# Patient Record
Sex: Female | Born: 1950 | Race: Black or African American | Hispanic: No | Smoking: Current every day smoker
Health system: Southern US, Community
[De-identification: ages and names within clinical notes are randomized; demographics above are authoritative.]

---

## 2019-11-18 ENCOUNTER — Other Ambulatory Visit: Payer: Self-pay | Admitting: Family Medicine

## 2019-11-18 DIAGNOSIS — R921 Mammographic calcification found on diagnostic imaging of breast: Secondary | ICD-10-CM

## 2020-01-01 ENCOUNTER — Encounter (HOSPITAL_BASED_OUTPATIENT_CLINIC_OR_DEPARTMENT_OTHER): Payer: Medicare Other | Attending: Internal Medicine | Admitting: Internal Medicine

## 2020-01-01 DIAGNOSIS — E1142 Type 2 diabetes mellitus with diabetic polyneuropathy: Secondary | ICD-10-CM | POA: Diagnosis not present

## 2020-01-01 DIAGNOSIS — L97229 Non-pressure chronic ulcer of left calf with unspecified severity: Secondary | ICD-10-CM | POA: Insufficient documentation

## 2020-01-01 DIAGNOSIS — E1151 Type 2 diabetes mellitus with diabetic peripheral angiopathy without gangrene: Secondary | ICD-10-CM | POA: Insufficient documentation

## 2020-01-01 DIAGNOSIS — I5032 Chronic diastolic (congestive) heart failure: Secondary | ICD-10-CM | POA: Insufficient documentation

## 2020-01-01 DIAGNOSIS — F17200 Nicotine dependence, unspecified, uncomplicated: Secondary | ICD-10-CM | POA: Insufficient documentation

## 2020-01-01 DIAGNOSIS — L97212 Non-pressure chronic ulcer of right calf with fat layer exposed: Secondary | ICD-10-CM | POA: Diagnosis present

## 2020-01-01 DIAGNOSIS — J449 Chronic obstructive pulmonary disease, unspecified: Secondary | ICD-10-CM | POA: Insufficient documentation

## 2020-01-01 DIAGNOSIS — I110 Hypertensive heart disease with heart failure: Secondary | ICD-10-CM | POA: Insufficient documentation

## 2020-01-01 NOTE — Progress Notes (Signed)
XOE, HOE ([id number]) Visit Report for 01/01/2020 Abuse/Suicide Risk Screen Details Patient Name: Date of Service: Patricia [name], Patricia [name] 01/01/2020 2:45 PM Medical Record Number: [id number] Patient Account Number: [id number] Date of Birth/Sex: Treating RN: [birth year] (69 y.o. Harvest Dark Primary Care Jennier Schissler: Other Clinician: Referring Dariela Stoker: Treating Tayler Lassen/Extender: Maryla Morrow in Treatment: 0 Abuse/Suicide Risk Screen Items Answer ABUSE RISK SCREEN: Has anyone close to you tried to hurt or harm you recentlyo No Do you feel uncomfortable with anyone in your familyo No Has anyone forced you do things that you didnt want to doo No Electronic Signature(s) Signed: 01/01/2020 5:05:13 PM By: Cherylin Mylar Entered By: Cherylin Mylar on 01/01/2020 15:25:21 -------------------------------------------------------------------------------- Activities of Daily Living Details Patient Name: Date of Service: Patricia [name], Patricia [name] 01/01/2020 2:45 PM Medical Record Number: [id number] Patient Account Number: [id number] Date of Birth/Sex: Treating RN: 02-04-1951 (69 y.o. Harvest Dark Primary Care Ulice Follett: Other Clinician: Referring Demarko Zeimet: Treating Ahtziri Jeffries/Extender: Maryla Morrow in Treatment: 0 Activities of Daily Living Items Answer Activities of Daily Living (Please select one for each item) Drive Automobile Not Able T Medications ake Completely Able Use T elephone Completely Able Care for Appearance Completely Able Use T oilet Completely Able Bath / Shower Completely Able Dress Self Completely Able Feed Self Completely Able Walk Completely Able Get In / Out Bed Completely Able Housework Completely Able Prepare Meals Completely Able Handle Money Completely Able Shop for Self Completely Able Electronic Signature(s) Signed: 01/01/2020 5:05:13 PM By: Cherylin Mylar Entered By: Cherylin Mylar on 01/01/2020  15:25:49 -------------------------------------------------------------------------------- Education Screening Details Patient Name: Date of Service: Patricia [name], Patricia [name] 01/01/2020 2:45 PM Medical Record Number: [id number] Patient Account Number: [id number] Date of Birth/Sex: Treating RN: 11/03/1950 (69 y.o. F) Cherylin Mylar Primary Care Kayleeann Huxford: Other Clinician: Referring Brittane Grudzinski: Treating Rasmus Preusser/Extender: Maryla Morrow in Treatment: 0 Primary Learner Assessed: Patient Learning Preferences/Education Level/Primary Language Learning Preference: Explanation Preferred Language: English Cognitive Barrier Language Barrier: No Translator Needed: No Memory Deficit: No Emotional Barrier: No Cultural/Religious Beliefs Affecting Medical Care: No Physical Barrier Impaired Vision: No Impaired Hearing: No Decreased Hand dexterity: No Knowledge/Comprehension Knowledge Level: Medium Comprehension Level: Medium Ability to understand written instructions: Medium Ability to understand verbal instructions: Medium Motivation Anxiety Level: Calm Cooperation: Cooperative Education Importance: Acknowledges Need Interest in Health Problems: Asks Questions Perception: Coherent Willingness to Engage in Self-Management High Activities: Readiness to Engage in Self-Management High Activities: Electronic Signature(s) Signed: 01/01/2020 5:05:13 PM By: Cherylin Mylar Entered By: Cherylin Mylar on 01/01/2020 15:26:08 -------------------------------------------------------------------------------- Fall Risk Assessment Details Patient Name: Date of Service: Patricia [name] 01/01/2020 2:45 PM Medical Record Number: [id number] Patient Account Number: [id number] Date of Birth/Sex: Treating RN: November 06, 1950 (69 y.o. F) Dwiggins, Carollee Herter Primary Care Timothee Gali: Other Clinician: Referring Feather Berrie: Treating Melven Stockard/Extender: Maryla Morrow in Treatment: 0 Fall Risk Assessment  Items Have you had 2 or more falls in the last 12 monthso 0 Yes Have you had any fall that resulted in injury in the last 12 monthso 0 Yes FALLS RISK SCREEN History of falling - immediate or within 3 months 25 Yes Secondary diagnosis (Do you have 2 or more medical diagnoseso) 15 Yes Ambulatory aid None/bed rest/wheelchair/nurse 0 No Crutches/cane/walker 15 Yes Furniture 0 No Intravenous therapy Access/Saline/Heparin Lock 0 No Gait/Transferring Normal/ bed rest/ wheelchair 0 No Weak (short steps with or without shuffle, stooped but able to lift head while walking, may seek 10 Yes support from furniture) Impaired (short steps with shuffle, may have difficulty arising from chair, head down, impaired 0 No  balance) Mental Status Oriented to own ability 0 Yes Electronic Signature(s) Signed: 01/01/2020 5:05:13 PM By: Cherylin Mylar Entered By: Cherylin Mylar on 01/01/2020 15:26:48 -------------------------------------------------------------------------------- Foot Assessment Details Patient Name: Date of Service: Patricia [name], Patricia [name] 01/01/2020 2:45 PM Medical Record Number: [id number] Patient Account Number: [id number] Date of Birth/Sex: Treating RN: 07-27-1950 (69 y.o. Harvest Dark Primary Care Markiah Janeway: Other Clinician: Referring Revanth Neidig: Treating Clarissa Laird/Extender: Maryla Morrow in Treatment: 0 Foot Assessment Items Site Locations + = Sensation present, - = Sensation absent, C = Callus, U = Ulcer R = Redness, W = Warmth, M = Maceration, PU = Pre-ulcerative lesion F = Fissure, S = Swelling, D = Dryness Assessment Right: Left: Other Deformity: No No Prior Foot Ulcer: No No Prior Amputation: No No Charcot Joint: No No Ambulatory Status: Ambulatory With Help Assistance Device: Walker Gait: Surveyor, mining) Signed: 01/01/2020 5:05:13 PM By: Cherylin Mylar Entered By: Cherylin Mylar on 01/01/2020  15:27:28 -------------------------------------------------------------------------------- Nutrition Risk Screening Details Patient Name: Date of Service: Patricia [name], Patricia [name] 01/01/2020 2:45 PM Medical Record Number: [id number] Patient Account Number: [id number] Date of Birth/Sex: Treating RN: June 14, 1950 (69 y.o. F) Dwiggins, Carollee Herter Primary Care Navid Lenzen: Other Clinician: Referring Talonda Artist: Treating Dontrez Pettis/Extender: Maryla Morrow in Treatment: 0 Height (in): 64 Weight (lbs): 172 Body Mass Index (BMI): 29.5 Nutrition Risk Screening Items Score Screening NUTRITION RISK SCREEN: I have an illness or condition that made me change the kind and/or amount of food I eat 0 No I eat fewer than two meals per day 0 No I eat few fruits and vegetables, or milk products 0 No I have three or more drinks of beer, liquor or wine almost every day 0 No I have tooth or mouth problems that make it hard for me to eat 0 No I don't always have enough money to buy the food I need 0 No I eat alone most of the time 0 No I take three or more different prescribed or over-the-counter drugs a day 0 No Without wanting to, I have lost or gained 10 pounds in the last six months 0 No I am not always physically able to shop, cook and/or feed myself 0 No Nutrition Protocols Good Risk Protocol Moderate Risk Protocol High Risk Proctocol Risk Level: Good Risk Score: 0 Electronic Signature(s) Signed: 01/01/2020 5:05:13 PM By: Cherylin Mylar Entered By: Cherylin Mylar on 01/01/2020 15:26:55

## 2020-01-02 NOTE — Progress Notes (Signed)
Patricia [name], Patricia [name] ([id number]) Visit Report for 01/01/2020 Chief Complaint Document Details Patient Name: Date of Service: Patricia [name], Patricia [name] 01/01/2020 2:45 PM Medical Record Number: [id number] Patient Account Number: [id number] Date of Birth/Sex: Treating RN: Jul 10, 1950 (69 y.o. Wynelle Link Primary Care Provider: Other Clinician: Referring Provider: Treating Provider/Extender: Maryla Morrow in Treatment: 0 Information Obtained from: Patient Chief Complaint Right LE wound x 1 year, worse 3 x new wounds b/l LE x 3 months Electronic Signature(s) Signed: 01/01/2020 4:38:17 PM By: Cassandria Anger MD, MBA Entered By: Cassandria Anger on 01/01/2020 16:38:16 -------------------------------------------------------------------------------- Debridement Details Patient Name: Date of Service: Patricia [name], Patricia [name] 01/01/2020 2:45 PM Medical Record Number: [id number] Patient Account Number: [id number] Date of Birth/Sex: Treating RN: 17-Oct-1950 (69 y.o. Wynelle Link Primary Care Provider: Other Clinician: Referring Provider: Treating Provider/Extender: Maryla Morrow in Treatment: 0 Debridement Performed for Assessment: Wound #4 Right,Medial Lower Leg Performed By: Physician Cassandria Anger, MD Debridement Type: Debridement Severity of Tissue Pre Debridement: Fat layer exposed Level of Consciousness (Pre-procedure): Awake and Alert Pre-procedure Verification/Time Out Yes - 16:25 Taken: Start Time: 16:25 T Area Debrided (L x W): otal 2.4 (cm) x 1.9 (cm) = 4.56 (cm) Tissue and other material debrided: Non-Viable, Eschar, Slough, Slough Level: Non-Viable Tissue Debridement Description: Selective/Open Wound Instrument: Blade, Forceps Bleeding: None End Time: 16:28 Procedural Pain: 4 Post Procedural Pain: 2 Response to Treatment: Procedure was tolerated well Level of Consciousness (Post- Awake and Alert procedure): Post Debridement Measurements of Total Wound Length:  (cm) 2.4 Width: (cm) 1.9 Depth: (cm) 0.9 Volume: (cm) 3.223 Character of Wound/Ulcer Post Debridement: Requires Further Debridement Severity of Tissue Post Debridement: Fat layer exposed Post Procedure Diagnosis Same as Pre-procedure Electronic Signature(s) Signed: 01/01/2020 5:03:09 PM By: Cassandria Anger MD, MBA Signed: 01/02/2020 5:19:54 PM By: Zandra Abts RN, BSN Entered By: Zandra Abts on 01/01/2020 16:29:05 -------------------------------------------------------------------------------- Debridement Details Patient Name: Date of Service: Patricia [name], Patricia [name] 01/01/2020 2:45 PM Medical Record Number: [id number] Patient Account Number: [id number] Date of Birth/Sex: Treating RN: [birth year] (69 y.o. Wynelle Link Primary Care Provider: Other Clinician: Referring Provider: Treating Provider/Extender: Maryla Morrow in Treatment: 0 Debridement Performed for Assessment: Wound #1 Left,Anterior Lower Leg Performed By: Clinician Zandra Abts, RN Debridement Type: Chemical/Enzymatic/Mechanical Agent Used: Santyl Severity of Tissue Pre Debridement: Fat layer exposed Level of Consciousness (Pre-procedure): Awake and Alert Pre-procedure Verification/Time Out No Taken: Bleeding: None Response to Treatment: Procedure was tolerated well Level of Consciousness (Post- Awake and Alert procedure): Post Debridement Measurements of Total Wound Length: (cm) 0.9 Width: (cm) 0.8 Depth: (cm) 0.2 Volume: (cm) 0.113 Character of Wound/Ulcer Post Debridement: Requires Further Debridement Severity of Tissue Post Debridement: Fat layer exposed Post Procedure Diagnosis Same as Pre-procedure Electronic Signature(s) Signed: 01/02/2020 9:39:52 AM By: Cassandria Anger MD, MBA Signed: 01/02/2020 5:19:54 PM By: Zandra Abts RN, BSN Entered By: Zandra Abts on 01/01/2020 17:06:51 -------------------------------------------------------------------------------- Debridement  Details Patient Name: Date of Service: Patricia [name], Patricia [name] 01/01/2020 2:45 PM Medical Record Number: [id number] Patient Account Number: [id number] Date of Birth/Sex: Treating RN: 20-Aug-1950 (69 y.o. Wynelle Link Primary Care Provider: Other Clinician: Referring Provider: Treating Provider/Extender: Maryla Morrow in Treatment: 0 Debridement Performed for Assessment: Wound #2 Right,Dorsal Foot Performed By: Clinician Zandra Abts, RN Debridement Type: Chemical/Enzymatic/Mechanical Agent Used: Santyl Severity of Tissue Pre Debridement: Fat layer exposed Level of Consciousness (Pre-procedure): Awake and Alert Pre-procedure Verification/Time Out No Taken: Bleeding: None Response to Treatment: Procedure was tolerated well Level of Consciousness (Post- Awake and Alert procedure): Post Debridement Measurements of  Total Wound Length: (cm) 0.9 Width: (cm) 0.9 Depth: (cm) 0.2 Volume: (cm) 0.127 Character of Wound/Ulcer Post Debridement: Requires Further Debridement Severity of Tissue Post Debridement: Fat layer exposed Post Procedure Diagnosis Same as Pre-procedure Electronic Signature(s) Signed: 01/02/2020 9:39:52 AM By: Cassandria Anger MD, MBA Signed: 01/02/2020 5:19:54 PM By: Zandra Abts RN, BSN Entered By: Zandra Abts on 01/01/2020 17:07:11 -------------------------------------------------------------------------------- Debridement Details Patient Name: Date of Service: Patricia [name], Patricia [name] 01/01/2020 2:45 PM Medical Record Number: [id number] Patient Account Number: [id number] Date of Birth/Sex: Treating RN: 1950-09-09 (69 y.o. Wynelle Link Primary Care Provider: Other Clinician: Referring Provider: Treating Provider/Extender: Maryla Morrow in Treatment: 0 Debridement Performed for Assessment: Wound #3 Right,Distal,Anterior Lower Leg Performed By: Clinician Zandra Abts, RN Debridement Type: Chemical/Enzymatic/Mechanical Agent Used:  Santyl Severity of Tissue Pre Debridement: Fat layer exposed Level of Consciousness (Pre-procedure): Awake and Alert Pre-procedure Verification/Time Out No Taken: Bleeding: None Response to Treatment: Procedure was tolerated well Level of Consciousness (Post- Awake and Alert procedure): Post Debridement Measurements of Total Wound Length: (cm) 0.5 Width: (cm) 0.8 Depth: (cm) 0.2 Volume: (cm) 0.063 Character of Wound/Ulcer Post Debridement: Requires Further Debridement Severity of Tissue Post Debridement: Fat layer exposed Post Procedure Diagnosis Same as Pre-procedure Electronic Signature(s) Signed: 01/02/2020 9:39:52 AM By: Cassandria Anger MD, MBA Signed: 01/02/2020 5:19:54 PM By: Zandra Abts RN, BSN Entered By: Zandra Abts on 01/01/2020 17:07:37 -------------------------------------------------------------------------------- HPI Details Patient Name: Date of Service: Patricia [name], Patricia [name] 01/01/2020 2:45 PM Medical Record Number: [id number] Patient Account Number: [id number] Date of Birth/Sex: Treating RN: Mar 13, 1951 (69 y.o. 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 Patient has most symptoms from the right medial calf wound with pain and discomfort. She has been putting Neosporin ointment on this wound and on the other wounds on the right and left legs. ABI in the clinic was unobtainable Patient's history significant for type 2 diabetes poorly controlled with the latest A1c of 11.8, ongoing  tobacco abuse, COPD-non-O2 dependent, CHF/chronic diastolic heart failure, Patient denies any fevers or chills, denies any other symptoms other than discomfort in the legs due to the wounds. Electronic Signature(s) Signed: 01/01/2020 4:40:28 PM By: Cassandria Anger MD, MBA Entered By: Cassandria Anger on 01/01/2020 16:40:28 -------------------------------------------------------------------------------- Physical Exam Details Patient Name: Date of Service: Patricia [name], Patricia [name] 01/01/2020 2:45 PM Medical Record Number: [id number] Patient Account Number: [id number] Date of Birth/Sex: Treating RN: January 11, 1951 (69 y.o. Wynelle Link Primary Care Provider: Other Clinician: Referring Provider: Treating Provider/Extender: Maryla Morrow in Treatment: 0 Constitutional alert and oriented x 3. sitting or standing blood pressure is within target range for patient.. supine blood pressure is within target range for patient.. pulse regular and within target range for patient.Marland Kitchen respirations regular, non-labored and within target range for patient.Marland Kitchen temperature within target range for patient.. . . Well- nourished and well-hydrated in no acute distress. Eyes conjunctiva clear no eyelid edema noted. pupils equal round and reactive to light and accommodation. Ears, Nose, Mouth, and Throat no gross abnormality of ear auricles or external auditory canals. normal hearing noted  during conversation. mucus membranes moist. Neck supple with no LAD noted in anterior or posterior cervical chain. not enlarged. Respiratory normal breathing without difficulty. clear to auscultation bilaterally. Cardiovascular regular rate and rhythm with normal S1, S2. no bruits with no significant JVD. 2+ femoral pulses. Poorly palpable pulses in either leg. no clubbing, cyanosis, significant edema, <3 sec cap refill. Gastrointestinal (GI) soft, non-tender, non-distended, +BS. no hepatosplenomegaly. no ventral hernia  noted. Musculoskeletal normal gait and posture. no significant deformity or arthritic changes, no loss or range of motion, no clubbing. full range of motion without deformity. full range of motion without deformity. full range of motion with greater than 10 degrees of flexion of the ankle. full range of motion with greater than 10 degrees of flexion of the ankle. Integumentary (Hair, Skin) normal hair distribution and pattern. skin pink, warm, dry. Neurological cranial nerves 2-12 intact. Patient has normal sensation in the feet bilaterally to light touch. Psychiatric this patient is able to make decisions and demonstrates good insight into disease process. Alert and Oriented x 3. pleasant and cooperative. Notes Patient has right medial calf wound that has eschar of the top half that I try to remove with a curette and then with scalpel was not tolerant of these procedures due to his significant pain, some of the scar was removed. Dorsal base of toe ulcer on the right great toe, 2 other wounds on the left medial calf. Electronic Signature(s) Signed: 01/01/2020 5:00:35 PM By: Cassandria Anger MD, MBA Entered By: Cassandria Anger on 01/01/2020 17:00:34 -------------------------------------------------------------------------------- Physician Orders Details Patient Name: Date of Service: Patricia [name], Patricia [name] 01/01/2020 2:45 PM Medical Record Number: [id number] Patient Account Number: [id number] Date of Birth/Sex: Treating RN: Nov 13, 1950 (69 y.o. Wynelle Link Primary Care Provider: Other Clinician: Referring Provider: Treating Provider/Extender: Maryla Morrow in Treatment: 0 Verbal / Phone Orders: No Diagnosis Coding Follow-up Appointments Return Appointment in 1 week. Dressing Change Frequency Change dressing every day. - all wounds - home health to change twice a week, pt to change all other days Wound Cleansing May shower and wash wound with soap and water. Primary Wound  Dressing Wound #1 Left,Anterior Lower Leg Santyl Ointment - may use Medihoney if Santyl unavailable Wound #2 Right,Dorsal Foot Santyl Ointment - may use Medihoney if Santyl unavailable Wound #3 Right,Distal,Anterior Lower Leg Santyl Ointment - may use Medihoney if Santyl unavailable Wound #4 Right,Medial Lower Leg Santyl Ointment - may use Medihoney if Santyl unavailable Secondary Dressing Wound #1 Left,Anterior Lower Leg Foam Border - or gauze and tape Wound #2 Right,Dorsal Foot Foam Border - or gauze and tape Wound #3 Right,Distal,Anterior Lower Leg Foam Border - or gauze and tape Wound #4 Right,Medial Lower Leg Foam Border - or gauze and tape Home Health Continue Home Health skilled nursing for wound care. Government social research officer (Fax: [phone]) Phone: [phone] Services and Therapies rterial Studies- Bilateral with ABIs and TBIs - **URGENT** - Necrotic, non healing ulcers on bilateral lower legs/feet, non palpable pulses A Patient Medications llergies: Panlor (hydrocodone-acetamin) A Notifications Medication Indication Start End 01/02/2020 Santyl DOSE topical 250 unit/gram ointment - ointment topical apply daily to wound with dressing change Electronic Signature(s) Signed: 01/02/2020 9:07:17 AM By: Cassandria Anger MD, MBA Previous Signature: 01/01/2020 5:03:09 PM Version By: Cassandria Anger MD, MBA Entered By: Cassandria Anger on 01/02/2020 09:07:17 Prescription 01/01/2020 -------------------------------------------------------------------------------- Neoma Laming MD Patient Name: Provider: [birth year] [id number] Date of Birth: NPI#Arvella Merles Sex: DEA #: [phone] Phone #: License #: Eligha Bridegroom [name] Patient Address: [address] [place], [place] [address] [address] [place], [place] [address] [phone] Allergies Panlor (hydrocodone-acetamin) Provider's Orders rterial Studies-  Bilateral with ABIs and TBIs - **URGENT** - Necrotic, non healing ulcers on bilateral lower legs/feet, non palpable pulses A Hand Signature: Date(s): Electronic Signature(s) Signed: 01/02/2020 9:39:52 AM By: Cassandria Anger MD, MBA Previous Signature: 01/01/2020 5:03:09 PM Version By: Cassandria Anger MD, MBA Entered By: Cassandria Anger on 01/02/2020 09:07:18 -------------------------------------------------------------------------------- Problem List Details Patient Name: Date of Service: Patricia [name], Patricia [name] 01/01/2020 2:45 PM Medical Record Number: [id number] Patient Account Number: [id number] Date of Birth/Sex: Treating RN: [birth year] (69 y.o. 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 Patient has most symptoms from the right medial calf wound with pain and discomfort. She has been putting Neosporin ointment on this wound and on the other wounds on the right and left legs. ABI in the clinic was unobtainable Patient's history significant for type 2 diabetes poorly controlled with the latest A1c of 11.8, ongoing tobacco abuse, COPD-non-O2 dependent, CHF/chronic diastolic heart failure, Patient denies any fevers or chills, denies any other symptoms other than discomfort in the legs due to the wounds. Patient History Allergies Panlor (hydrocodone-acetamin) (Reaction: itching) Family History Cancer - Mother,Siblings,Maternal Grandparents, Diabetes - Mother,Maternal Grandparents, Heart Disease - Father,Paternal Grandparents, Hypertension - Father,Paternal Grandparents, Kidney Disease - Siblings, Lung Disease - Siblings,Father,Paternal Grandparents, Stroke - Father,Mother,Paternal Grandparents,Maternal Grandparents, No family history of Hereditary Spherocytosis, Seizures, Thyroid Problems, Tuberculosis. Social History Current every day smoker, Marital Status - Divorced, Alcohol Use - Never, Drug Use - Prior History, Caffeine Use - Daily. Medical History Eyes Patient has history of Cataracts - cataracts Denies history of Glaucoma, Optic Neuritis Ear/Nose/Mouth/Throat Denies history of Chronic sinus problems/congestion, Middle ear problems Hematologic/Lymphatic Denies history of Anemia, Hemophilia, Human Immunodeficiency Virus, Lymphedema, Sickle Cell Disease Respiratory Patient has history of Chronic Obstructive Pulmonary Disease (COPD) Denies  history of Aspiration, Asthma, Pneumothorax, Sleep Apnea, Tuberculosis Cardiovascular Patient has history of Congestive Heart Failure, Hypertension, Peripheral Venous Disease Endocrine Patient has history of Type II Diabetes Integumentary (Skin) Denies history of History of Burn Neurologic Patient has history of Neuropathy - hands and  feet Oncologic Denies history of Received Chemotherapy, Received Radiation Patient is treated with Oral Agents. Blood sugar is tested. Medical A Surgical History Notes nd Musculoskeletal degenerative disc disease Review of Systems (ROS) Constitutional Symptoms (General Health) Denies complaints or symptoms of Fatigue, Fever, Chills, Marked Weight Change. Ear/Nose/Mouth/Throat Denies complaints or symptoms of Chronic sinus problems or rhinitis. Respiratory Denies complaints or symptoms of Chronic or frequent coughs, Shortness of Breath. Cardiovascular Denies complaints or symptoms of Chest pain. Gastrointestinal Denies complaints or symptoms of Frequent diarrhea, Nausea, Vomiting. Genitourinary Denies complaints or symptoms of Frequent urination. Integumentary (Skin) Complains or has symptoms of Wounds - legs/feet. Psychiatric Denies complaints or symptoms of Claustrophobia, Suicidal. Objective Constitutional alert and oriented x 3. sitting or standing blood pressure is within target range for patient.. supine blood pressure is within target range for patient.. pulse regular and within target range for patient.Marland Kitchen respirations regular, non-labored and within target range for patient.Marland Kitchen temperature within target range for patient.. Well- nourished and well-hydrated in no acute distress. Vitals Time Taken: 3:11 PM, Height: 64 in, Source: Stated, Weight: 172 lbs, Source: Stated, BMI: 29.5, Temperature: 97.7 F, Pulse: 67 bpm, Respiratory Rate: 18 breaths/min, Blood Pressure: 138/65 mmHg. General Notes: patient stated she did not check her CBG this  morning Eyes conjunctiva clear no eyelid edema noted. pupils equal round and reactive to light and accommodation. Ears, Nose, Mouth, and Throat no gross abnormality of ear auricles or external auditory canals. normal hearing noted during conversation. mucus membranes moist. Neck supple with no LAD noted in anterior or posterior cervical chain. not enlarged. Respiratory normal breathing without difficulty. clear to auscultation bilaterally. Cardiovascular regular rate and rhythm with normal S1, S2. no bruits with no significant JVD. 2+ femoral pulses. Poorly palpable pulses in either leg. no clubbing, cyanosis, significant edema, Gastrointestinal (GI) soft, non-tender, non-distended, +BS. no hepatosplenomegaly. no ventral hernia noted. Musculoskeletal normal gait and posture. no significant deformity or arthritic changes, no loss or range of motion, no clubbing. full range of motion without deformity. full range of motion without deformity. full range of motion with greater than 10 degrees of flexion of the ankle. full range of motion with greater than 10 degrees of flexion of the ankle. Neurological cranial nerves 2-12 intact. Patient has normal sensation in the feet bilaterally to light touch. Psychiatric this patient is able to make decisions and demonstrates good insight into disease process. Alert and Oriented x 3. pleasant and cooperative. General Notes: Patient has right medial calf wound that has eschar of the top half that I try to remove with a curette and then with scalpel was not tolerant of these procedures due to his significant pain, some of the scar was removed. Dorsal base of toe ulcer on the right great toe, 2 other wounds on the left medial calf. Integumentary (Hair, Skin) normal hair distribution and pattern. skin pink, warm, dry. Wound #1 status is Open. Original cause of wound was Gradually Appeared. The wound is located on the Left,Anterior Lower Leg. The wound  measures 0.9cm length x 0.8cm width x 0.2cm depth; 0.565cm^2 area and 0.113cm^3 volume. There is Fat Layer (Subcutaneous Tissue) Exposed exposed. There is no tunneling or undermining noted. There is a medium amount of serous drainage noted. The wound margin is well defined and not attached to the wound base. There is medium (34-66%) pink granulation within the wound bed. There is a medium (34-66%) amount of necrotic tissue within the wound bed including Adherent Slough. Wound #2 status is Open. Original  cause of wound was Gradually Appeared. The wound is located on the Right,Dorsal Foot. The wound measures 0.9cm length x 0.9cm width x 0.2cm depth; 0.636cm^2 area and 0.127cm^3 volume. There is Fat Layer (Subcutaneous Tissue) Exposed exposed. There is no tunneling or undermining noted. There is a small amount of serous drainage noted. The wound margin is well defined and not attached to the wound base. There is medium (34-66%) pink granulation within the wound bed. There is a medium (34-66%) amount of necrotic tissue within the wound bed including Adherent Slough. Wound #3 status is Open. Original cause of wound was Gradually Appeared. The wound is located on the Right,Distal,Anterior Lower Leg. The wound measures 0.5cm length x 0.8cm width x 0.2cm depth; 0.314cm^2 area and 0.063cm^3 volume. There is Fat Layer (Subcutaneous Tissue) Exposed exposed. There is no tunneling or undermining noted. There is a small amount of serous drainage noted. The wound margin is well defined and not attached to the wound base. There is small (1-33%) pink granulation within the wound bed. There is a large (67-100%) amount of necrotic tissue within the wound bed including Adherent Slough. Wound #4 status is Open. Original cause of wound was Gradually Appeared. The wound is located on the Right,Medial Lower Leg. The wound measures 2.4cm length x 1.9cm width x 0.9cm depth; 3.581cm^2 area and 3.223cm^3 volume. There is Fat Layer  (Subcutaneous Tissue) Exposed exposed. There is no tunneling or undermining noted. There is a medium amount of serous drainage noted. The wound margin is well defined and not attached to the wound base. There is small (1-33%) pink granulation within the wound bed. There is a large (67-100%) amount of necrotic tissue within the wound bed including Eschar and Adherent Slough. 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 There was a Selective/Open Wound Non-Viable Tissue Debridement with a total area of 4.56 sq cm performed by Cassandria Anger, MD. With the following instrument(s): Blade, and Forceps to remove Non-Viable tissue/material. Material removed includes Eschar and Slough and. No specimens were taken. A time out was conducted at 16:25, prior to the start of the procedure. There was no bleeding. The procedure was tolerated well with a pain level of 4 throughout and a pain level of 2 following the procedure. Post Debridement Measurements: 2.4cm length x 1.9cm width x 0.9cm depth; 3.223cm^3 volume. Character of Wound/Ulcer Post Debridement requires further debridement. Severity of Tissue Post Debridement is: Fat layer exposed. Post procedure Diagnosis Wound #4: Same as Pre-Procedure Plan Follow-up Appointments: Return Appointment in 1 week. Dressing Change Frequency: Change dressing every day. - all wounds - home health to change twice a week, pt to change all other days Wound Cleansing: May shower and wash wound with soap and water. Primary Wound Dressing: Wound #1 Left,Anterior Lower Leg: Santyl Ointment - may use Medihoney if Santyl  unavailable Wound #2 Right,Dorsal Foot: Santyl Ointment - may use Medihoney if Santyl unavailable Wound #3 Right,Distal,Anterior Lower Leg: Santyl Ointment - may use Medihoney if Santyl unavailable Wound #4 Right,Medial Lower Leg: Santyl Ointment - may use Medihoney if Santyl unavailable Secondary Dressing: Wound #1 Left,Anterior Lower Leg: Foam Border - or gauze and tape Wound #2 Right,Dorsal Foot: Foam Border - or gauze and  tape Wound #3 Right,Distal,Anterior Lower Leg: Foam Border - or gauze and tape Wound #4 Right,Medial Lower Leg: Foam Border - or gauze and tape Home Health: Continue Home Health skilled nursing for wound care. - [employer] and Therapies ordered were: Arterial Studies- Bilateral with ABIs and TBIs - **URGENT** - Necrotic, non healing ulcers on bilateral lower legs/feet, non palpable pulses -We will start with Santyl to all the wounds with daily dressing changes, no evidence of cellulitis or need for systemic antibiotics at this point -Patient will be referred for arterial Doppler studies with left ABI of 0.45 and right unobtainable likelihood of peripheral arterial disease is high -Patient has been advised to seek immediate attention with her primary care physician to improve her diabetes control with the last known A1c of 11.8 this leaves much to be desired -Patient has also been advised to stop smoking she smokes a pack a day -Return to clinic next week Electronic Signature(s) Signed: 01/01/2020 5:02:20 PM By: Cassandria Anger MD, MBA Entered By: Cassandria Anger on 01/01/2020 17:02:20 -------------------------------------------------------------------------------- HxROS Details Patient Name: Date of Service: Patricia [name] 01/01/2020 2:45 PM Medical Record Number: [id number] Patient Account Number: [id number] Date of Birth/Sex: Treating RN: 1950-11-13 (69 y.o. Harvest Dark Primary Care Provider: Other Clinician: Referring Provider: Treating  Provider/Extender: Maryla Morrow in Treatment: 0 Constitutional Symptoms (General Health) Complaints and Symptoms: Negative for: Fatigue; Fever; Chills; Marked Weight Change Ear/Nose/Mouth/Throat Complaints and Symptoms: Negative for: Chronic sinus problems or rhinitis Medical History: Negative for: Chronic sinus problems/congestion; Middle ear problems Respiratory Complaints and Symptoms: Negative for: Chronic or frequent coughs; Shortness of Breath Medical History: Positive for: Chronic Obstructive Pulmonary Disease (COPD) Negative for: Aspiration; Asthma; Pneumothorax; Sleep Apnea; Tuberculosis Cardiovascular Complaints and Symptoms: Negative for: Chest pain Medical History: Positive for: Congestive Heart Failure; Hypertension; Peripheral Venous Disease Gastrointestinal Complaints and Symptoms: Negative for: Frequent diarrhea; Nausea; Vomiting Genitourinary Complaints and Symptoms: Negative for: Frequent urination Integumentary (Skin) Complaints and Symptoms: Positive for: Wounds - legs/feet Medical History: Negative for: History of Burn Psychiatric Complaints and Symptoms: Negative for: Claustrophobia; Suicidal Eyes Medical History: Positive for: Cataracts - cataracts Negative for: Glaucoma; Optic Neuritis Hematologic/Lymphatic Medical History: Negative for: Anemia; Hemophilia; Human Immunodeficiency Virus; Lymphedema; Sickle Cell Disease Endocrine Medical History: Positive for: Type II Diabetes Time with diabetes: 2003 Treated with: Oral agents Blood sugar tested every day: Yes Tested : Immunological Musculoskeletal Medical History: Past Medical History Notes: degenerative disc disease Neurologic Medical History: Positive for: Neuropathy - hands and feet Oncologic Medical History: Negative for: Received Chemotherapy; Received Radiation HBO Extended History Items Eyes: Cataracts Immunizations Pneumococcal Vaccine: Received Pneumococcal  Vaccination: Yes Implantable Devices Yes Family and Social History Cancer: Yes - Mother,Siblings,Maternal Grandparents; Diabetes: Yes - Mother,Maternal Grandparents; Heart Disease: Yes - Father,Paternal Grandparents; Hereditary Spherocytosis: No; Hypertension: Yes - Father,Paternal Grandparents; Kidney Disease: Yes - Siblings; Lung Disease: Yes - Siblings,Father,Paternal Grandparents; Seizures: No; Stroke: Yes - Father,Mother,Paternal Grandparents,Maternal Grandparents; Thyroid Problems: No; Tuberculosis: No; Current every day smoker; Marital Status - Divorced; Alcohol Use: Never; Drug Use: Prior History; Caffeine Use: Daily; Financial Concerns: Yes; Food, Clothing or Shelter Needs: No; Support System Lacking: No; Transportation Concerns: No Psychologist, prison and probation services) Signed: 01/01/2020 5:03:09 PM By: Cassandria Anger MD, MBA Signed: 01/01/2020 5:05:13 PM By: Cherylin Mylar Entered By: Cherylin Mylar on 01/01/2020 15:25:14 -------------------------------------------------------------------------------- SuperBill Details Patient Name: Date of Service: Patricia [name], Patricia [name] 01/01/2020 Medical Record Number: [id number] Patient Account Number: [id number] Date of Birth/Sex: Treating RN: 1951-04-02 (69 y.o. 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 Facility Procedures CPT4 Code: 19417408 Description: 99213 - WOUND CARE VISIT-LEV 3 EST PT Modifier: 25 Quantity: 1 CPT4 Code: [id number] Description: 97597 - DEBRIDE WOUND 1ST 20 SQ CM OR < ICD-10 Diagnosis Description L97.212 Non-pressure chronic ulcer of right calf with fat layer  exposed Modifier: Quantity: 1 Physician Procedures : CPT4 Code Description Modifier [id number] - WC PHYS LEVEL 4 - NEW PT 25 ICD-10 Diagnosis Description L97.212 Non-pressure chronic ulcer of right calf with fat layer exposed Quantity: 1 : [id number] 97597 - WC PHYS DEBR WO ANESTH 20 SQ CM ICD-10 Diagnosis Description L97.212 Non-pressure chronic ulcer of right calf with fat layer exposed Quantity: 1 Electronic Signature(s) Signed: 01/02/2020 9:39:52 AM By: Cassandria Anger MD, MBA Signed: 01/02/2020 5:19:54 PM By: Zandra Abts RN, BSN Previous Signature: 01/01/2020 5:02:36 PM Version By: Cassandria Anger MD, MBA Entered By: Zandra Abts on 01/01/2020 17:08:27

## 2020-01-02 NOTE — Progress Notes (Signed)
NAKASHA, MESAROS ([id number]) Visit Report for 01/01/2020 Allergy List Details Patient Name: Date of Service: Patricia [name], Patricia [name] 01/01/2020 2:45 PM Medical Record Number: [id number] Patient Account Number: [id number] Date of Birth/Sex: Treating RN: [birth year] (69 y.o. Harvest Dark Primary Care Carla Rashad: Other Clinician: Referring Theresa Wedel: Treating Reznor Ferrando/Extender: Maryla Morrow in Treatment: 0 Allergies Active Allergies Panlor (hydrocodone-acetamin) Reaction: itching Allergy Notes Electronic Signature(s) Signed: 01/01/2020 5:05:13 PM By: Cherylin Mylar Entered By: Cherylin Mylar on 01/01/2020 15:14:18 -------------------------------------------------------------------------------- Arrival Information Details Patient Name: Date of Service: Patricia [name], Patricia [name] 01/01/2020 2:45 PM Medical Record Number: [id number] Patient Account Number: [id number] Date of Birth/Sex: Treating RN: Nov 26, 1950 (69 y.o. Harvest Dark Primary Care Mitesh Rosendahl: Other Clinician: Referring Christen Bedoya: Treating Trust Leh/Extender: Maryla Morrow in Treatment: 0 Visit Information Patient Arrived: Walker Arrival Time: 15:07 Accompanied By: self Transfer Assistance: None Patient Identification Verified: Yes Secondary Verification Process Completed: Yes Patient Has Alerts: Yes Patient Alerts: Patient on Blood Thinner Electronic Signature(s) Signed: 01/01/2020 5:05:13 PM By: Cherylin Mylar Entered By: Cherylin Mylar on 01/01/2020 15:11:46 -------------------------------------------------------------------------------- Clinic Level of Care Assessment Details Patient Name: Date of Service: Patricia [name], Patricia [name] 01/01/2020 2:45 PM Medical Record Number: [id number] Patient Account Number: [id number] Date of Birth/Sex: Treating RN: Sep 04, 1950 (69 y.o. Wynelle Link Primary Care Aldan Camey: Other Clinician: Referring Zarya Lasseigne: Treating Yuval Rubens/Extender: Maryla Morrow in  Treatment: 0 Clinic Level of Care Assessment Items TOOL 1 Quantity Score X- 1 0 Use when EandM and Procedure is performed on INITIAL visit ASSESSMENTS - Nursing Assessment / Reassessment X- 1 20 General Physical Exam (combine w/ comprehensive assessment (listed just below) when performed on new pt. evals) X- 1 25 Comprehensive Assessment (HX, ROS, Risk Assessments, Wounds Hx, etc.) ASSESSMENTS - Wound and Skin Assessment / Reassessment []  - 0 Dermatologic / Skin Assessment (not related to wound area) ASSESSMENTS - Ostomy and/or Continence Assessment and Care []  - 0 Incontinence Assessment and Management []  - 0 Ostomy Care Assessment and Management (repouching, etc.) PROCESS - Coordination of Care X - Simple Patient / Family Education for ongoing care 1 15 []  - 0 Complex (extensive) Patient / Family Education for ongoing care X- 1 10 Staff obtains Chiropractor, Records, T Results / Process Orders est []  - 0 Staff telephones HHA, Nursing Homes / Clarify orders / etc []  - 0 Routine Transfer to another Facility (non-emergent condition) []  - 0 Routine Hospital Admission (non-emergent condition) X- 1 15 New Admissions / Manufacturing engineer / Ordering NPWT Apligraf, etc. , []  - 0 Emergency Hospital Admission (emergent condition) PROCESS - Special Needs []  - 0 Pediatric / Minor Patient Management []  - 0 Isolation Patient Management []  - 0 Hearing / Language / Visual special needs []  - 0 Assessment of Community assistance (transportation, D/C planning, etc.) []  - 0 Additional assistance / Altered mentation []  - 0 Support Surface(s) Assessment (bed, cushion, seat, etc.) INTERVENTIONS - Miscellaneous []  - 0 External ear exam []  - 0 Patient Transfer (multiple staff / Nurse, adult / Similar devices) []  - 0 Simple Staple / Suture removal (25 or less) []  - 0 Complex Staple / Suture removal (26 or more) []  - 0 Hypo/Hyperglycemic Management (do not check if billed  separately) X- 1 15 Ankle / Brachial Index (ABI) - do not check if billed separately Has the patient been seen at the hospital within the last three years: Yes Total Score: 100 Level Of Care: New/Established - Level 3 Electronic Signature(s) Signed: 01/02/2020 5:19:54 PM By: Zandra Abts RN, BSN Entered By: Zandra Abts on  01/01/2020 17:08:07 -------------------------------------------------------------------------------- Encounter Discharge Information Details Patient Name: Date of Service: Patricia [name], Patricia [name] 01/01/2020 2:45 PM Medical Record Number: [id number] Patient Account Number: [id number] Date of Birth/Sex: Treating RN: 11-25-1950 (69 y.o. Freddy Finner Primary Care Lether Tesch: Other Clinician: Referring Royden Bulman: Treating Dulcie Gammon/Extender: Maryla Morrow in Treatment: 0 Encounter Discharge Information Items Post Procedure Vitals Discharge Condition: Stable Temperature (F): 97.7 Ambulatory Status: Walker Pulse (bpm): 67 Discharge Destination: Home Respiratory Rate (breaths/min): 18 Transportation: Private Auto Blood Pressure (mmHg): 138/65 Accompanied By: self Schedule Follow-up Appointment: Yes Clinical Summary of Care: Patient Declined Electronic Signature(s) Signed: 01/01/2020 5:07:35 PM By: Yevonne Pax RN Entered By: Yevonne Pax on 01/01/2020 16:59:48 -------------------------------------------------------------------------------- Lower Extremity Assessment Details Patient Name: Date of Service: Patricia [name], Patricia [name] 01/01/2020 2:45 PM Medical Record Number: [id number] Patient Account Number: [id number] Date of Birth/Sex: Treating RN: May 21, 1951 (69 y.o. Harvest Dark Primary Care Annisa Mazzarella: Other Clinician: Referring Rondall Radigan: Treating Sapphire Tygart/Extender: Maryla Morrow in Treatment: 0 Edema Assessment Assessed: [Left: No] [Right: No] E[Left: dema] [Right: :] Calf Left: Right: Point of Measurement: 38 cm From Medial Instep 38 cm 37  cm Ankle Left: Right: Point of Measurement: 12 cm From Medial Instep 21.5 cm 21.5 cm Vascular Assessment Pulses: Dorsalis Pedis Palpable: [Left:No] [Right:No] Blood Pressure: Brachial: [Left:138] Ankle: [Left:Dorsalis Pedis: 62 0.45] Electronic Signature(s) Signed: 01/01/2020 5:05:13 PM By: Cherylin Mylar Entered By: Cherylin Mylar on 01/01/2020 15:36:10 -------------------------------------------------------------------------------- Multi-Disciplinary Care Plan Details Patient Name: Date of Service: Patricia [name], Patricia [name] 01/01/2020 2:45 PM Medical Record Number: [id number] Patient Account Number: [id number] Date of Birth/Sex: Treating RN: [birth year] (69 y.o. Wynelle Link Primary Care Lavayah Vita: Other Clinician: Referring Jovonda Selner: Treating Marvelous Bouwens/Extender: Maryla Morrow in Treatment: 0 Active Inactive Abuse / Safety / Falls / Self Care Management Nursing Diagnoses: Potential for falls Potential for injury related to falls Goals: Patient will not experience any injury related to falls Date Initiated: 01/01/2020 Target Resolution Date: 01/30/2020 Goal Status: Active Patient/caregiver will verbalize/demonstrate measures taken to prevent injury and/or falls Date Initiated: 01/01/2020 Target Resolution Date: 01/30/2020 Goal Status: Active Interventions: Assess Activities of Daily Living upon admission and as needed Assess fall risk on admission and as needed Assess: immobility, friction, shearing, incontinence upon admission and as needed Assess impairment of mobility on admission and as needed per policy Assess personal safety and home safety (as indicated) on admission and as needed Assess self care needs on admission and as needed Provide education on fall prevention Provide education on personal and home safety Notes: Nutrition Nursing Diagnoses: Impaired glucose control: actual or potential Potential for alteratiion in Nutrition/Potential for  imbalanced nutrition Goals: Patient/caregiver agrees to and verbalizes understanding of need to use nutritional supplements and/or vitamins as prescribed Date Initiated: 01/01/2020 Target Resolution Date: 01/30/2020 Goal Status: Active Patient/caregiver will maintain therapeutic glucose control Date Initiated: 01/01/2020 Target Resolution Date: 01/30/2020 Goal Status: Active Interventions: Assess HgA1c results as ordered upon admission and as needed Assess patient nutrition upon admission and as needed per policy Provide education on elevated blood sugars and impact on wound healing Provide education on nutrition Notes: Venous Leg Ulcer Nursing Diagnoses: Knowledge deficit related to disease process and management Potential for venous Insuffiency (use before diagnosis confirmed) Goals: Patient/caregiver will verbalize understanding of disease process and disease management Date Initiated: 01/01/2020 Target Resolution Date: 01/30/2020 Goal Status: Active Interventions: Assess peripheral edema status every visit. Compression as ordered Provide education on venous insufficiency Notes: Wound/Skin Impairment Nursing Diagnoses: Impaired tissue integrity Knowledge deficit related to smoking impact on wound healing Knowledge  deficit related to ulceration/compromised skin integrity Goals: Patient will demonstrate a reduced rate of smoking or cessation of smoking Date Initiated: 01/01/2020 Target Resolution Date: 01/30/2020 Goal Status: Active Patient/caregiver will verbalize understanding of skin care regimen Date Initiated: 01/01/2020 Target Resolution Date: 01/30/2020 Goal Status: Active Interventions: Assess patient/caregiver ability to obtain necessary supplies Assess patient/caregiver ability to perform ulcer/skin care regimen upon admission and as needed Assess ulceration(s) every visit Provide education on ulcer and skin care Notes: Electronic Signature(s) Signed: 01/02/2020  5:19:54 PM By: Zandra Abts RN, BSN Entered By: Zandra Abts on 01/01/2020 16:09:52 -------------------------------------------------------------------------------- Pain Assessment Details Patient Name: Date of Service: Patricia [name], Patricia [name] 01/01/2020 2:45 PM Medical Record Number: [id number] Patient Account Number: [id number] Date of Birth/Sex: Treating RN: [birth year] (69 y.o. Harvest Dark Primary Care Rosena Bartle: Other Clinician: Referring Taralynn Quiett: Treating Aylan Bayona/Extender: Maryla Morrow in Treatment: 0 Active Problems Location of Pain Severity and Description of Pain Patient Has Paino Yes Site Locations Pain Location: Pain Location: Pain in Ulcers With Dressing Change: Yes Duration of the Pain. Constant / Intermittento Constant Rate the pain. Current Pain Level: 4 Worst Pain Level: 9 Least Pain Level: 4 Tolerable Pain Level: 5 Character of Pain Describe the Pain: Burning, Sharp Pain Management and Medication Current Pain Management: Electronic Signature(s) Signed: 01/01/2020 5:05:13 PM By: Cherylin Mylar Entered By: Cherylin Mylar on 01/01/2020 15:49:54 -------------------------------------------------------------------------------- Patient/Caregiver Education Details Patient Name: Date of Service: Patricia [name] 8/12/2021andnbsp2:45 PM Medical Record Number: [id number] Patient Account Number: [id number] Date of Birth/Gender: Treating RN: 09/27/1950 (69 y.o. Wynelle Link Primary Care Physician: Other Clinician: Referring Physician: Treating Physician/Extender: Maryla Morrow in Treatment: 0 Education Assessment Education Provided To: Patient Education Topics Provided Elevated Blood Sugar/ Impact on Healing: Methods: Explain/Verbal Responses: State content correctly Nutrition: Methods: Explain/Verbal Responses: State content correctly Safety: Methods: Explain/Verbal Responses: State content correctly Venous: Methods:  Explain/Verbal Responses: State content correctly Wound/Skin Impairment: Methods: Explain/Verbal Responses: State content correctly Electronic Signature(s) Signed: 01/02/2020 5:19:54 PM By: Zandra Abts RN, BSN Entered By: Zandra Abts on 01/01/2020 16:10:17 -------------------------------------------------------------------------------- Wound Assessment Details Patient Name: Date of Service: Patricia [name], Patricia [name] 01/01/2020 2:45 PM Medical Record Number: [id number] Patient Account Number: [id number] Date of Birth/Sex: Treating RN: 1950-11-06 (69 y.o. F) Dwiggins, Carollee Herter Primary Care Orion Vandervort: Other Clinician: Referring Kimyetta Flott: Treating Kaysia Willard/Extender: Maryla Morrow in Treatment: 0 Wound Status Wound Number: 1 Primary Venous Leg Ulcer Etiology: Wound Location: Left, Anterior Lower Leg Secondary Diabetic Wound/Ulcer of the Lower Extremity Wounding Event: Gradually Appeared Etiology: Date Acquired: 09/20/2019 Wound Open Weeks Of Treatment: 0 Status: Clustered Wound: No Comorbid Cataracts, Chronic Obstructive Pulmonary Disease (COPD), History: Congestive Heart Failure, Hypertension, Peripheral Venous Disease, Type II Diabetes, Neuropathy Wound Measurements Length: (cm) 0.9 Width: (cm) 0.8 Depth: (cm) 0.2 Area: (cm) 0.565 Volume: (cm) 0.113 % Reduction in Area: 0% % Reduction in Volume: 0% Epithelialization: None Tunneling: No Undermining: No Wound Description Classification: Full Thickness Without Exposed Support Structures Wound Margin: Well defined, not attached Exudate Amount: Medium Exudate Type: Serous Exudate Color: amber Foul Odor After Cleansing: No Slough/Fibrino Yes Wound Bed Granulation Amount: Medium (34-66%) Exposed Structure Granulation Quality: Pink Fascia Exposed: No Necrotic Amount: Medium (34-66%) Fat Layer (Subcutaneous Tissue) Exposed: Yes Necrotic Quality: Adherent Slough Tendon Exposed: No Muscle Exposed: No Joint Exposed:  No Bone Exposed: No Treatment Notes Wound #1 (Left, Anterior Lower Leg) 1. Cleanse With Wound Cleanser 3. Primary Dressing Applied Santyl 4. Secondary Dressing Foam Border Dressing Electronic Signature(s) Signed: 01/01/2020 5:05:13 PM By: Cherylin Mylar Entered By: Christianne Borrow,  Shannon on 01/01/2020 15:50:39 -------------------------------------------------------------------------------- Wound Assessment Details Patient Name: Date of Service: Patricia [name], Patricia [name] 01/01/2020 2:45 PM Medical Record Number: [id number] Patient Account Number: [id number] Date of Birth/Sex: Treating RN: 03/09/1951 (69 y.o. F) Dwiggins, Carollee Herter Primary Care Ommie Degeorge: Other Clinician: Referring Rayan Ines: Treating Tanner Yeley/Extender: Maryla Morrow in Treatment: 0 Wound Status Wound Number: 2 Primary Venous Leg Ulcer Etiology: Wound Location: Right, Dorsal Foot Secondary Diabetic Wound/Ulcer of the Lower Extremity Wounding Event: Gradually Appeared Etiology: Date Acquired: 10/21/2019 Wound Open Weeks Of Treatment: 0 Status: Clustered Wound: No Comorbid Cataracts, Chronic Obstructive Pulmonary Disease (COPD), History: Congestive Heart Failure, Hypertension, Peripheral Venous Disease, Type II Diabetes, Neuropathy Wound Measurements Length: (cm) 0.9 Width: (cm) 0.9 Depth: (cm) 0.2 Area: (cm) 0.636 Volume: (cm) 0.127 % Reduction in Area: 0% % Reduction in Volume: 0% Epithelialization: None Tunneling: No Undermining: No Wound Description Classification: Full Thickness Without Exposed Support Structu Wound Margin: Well defined, not attached Exudate Amount: Small Exudate Type: Serous Exudate Color: amber res Foul Odor After Cleansing: No Slough/Fibrino Yes Wound Bed Granulation Amount: Medium (34-66%) Exposed Structure Granulation Quality: Pink Fascia Exposed: No Necrotic Amount: Medium (34-66%) Fat Layer (Subcutaneous Tissue) Exposed: Yes Necrotic Quality: Adherent Slough Tendon  Exposed: No Muscle Exposed: No Joint Exposed: No Bone Exposed: No Treatment Notes Wound #2 (Right, Dorsal Foot) 1. Cleanse With Wound Cleanser 3. Primary Dressing Applied Santyl 4. Secondary Dressing Foam Border Dressing Electronic Signature(s) Signed: 01/01/2020 5:05:13 PM By: Cherylin Mylar Entered By: Cherylin Mylar on 01/01/2020 15:51:04 -------------------------------------------------------------------------------- Wound Assessment Details Patient Name: Date of Service: Patricia [name], Patricia [name] 01/01/2020 2:45 PM Medical Record Number: [id number] Patient Account Number: [id number] Date of Birth/Sex: Treating RN: November 03, 1950 (69 y.o. F) Dwiggins, Carollee Herter Primary Care Daymion Nazaire: Other Clinician: Referring Meloney Feld: Treating Benetta Maclaren/Extender: Maryla Morrow in Treatment: 0 Wound Status Wound Number: 3 Primary Venous Leg Ulcer Etiology: Wound Location: Right, Distal, Anterior Lower Leg Wound Open Wounding Event: Gradually Appeared Status: Date Acquired: 10/21/2019 Comorbid Cataracts, Chronic Obstructive Pulmonary Disease (COPD), Weeks Of Treatment: 0 History: Congestive Heart Failure, Hypertension, Peripheral Venous Clustered Wound: No Disease, Type II Diabetes, Neuropathy Wound Measurements Length: (cm) 0.5 Width: (cm) 0.8 Depth: (cm) 0.2 Area: (cm) 0.314 Volume: (cm) 0.063 % Reduction in Area: % Reduction in Volume: Epithelialization: None Tunneling: No Undermining: No Wound Description Classification: Full Thickness Without Exposed Support Structures Wound Margin: Well defined, not attached Exudate Amount: Small Exudate Type: Serous Exudate Color: amber Foul Odor After Cleansing: No Slough/Fibrino Yes Wound Bed Granulation Amount: Small (1-33%) Exposed Structure Granulation Quality: Pink Fascia Exposed: No Necrotic Amount: Large (67-100%) Fat Layer (Subcutaneous Tissue) Exposed: Yes Necrotic Quality: Adherent Slough Tendon Exposed: No Muscle  Exposed: No Joint Exposed: No Bone Exposed: No Electronic Signature(s) Signed: 01/01/2020 5:05:13 PM By: Cherylin Mylar Entered By: Cherylin Mylar on 01/01/2020 15:44:09 -------------------------------------------------------------------------------- Wound Assessment Details Patient Name: Date of Service: Patricia [name], Patricia [name] 01/01/2020 2:45 PM Medical Record Number: [id number] Patient Account Number: [id number] Date of Birth/Sex: Treating RN: 1950-08-12 (69 y.o. F) Dwiggins, Carollee Herter Primary Care Axl Rodino: Other Clinician: Referring Zooey Schreurs: Treating Marven Veley/Extender: Maryla Morrow in Treatment: 0 Wound Status Wound Number: 4 Primary Venous Leg Ulcer Etiology: Wound Location: Right, Medial Lower Leg Wound Open Wounding Event: Gradually Appeared Status: Date Acquired: 01/21/2019 Comorbid Cataracts, Chronic Obstructive Pulmonary Disease (COPD), Weeks Of Treatment: 0 History: Congestive Heart Failure, Hypertension, Peripheral Venous Clustered Wound: No Disease, Type II Diabetes, Neuropathy Wound Measurements Length: (cm) 2.4 Width: (cm) 1.9 Depth: (cm) 0.9 Area: (cm) 3.581 Volume: (cm) 3.223 % Reduction in Area: %  Reduction in Volume: Epithelialization: None Tunneling: No Undermining: No Wound Description Classification: Full Thickness Without Exposed Support Structures Wound Margin: Well defined, not attached Exudate Amount: Medium Exudate Type: Serous Exudate Color: amber Foul Odor After Cleansing: No Slough/Fibrino Yes Wound Bed Granulation Amount: Small (1-33%) Exposed Structure Granulation Quality: Pink Fascia Exposed: No Necrotic Amount: Large (67-100%) Fat Layer (Subcutaneous Tissue) Exposed: Yes Necrotic Quality: Eschar, Adherent Slough Tendon Exposed: No Muscle Exposed: No Joint Exposed: No Bone Exposed: No Electronic Signature(s) Signed: 01/01/2020 5:05:13 PM By: Cherylin Mylar Entered By: Cherylin Mylar on 01/01/2020  15:47:52 -------------------------------------------------------------------------------- Vitals Details Patient Name: Date of Service: Patricia [name] 01/01/2020 2:45 PM Medical Record Number: [id number] Patient Account Number: [id number] Date of Birth/Sex: Treating RN: Dec 19, 1950 (69 y.o. F) Dwiggins, Carollee Herter Primary Care Leonard Hendler: Other Clinician: Referring Syan Cullimore: Treating Dessie Tatem/Extender: Maryla Morrow in Treatment: 0 Vital Signs Time Taken: 15:11 Temperature (F): 97.7 Height (in): 64 Pulse (bpm): 67 Source: Stated Respiratory Rate (breaths/min): 18 Weight (lbs): 172 Blood Pressure (mmHg): 138/65 Source: Stated Reference Range: 80 - 120 mg / dl Body Mass Index (BMI): 29.5 Notes patient stated she did not check her CBG this morning Electronic Signature(s) Signed: 01/01/2020 5:05:13 PM By: Cherylin Mylar Entered By: Cherylin Mylar on 01/01/2020 15:13:00

## 2020-01-08 ENCOUNTER — Other Ambulatory Visit: Payer: Self-pay

## 2020-01-08 ENCOUNTER — Ambulatory Visit (HOSPITAL_BASED_OUTPATIENT_CLINIC_OR_DEPARTMENT_OTHER): Payer: Self-pay | Admitting: Internal Medicine

## 2020-01-08 ENCOUNTER — Encounter (HOSPITAL_BASED_OUTPATIENT_CLINIC_OR_DEPARTMENT_OTHER): Payer: Medicare Other | Admitting: Internal Medicine

## 2020-01-08 DIAGNOSIS — E1142 Type 2 diabetes mellitus with diabetic polyneuropathy: Secondary | ICD-10-CM | POA: Diagnosis not present

## 2020-01-08 NOTE — Progress Notes (Signed)
Patricia [name], Patricia [name] ([id number]) Visit Report for 01/08/2020 Debridement Details Patient Name: Date of Service: Patricia [name], Patricia [name] 01/08/2020 3:30 PM Medical Record Number: [id number] Patient Account Number: [id number] Date of Birth/Sex: Treating RN: 01-06-1951 (69 y.o. Wynelle Link Primary Care Provider: Other Clinician: Referring Provider: Treating Provider/Extender: Valentino Saxon in Treatment: 1 Debridement Performed for Assessment: Wound #1 Left,Anterior Lower Leg Performed By: Clinician Zandra Abts, RN Debridement Type: Chemical/Enzymatic/Mechanical Agent Used: Santyl Severity of Tissue Pre Debridement: Fat layer exposed Level of Consciousness (Pre-procedure): Awake and Alert Pre-procedure Verification/Time Out No Taken: Start Time: 16:45 Bleeding: None End Time: 16:45 Procedural Pain: 0 Post Procedural Pain: 0 Response to Treatment: Procedure was tolerated well Level of Consciousness (Post- Awake and Alert procedure): Post Debridement Measurements of Total Wound Length: (cm) 0.7 Width: (cm) 0.5 Depth: (cm) 0.2 Volume: (cm) 0.055 Character of Wound/Ulcer Post Debridement: Requires Further Debridement Severity of Tissue Post Debridement: Fat layer exposed Post Procedure Diagnosis Same as Pre-procedure Electronic Signature(s) Signed: 01/08/2020 5:36:57 PM By: Zandra Abts RN, BSN Signed: 01/08/2020 5:57:44 PM By: Baltazar Najjar MD Entered By: Zandra Abts on 01/08/2020 17:34:01 -------------------------------------------------------------------------------- Debridement Details Patient Name: Date of Service: Patricia [name], Patricia [name] 01/08/2020 3:30 PM Medical Record Number: [id number] Patient Account Number: [id number] Date of Birth/Sex: Treating RN: 1950-06-19 (69 y.o. Wynelle Link Primary Care Provider: Other Clinician: Referring Provider: Treating Provider/Extender: Valentino Saxon in Treatment: 1 Debridement Performed for Assessment: Wound #2  Right,Dorsal Foot Performed By: Clinician Zandra Abts, RN Debridement Type: Chemical/Enzymatic/Mechanical Agent Used: Santyl Severity of Tissue Pre Debridement: Fat layer exposed Level of Consciousness (Pre-procedure): Awake and Alert Pre-procedure Verification/Time Out No No Taken: Start Time: 16:45 Bleeding: None End Time: 16:45 Procedural Pain: 0 Post Procedural Pain: 0 Response to Treatment: Procedure was tolerated well Level of Consciousness (Post- Awake and Alert procedure): Post Debridement Measurements of Total Wound Length: (cm) 0.5 Width: (cm) 0.9 Depth: (cm) 0.2 Volume: (cm) 0.071 Character of Wound/Ulcer Post Debridement: Requires Further Debridement Severity of Tissue Post Debridement: Fat layer exposed Post Procedure Diagnosis Same as Pre-procedure Electronic Signature(s) Signed: 01/08/2020 5:36:57 PM By: Zandra Abts RN, BSN Signed: 01/08/2020 5:57:44 PM By: Baltazar Najjar MD Entered By: Zandra Abts on 01/08/2020 17:34:21 -------------------------------------------------------------------------------- Debridement Details Patient Name: Date of Service: Patricia [name], Patricia [name] 01/08/2020 3:30 PM Medical Record Number: [id number] Patient Account Number: [id number] Date of Birth/Sex: Treating RN: 02-23-1951 (69 y.o. Wynelle Link Primary Care Provider: Other Clinician: Referring Provider: Treating Provider/Extender: Valentino Saxon in Treatment: 1 Debridement Performed for Assessment: Wound #3 Right,Distal,Anterior Lower Leg Performed By: Clinician Zandra Abts, RN Debridement Type: Chemical/Enzymatic/Mechanical Agent Used: Santyl Severity of Tissue Pre Debridement: Fat layer exposed Level of Consciousness (Pre-procedure): Awake and Alert Pre-procedure Verification/Time Out No Taken: Start Time: 16:45 Bleeding: None End Time: 16:45 Procedural Pain: 0 Post Procedural Pain: 0 Response to Treatment: Procedure was tolerated well Level of  Consciousness (Post- Awake and Alert procedure): Post Debridement Measurements of Total Wound Length: (cm) 0.3 Width: (cm) 0.7 Depth: (cm) 0.1 Volume: (cm) 0.016 Character of Wound/Ulcer Post Debridement: Requires Further Debridement Severity of Tissue Post Debridement: Fat layer exposed Post Procedure Diagnosis Same as Pre-procedure Electronic Signature(s) Signed: 01/08/2020 5:36:57 PM By: Zandra Abts RN, BSN Signed: 01/08/2020 5:57:44 PM By: Baltazar Najjar MD Entered By: Zandra Abts on 01/08/2020 17:34:39 -------------------------------------------------------------------------------- Debridement Details Patient Name: Date of Service: Patricia [name], Patricia [name] 01/08/2020 3:30 PM Medical Record Number: [id number] Patient Account Number: [id number] Date of Birth/Sex: Treating RN: [birth year] (69 y.o. Wynelle Link Primary Care Provider:  Other Clinician: Referring Provider: Treating Provider/Extender: Valentino Saxon in Treatment: 1 Debridement Performed for Assessment: Wound #4 Right,Medial Lower Leg Performed By: Clinician Zandra Abts, RN Debridement Type: Chemical/Enzymatic/Mechanical Agent Used: Santyl Severity of Tissue Pre Debridement: Fat layer exposed Level of Consciousness (Pre-procedure): Awake and Alert Pre-procedure Verification/Time Out No Taken: Start Time: 16:45 Bleeding: None End Time: 16:45 Procedural Pain: 0 Post Procedural Pain: 0 Response to Treatment: Procedure was tolerated well Level of Consciousness (Post- Awake and Alert procedure): Post Debridement Measurements of Total Wound Length: (cm) 2.5 Width: (cm) 2.1 Depth: (cm) 0.3 Volume: (cm) 1.237 Character of Wound/Ulcer Post Debridement: Requires Further Debridement Severity of Tissue Post Debridement: Fat layer exposed Post Procedure Diagnosis Same as Pre-procedure Electronic Signature(s) Signed: 01/08/2020 5:36:57 PM By: Zandra Abts RN, BSN Signed: 01/08/2020 5:57:44 PM By:  Baltazar Najjar MD Entered By: Zandra Abts on 01/08/2020 17:35:04 -------------------------------------------------------------------------------- Debridement Details Patient Name: Date of Service: Patricia [name], Patricia [name] 01/08/2020 3:30 PM Medical Record Number: [id number] Patient Account Number: [id number] Date of Birth/Sex: Treating RN: 1951/01/29 (69 y.o. Wynelle Link Primary Care Provider: Other Clinician: Referring Provider: Treating Provider/Extender: Valentino Saxon in Treatment: 1 Debridement Performed for Assessment: Wound #5 Right T Second oe Performed By: Clinician Zandra Abts, RN Debridement Type: Chemical/Enzymatic/Mechanical Agent Used: Santyl Severity of Tissue Pre Debridement: Fat layer exposed Level of Consciousness (Pre-procedure): Awake and Alert Pre-procedure Verification/Time Out No Taken: Start Time: 16:45 Bleeding: None End Time: 16:45 Procedural Pain: 0 Post Procedural Pain: 0 Response to Treatment: Procedure was tolerated well Level of Consciousness (Post- Awake and Alert procedure): Post Debridement Measurements of Total Wound Length: (cm) 0.6 Width: (cm) 0.6 Depth: (cm) 0.1 Volume: (cm) 0.028 Character of Wound/Ulcer Post Debridement: Requires Further Debridement Severity of Tissue Post Debridement: Fat layer exposed Post Procedure Diagnosis Same as Pre-procedure Electronic Signature(s) Signed: 01/08/2020 5:36:57 PM By: Zandra Abts RN, BSN Signed: 01/08/2020 5:57:44 PM By: Baltazar Najjar MD Entered By: Zandra Abts on 01/08/2020 17:35:26 -------------------------------------------------------------------------------- HPI Details Patient Name: Date of Service: Patricia [name], Patricia [name] 01/08/2020 3:30 PM Medical Record Number: [id number] Patient Account Number: [id number] Date of Birth/Sex: Treating RN: 1950-11-21 (69 y.o. 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 Patient has most symptoms from the right medial calf wound with pain and discomfort. She has been putting Neosporin ointment on this wound and on the other wounds on the right and left legs. ABI in the clinic was unobtainable Patient's history significant for type 2 diabetes poorly controlled with the latest A1c of 11.8, ongoing tobacco abuse, COPD-non-O2 dependent, CHF/chronic diastolic heart failure, Patient denies any fevers or chills, denies any other symptoms other than discomfort in the legs due to the wounds. 8/19; this is a patient who is a type II diabetic and a smoker. She was admitted to clinic last week with bilateral small punched-out painful wounds on her bilateral lower extremities and feet. Her ABIs in the clinic were 0.45 on the left and noncompressible on the right. She lives in [place] and requires public transportation to get her  back and forth. We attempted to arrange for arterial studies at the hospital in [place] city but we do not have any information on this. We also ordered Santyl from [employer] she did not get that either. She has been applying a and D ointment to the wounds In speaking to the patient she is a minimal ambulator limited by both pain in her legs which may be claudication although she has apparently lumbar spinal stenosis. She tells me the maximum amount of walking she can do is 4 minutes while walking her dog Electronic Signature(s) Signed: 01/08/2020 5:57:44 PM By: Baltazar Najjar MD Entered By: Baltazar Najjar on  01/08/2020 17:03:38 -------------------------------------------------------------------------------- Physical Exam Details Patient Name: Date of Service: Patricia [name], Patricia [name] 01/08/2020 3:30 PM Medical Record Number: [id number] Patient Account Number: [id number] Date of Birth/Sex: Treating RN: April 25, 1951 (69 y.o. Wynelle Link Primary Care Provider: Other Clinician: Referring Provider: Treating Provider/Extender: Valentino Saxon in Treatment: 1 Cardiovascular I cannot feel her popliteal or femoral pulses. Pedal pulses absent bilaterally.. No edema markedly reduced capillary refill time. Notes Wound exam; the patient has a necrotic wound on the right medial calf which is the worst of her wounds. Right second toe toe and foot., areas on the left lower leg. All of these of nonviable surfaces. No mechanical debridement is indicated at this point until we clarify her arterial status Electronic Signature(s) Signed: 01/08/2020 5:57:44 PM By: Baltazar Najjar MD Entered By: Baltazar Najjar on 01/08/2020 17:06:39 -------------------------------------------------------------------------------- Physician Orders Details Patient Name: Date of Service: Patricia [name], Patricia [name] 01/08/2020 3:30 PM Medical Record Number: [id number] Patient Account Number: [id number] Date of Birth/Sex: Treating RN: 1951-05-04 (69 y.o. 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 Dressing Change Frequency Change dressing every day. - all wounds - home health to change twice a week, pt to  change all other days Wound Cleansing May shower and wash wound with soap and water. Primary Wound Dressing Wound #1 Left,Anterior Lower Leg Santyl Ointment - may use Medihoney if Santyl unavailable Wound #2 Right,Dorsal Foot Santyl Ointment - may use Medihoney if Santyl unavailable Wound #3 Right,Distal,Anterior Lower Leg Santyl Ointment - may use Medihoney if Santyl unavailable Wound #4 Right,Medial Lower Leg Santyl Ointment - may use Medihoney if Santyl unavailable Wound #5 Right T Second oe Santyl Ointment - may use Medihoney if Santyl unavailable Secondary Dressing Wound #1 Left,Anterior Lower Leg Foam Border - or gauze and tape Wound #2 Right,Dorsal Foot Foam Border - or gauze and tape Wound #3 Right,Distal,Anterior Lower Leg Foam Border - or gauze and tape Wound #4 Right,Medial Lower Leg Foam Border - or gauze and tape Wound #5 Right T Second oe Dry Gauze - or bandaid Home Health Continue Home Health skilled nursing for wound care. [facility] (Fax: [phone]) Phone: [phone] Electronic Signature(s) Signed: 01/08/2020 5:36:57 PM By: Zandra Abts RN, BSN Signed: 01/08/2020 5:57:44 PM By: Baltazar Najjar MD Entered By: Zandra Abts on 01/08/2020 16:40:59 -------------------------------------------------------------------------------- Problem List Details Patient Name: Date of Service: Patricia [name], Patricia [name] 01/08/2020 3:30 PM Medical Record Number: [id number] Patient Account Number: [id number] Date of Birth/Sex: Treating RN: August 11, 1950 (69 y.o. 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 Patient has most symptoms from the right medial calf wound with pain and discomfort. She has been putting Neosporin ointment on this wound and on the other wounds on the right and left legs. ABI in the clinic was unobtainable Patient's history significant for type 2 diabetes poorly controlled with the latest A1c of 11.8, ongoing tobacco abuse, COPD-non-O2 dependent, CHF/chronic diastolic heart failure, Patient denies any fevers or chills, denies any other symptoms other than discomfort in the legs due to the wounds. 8/19; this is a patient who is a type II diabetic and a smoker. She was admitted to clinic last week with bilateral small punched-out painful wounds on her bilateral lower extremities and feet. Her ABIs in the clinic were 0.45 on the left and noncompressible on the right. She lives in [place] city and requires public transportation to get her  back and forth. We attempted to arrange for arterial studies at the hospital in [place] but we do not have any information on this. We also ordered Santyl from [employer] she did not get that either. She has been applying a and D ointment to the wounds In speaking to the patient she is a minimal ambulator limited by both pain in her legs which may be claudication although she has apparently lumbar spinal stenosis. She tells me the maximum amount of walking she can do is 4 minutes while walking her dog Objective Constitutional Vitals Time Taken: 3:54 PM, Height: 64 in, Weight: 172 lbs, BMI: 29.5, Temperature: 98.2 F, Pulse: 71 bpm, Respiratory Rate: 18 breaths/min, Blood Pressure: 135/68 mmHg. Cardiovascular I cannot feel her popliteal or femoral pulses. Pedal pulses absent bilaterally.. No edema markedly reduced capillary refill time. General Notes: Wound exam; the patient has a necrotic wound on the right medial calf which is the worst of her wounds. Right second toe toe and foot., areas on the left lower leg. All of these of nonviable surfaces. No mechanical debridement is indicated at this point until we clarify her arterial status Integumentary (Hair, Skin) Wound #1 status is Open. Original cause of wound was Gradually Appeared. The wound is located on the Left,Anterior Lower Leg. The wound measures 0.7cm length x 0.5cm width x 0.2cm depth; 0.275cm^2 area and 0.055cm^3 volume. There is Fat Layer (Subcutaneous Tissue) Exposed exposed. There is no tunneling or undermining noted. There is  a medium amount of serous drainage noted. The wound margin is well defined and not attached to the wound base. There is medium (34-66%) pink granulation within the wound bed. There is a medium (34-66%) amount of necrotic tissue within the wound bed including Adherent Slough. Wound #2 status is Open. Original cause of wound was Gradually Appeared. The wound is located on the Right,Dorsal Foot.  The wound measures 0.5cm length x 0.9cm width x 0.2cm depth; 0.353cm^2 area and 0.071cm^3 volume. There is Fat Layer (Subcutaneous Tissue) Exposed exposed. There is no tunneling or undermining noted. There is a small amount of serous drainage noted. The wound margin is well defined and not attached to the wound base. There is medium (34-66%) pink granulation within the wound bed. There is a medium (34-66%) amount of necrotic tissue within the wound bed including Adherent Slough. Wound #3 status is Open. Original cause of wound was Gradually Appeared. The wound is located on the Right,Distal,Anterior Lower Leg. The wound measures 0.3cm length x 0.7cm width x 0.1cm depth; 0.165cm^2 area and 0.016cm^3 volume. There is Fat Layer (Subcutaneous Tissue) Exposed exposed. There is no tunneling or undermining noted. There is a small amount of serous drainage noted. The wound margin is well defined and not attached to the wound base. There is small (1-33%) pink granulation within the wound bed. There is a large (67-100%) amount of necrotic tissue within the wound bed including Adherent Slough. Wound #4 status is Open. Original cause of wound was Gradually Appeared. The wound is located on the Right,Medial Lower Leg. The wound measures 2.5cm length x 2.1cm width x 0.3cm depth; 4.123cm^2 area and 1.237cm^3 volume. There is Fat Layer (Subcutaneous Tissue) Exposed exposed. There is no tunneling or undermining noted. There is a medium amount of serous drainage noted. The wound margin is well defined and not attached to the wound base. There is small (1-33%) pink granulation within the wound bed. There is a large (67-100%) amount of necrotic tissue within the wound bed including Eschar and Adherent Slough. Wound #5 status is Open. Original cause of wound was Gradually Appeared. The wound is located on the Right T Second. The wound measures 0.6cm length oe x 0.6cm width x 0.1cm depth; 0.283cm^2 area and 0.028cm^3  volume. There is Fat Layer (Subcutaneous Tissue) Exposed exposed. There is no tunneling or undermining noted. There is a medium amount of serosanguineous drainage noted. There is medium (34-66%) pink granulation within the wound bed. There is a medium (34-66%) amount of necrotic tissue within the wound bed including Adherent Slough. 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 There was a Chemical/Enzymatic/Mechanical debridement performed by Zandra Abts, RN.Marland Kitchen Agent used was [employer]. There was no bleeding. The procedure was tolerated well with a pain level of 0 throughout and a pain level of 0 following the procedure. Post Debridement Measurements: 0.7cm length x 0.5cm width x 0.2cm depth; 0.055cm^3 volume. Character of Wound/Ulcer Post Debridement requires further debridement. Severity of Tissue Post Debridement is: Fat layer exposed. Post procedure Diagnosis Wound #1: Same as Pre-Procedure Wound #2 Pre-procedure diagnosis of Wound #2 is a Venous Leg Ulcer located on the Right,Dorsal Foot .Severity of Tissue Pre Debridement is: Fat layer exposed. There was  a Chemical/Enzymatic/Mechanical debridement performed by Zandra Abts, RN.Marland Kitchen Agent used was [employer]. There was no bleeding. The procedure was tolerated well with a pain level of 0 throughout and a pain level of 0 following the procedure. Post Debridement Measurements: 0.5cm length x 0.9cm width x 0.2cm depth; 0.071cm^3 volume. Character of Wound/Ulcer Post Debridement requires further debridement. Severity of Tissue Post Debridement is: Fat layer exposed. Post procedure Diagnosis Wound #2: Same  as Pre-Procedure Wound #3 Pre-procedure diagnosis of Wound #3 is a Venous Leg Ulcer located on the Right,Distal,Anterior Lower Leg .Severity of Tissue Pre Debridement is: Fat layer exposed. There was a Chemical/Enzymatic/Mechanical debridement performed by Zandra Abts, RN.Marland Kitchen Agent used was [employer]. There was no bleeding. The procedure was tolerated well with a pain level of 0 throughout and a pain level of 0 following the procedure. Post Debridement Measurements: 0.3cm length x 0.7cm width x 0.1cm depth; 0.016cm^3 volume. Character of Wound/Ulcer Post Debridement requires further debridement. Severity of Tissue Post Debridement is: Fat layer exposed. Post procedure Diagnosis Wound #3: Same as Pre-Procedure Wound #4 Pre-procedure diagnosis of Wound #4 is a Venous Leg Ulcer located on the Right,Medial Lower Leg .Severity of Tissue Pre Debridement is: Fat layer exposed. There was a Chemical/Enzymatic/Mechanical debridement performed by Zandra Abts, RN.Marland Kitchen Agent used was [employer]. There was no bleeding. The procedure was tolerated well with a pain level of 0 throughout and a pain level of 0 following the procedure. Post Debridement Measurements: 2.5cm length x 2.1cm width x 0.3cm depth; 1.237cm^3 volume. Character of Wound/Ulcer Post Debridement requires further debridement. Severity of Tissue Post Debridement is: Fat layer exposed. Post procedure Diagnosis Wound #4: Same as Pre-Procedure Wound #5 Pre-procedure diagnosis of Wound #5 is a Diabetic Wound/Ulcer of the Lower Extremity located on the Right T Second .Severity of Tissue Pre Debridement oe is: Fat layer exposed. There was a Chemical/Enzymatic/Mechanical debridement performed by Zandra Abts, RN.Marland Kitchen Agent used was [employer]. There was no bleeding. The procedure was tolerated well with a pain level of 0 throughout and a pain level of 0 following the procedure. Post Debridement Measurements: 0.6cm length x 0.6cm width x 0.1cm depth; 0.028cm^3  volume. Character of Wound/Ulcer Post Debridement requires further debridement. Severity of Tissue Post Debridement is: Fat layer exposed. Post procedure Diagnosis Wound #5: Same as Pre-Procedure Plan Follow-up Appointments: Return Appointment in 2 weeks. Dressing Change Frequency: Change dressing every day. - all wounds - home health to change twice a week, pt to change all other days Wound Cleansing: May shower and wash wound with soap and water. Primary Wound Dressing: Wound #1 Left,Anterior Lower Leg: Santyl Ointment - may use Medihoney if Santyl unavailable Wound #2 Right,Dorsal Foot: Santyl Ointment - may use Medihoney if Santyl unavailable Wound #3 Right,Distal,Anterior Lower Leg: Santyl Ointment - may use Medihoney if Santyl unavailable Wound #4 Right,Medial Lower Leg: Santyl Ointment - may use Medihoney if Santyl unavailable Wound #5 Right T Second: oe Santyl Ointment - may use Medihoney if Santyl unavailable Secondary Dressing: Wound #1 Left,Anterior Lower Leg: Foam Border - or gauze and tape Wound #2 Right,Dorsal Foot: Foam Border - or gauze and tape Wound #3 Right,Distal,Anterior Lower Leg: Foam Border - or gauze and tape Wound #4 Right,Medial Lower Leg: Foam Border - or gauze and tape Wound #5 Right T Second: oe Dry Gauze - or bandaid Home Health: Continue Home Health skilled nursing for wound care. [facility] (Fax: [phone]) Phone: [phone] 1. Santyl ointment to all wounds. Hopefully will be able to get this in the  next week 2. The patient needs to move ahead with noninvasive arterial studies. I suspect these will be poor and she will need to see vascular. I cannot feel pulses in either of her lower extremities including the femoral she very well could have proximal disease 3. I have again talked to her about cigarette smoking vis--vis vasospasm, tissue ischemia etc. Electronic Signature(s) Signed: 01/08/2020 5:36:57 PM By: Zandra Abts RN, BSN Signed:  01/08/2020 5:57:44 PM By: Baltazar Najjar MD Entered By: Zandra Abts on 01/08/2020 17:35:39 -------------------------------------------------------------------------------- SuperBill Details Patient Name: Date of Service: Patricia [name], Patricia [name] 01/08/2020 Medical Record Number: [id number] Patient Account Number: [id number] Date of Birth/Sex: Treating RN: 1950/10/29 (69 y.o. 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 E11.51 Type 2 diabetes mellitus with diabetic peripheral angiopathy without gangrene Facility Procedures CPT4 Code: [id number] Description: [phone] - [facility] W/O ANES NON SELECT Modifier: Quantity: 1 Physician Procedures : CPT4 Code Description Modifier [id number] - WC PHYS LEVEL 3 - EST PT ICD-10 Diagnosis Description L97.212 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

## 2020-01-17 NOTE — Progress Notes (Signed)
Patricia [name], Patricia [name] ([id number]) Visit Report for 01/08/2020 Arrival Information Details Patient Name: Date of Service: Patricia [name], Patricia [name] 01/08/2020 3:30 PM Medical Record Number: [id number] Patient Account Number: [id number] Date of Birth/Sex: Treating RN: [birth year] (69 Patricia [name].o. Female) Epps, Carrie Primary Care Jenay Morici: Other Clinician: Referring Delfina Schreurs: Treating Tatayana Beshears/Extender: Valentino Saxon in Treatment: 1 Visit Information History Since Last Visit All ordered tests and consults were completed: No Patient Arrived: Dan Humphreys Added or deleted any medications: No Arrival Time: 15:49 Any new allergies or adverse reactions: No Accompanied By: self Had a fall or experienced change in No Transfer Assistance: None activities of daily living that may affect Patient Identification Verified: Yes risk of falls: Secondary Verification Process Completed: Yes Signs or symptoms of abuse/neglect since last visito No Patient Has Alerts: Yes Hospitalized since last visit: No Patient Alerts: Patient on Blood Thinner Implantable device outside of the clinic excluding No cellular tissue based products placed in the center since last visit: Has Dressing in Place as Prescribed: Yes Pain Present Now: No Electronic Signature(s) Signed: 01/16/2020 5:50:16 PM By: Yevonne Pax RN Entered By: Yevonne Pax on 01/08/2020 15:54:42 -------------------------------------------------------------------------------- Encounter Discharge Information Details Patient Name: Date of Service: Patricia [name], Patricia [name] 01/08/2020 3:30 PM Medical Record Number: [id number] Patient Account Number: [id number] Date of Birth/Sex: Treating RN: Sep 27, 1950 (69 Patricia [name].o. Female) Yevonne Pax Primary Care Amara Manalang: Other Clinician: Referring Kellina Dreese: Treating Aidan Moten/Extender: Valentino Saxon in Treatment: 1 Encounter Discharge Information Items Discharge Condition: Stable Ambulatory Status: Walker Discharge Destination:  Home Transportation: Private Auto Accompanied By: self Schedule Follow-up Appointment: Yes Clinical Summary of Care: Patient Declined Electronic Signature(s) Signed: 01/16/2020 5:50:16 PM By: Yevonne Pax RN Entered By: Yevonne Pax on 01/08/2020 17:02:32 -------------------------------------------------------------------------------- Lower Extremity Assessment Details Patient Name: Date of Service: Patricia [name], Patricia [name] 01/08/2020 3:30 PM Medical Record Number: [id number] Patient Account Number: [id number] Date of Birth/Sex: Treating RN: Aug 09, 1950 (69 Patricia [name].o. Female) Yevonne Pax Primary Care Chidera Dearcos: Other Clinician: Referring Tacy Chavis: Treating Earland Reish/Extender: Valentino Saxon in Treatment: 1 Edema Assessment Assessed: [Left: No] [Right: No] E[Left: dema] [Right: :] Calf Left: Right: Point of Measurement: 38 cm From Medial Instep 38 cm 37 cm Ankle Left: Right: Point of Measurement: 12 cm From Medial Instep 21.5 cm 21.5 cm Electronic Signature(s) Signed: 01/16/2020 5:50:16 PM By: Yevonne Pax RN Entered By: Yevonne Pax on 01/08/2020 15:59:21 -------------------------------------------------------------------------------- Multi Wound Chart Details Patient Name: Date of Service: Patricia [name], Patricia [name] 01/08/2020 3:30 PM Medical Record Number: [id number] Patient Account Number: [id number] Date of Birth/Sex: Treating RN: 09-Feb-1951 (69 Patricia [name].o. Female) Zandra Abts Primary Care Theotis Gerdeman: Other Clinician: Referring Jahmil Macleod: Treating Jackob Crookston/Extender: Valentino Saxon in Treatment: 1 Vital Signs Height(in): 64 Pulse(bpm): 71 Weight(lbs): 172 Blood Pressure(mmHg): 135/68 Body Mass Index(BMI): 30 Temperature(F): 98.2 Respiratory Rate(breaths/min): 18 Photos: [1:No Photos Left, Anterior Lower Leg] [2:No Photos Right, Dorsal Foot] [3:No Photos Right, Distal, Anterior Lower Leg] Wound Location: [1:Gradually Appeared] [2:Gradually Appeared] [3:Gradually  Appeared] Wounding Event: [1:Venous Leg Ulcer] [2:Venous Leg Ulcer] [3:Venous Leg Ulcer] Primary Etiology: [1:Diabetic Wound/Ulcer of the Lower] [2:Diabetic Wound/Ulcer of the Lower] [3:Diabetic Wound/Ulcer of the Lower] Secondary Etiology: [1:Extremity Cataracts, Chronic Obstructive] [2:Extremity Cataracts, Chronic Obstructive] [3:Extremity Cataracts, Chronic Obstructive] Comorbid History: [1:Pulmonary Disease (COPD), Congestive Heart Failure, Hypertension, Peripheral Venous Disease, Type II Diabetes, Neuropathy 09/20/2019] [2:Pulmonary Disease (COPD), Congestive Heart Failure, Hypertension, Peripheral Venous Disease, Type  II Diabetes, Neuropathy 10/21/2019] [3:Pulmonary Disease (COPD), Congestive Heart Failure, Hypertension, Peripheral Venous Disease, Type II Diabetes, Neuropathy 10/21/2019] Date Acquired: [1:1] [2:1] [3:1] Weeks of Treatment: [1:Open] [2:Open] [3:Open] Wound Status: [1:0.7x0.5x0.2] [  2:0.5x0.9x0.2] [3:0.3x0.7x0.1] Measurements L x W x D (cm) [1:0.275] [2:0.353] [3:0.165] A (cm) : rea [1:0.055] [2:0.071] [3:0.016] Volume (cm) : [1:51.30%] [2:44.50%] [3:47.50%] % Reduction in Area: [1:51.30%] [2:44.10%] [3:74.60%] % Reduction in Volume: [1:Full Thickness Without Exposed] [2:Full Thickness Without Exposed] [3:Full Thickness Without Exposed] Classification: [1:Support Structures Medium] [2:Support Structures Small] [3:Support Structures Small] Exudate A mount: [1:Serous] [2:Serous] [3:Serous] Exudate Type: [1:amber] [2:amber] [3:amber] Exudate Color: [1:Well defined, not attached] [2:Well defined, not attached] [3:Well defined, not attached] Wound Margin: [1:Medium (34-66%)] [2:Medium (34-66%)] [3:Small (1-33%)] Granulation Amount: [1:Pink] [2:Pink] [3:Pink] Granulation Quality: [1:Medium (34-66%)] [2:Medium (34-66%)] [3:Large (67-100%)] Necrotic Amount: [1:Adherent Slough] [2:Adherent Slough] [3:Adherent Slough] Necrotic Tissue: [1:Fat Layer (Subcutaneous Tissue): Yes Fat Layer  (Subcutaneous Tissue): Yes Fat Layer (Subcutaneous Tissue): Yes] Exposed Structures: [1:Fascia: No Tendon: No Muscle: No Joint: No Bone: No None] [2:Fascia: No Tendon: No Muscle: No Joint: No Bone: No None] [3:Fascia: No Tendon: No Muscle: No Joint: No Bone: No None] Wound Number: 4 5 N/A Photos: No Photos No Photos N/A Right, Medial Lower Leg Right T Second oe N/A Wound Location: Gradually Appeared Gradually Appeared N/A Wounding Event: Venous Leg Ulcer Diabetic Wound/Ulcer of the Lower N/A Primary Etiology: Extremity Diabetic Wound/Ulcer of the Lower N/A N/A Secondary Etiology: Extremity Cataracts, Chronic Obstructive Cataracts, Chronic Obstructive N/A Comorbid History: Pulmonary Disease (COPD), Pulmonary Disease (COPD), Congestive Heart Failure, Congestive Heart Failure, Hypertension, Peripheral Venous Hypertension, Peripheral Venous Disease, Type II Diabetes, Disease, Type II Diabetes, Neuropathy Neuropathy 01/21/2019 01/05/2020 N/A Date Acquired: 1 0 N/A Weeks of Treatment: Open Open N/A Wound Status: 2.5x2.1x0.3 0.6x0.6x0.1 N/A Measurements L x W x D (cm) 4.123 0.283 N/A A (cm) : rea 1.237 0.028 N/A Volume (cm) : -15.10% N/A N/A % Reduction in Area: 61.60% N/A N/A % Reduction in Volume: Full Thickness Without Exposed Grade 2 N/A Classification: Support Structures Medium Medium N/A Exudate A mount: Serous Serosanguineous N/A Exudate Type: amber red, brown N/A Exudate Color: Well defined, not attached N/A N/A Wound Margin: Small (1-33%) Medium (34-66%) N/A Granulation Amount: Pink Pink N/A Granulation Quality: Large (67-100%) Medium (34-66%) N/A Necrotic Amount: Eschar, Adherent Slough Adherent Slough N/A Necrotic Tissue: Fat Layer (Subcutaneous Tissue): Yes Fat Layer (Subcutaneous Tissue): Yes N/A Exposed Structures: Fascia: No Fascia: No Tendon: No Tendon: No Muscle: No Muscle: No Joint: No Joint: No Bone: No Bone: No None None  N/A Epithelialization: Treatment Notes Electronic Signature(s) Signed: 01/08/2020 5:36:57 PM By: Zandra Abts RN, BSN Signed: 01/08/2020 5:57:44 PM By: Baltazar Najjar MD Entered By: Baltazar Najjar on 01/08/2020 17:00:16 -------------------------------------------------------------------------------- Multi-Disciplinary Care Plan Details Patient Name: Date of Service: YAZLEEMAR, STRASSNER 01/08/2020 3:30 PM Medical Record Number: [id number] Patient Account Number: [id number] Date of Birth/Sex: Treating RN: 06-Jul-1950 (69 Patricia [name].o. Female) Zandra Abts Primary Care Philippe Gang: Other Clinician: Referring Rihanna Marseille: Treating Kailee Essman/Extender: Valentino Saxon in Treatment: 1 Active Inactive Abuse / Safety / Falls / Self Care Management Nursing Diagnoses: Potential for falls Potential for injury related to falls Goals: Patient will not experience any injury related to falls Date Initiated: 01/01/2020 Target Resolution Date: 01/30/2020 Goal Status: Active Patient/caregiver will verbalize/demonstrate measures taken to prevent injury and/or falls Date Initiated: 01/01/2020 Target Resolution Date: 01/30/2020 Goal Status: Active Interventions: Assess Activities of Daily Living upon admission and as needed Assess fall risk on admission and as needed Assess: immobility, friction, shearing, incontinence upon admission and as needed Assess impairment of mobility on admission and as needed per policy Assess personal safety and home safety (as indicated) on admission and as needed Assess  self care needs on admission and as needed Provide education on fall prevention Provide education on personal and home safety Notes: Nutrition Nursing Diagnoses: Impaired glucose control: actual or potential Potential for alteratiion in Nutrition/Potential for imbalanced nutrition Goals: Patient/caregiver agrees to and verbalizes understanding of need to use nutritional supplements and/or vitamins as  prescribed Date Initiated: 01/01/2020 Target Resolution Date: 01/30/2020 Goal Status: Active Patient/caregiver will maintain therapeutic glucose control Date Initiated: 01/01/2020 Target Resolution Date: 01/30/2020 Goal Status: Active Interventions: Assess HgA1c results as ordered upon admission and as needed Assess patient nutrition upon admission and as needed per policy Provide education on elevated blood sugars and impact on wound healing Provide education on nutrition Treatment Activities: Education provided on Nutrition : 01/01/2020 Notes: Venous Leg Ulcer Nursing Diagnoses: Knowledge deficit related to disease process and management Potential for venous Insuffiency (use before diagnosis confirmed) Goals: Patient/caregiver will verbalize understanding of disease process and disease management Date Initiated: 01/01/2020 Target Resolution Date: 01/30/2020 Goal Status: Active Interventions: Assess peripheral edema status every visit. Compression as ordered Provide education on venous insufficiency Notes: Wound/Skin Impairment Nursing Diagnoses: Impaired tissue integrity Knowledge deficit related to smoking impact on wound healing Knowledge deficit related to ulceration/compromised skin integrity Goals: Patient will demonstrate a reduced rate of smoking or cessation of smoking Date Initiated: 01/01/2020 Target Resolution Date: 01/30/2020 Goal Status: Active Patient/caregiver will verbalize understanding of skin care regimen Date Initiated: 01/01/2020 Target Resolution Date: 01/30/2020 Goal Status: Active Interventions: Assess patient/caregiver ability to obtain necessary supplies Assess patient/caregiver ability to perform ulcer/skin care regimen upon admission and as needed Assess ulceration(s) every visit Provide education on ulcer and skin care Notes: Electronic Signature(s) Signed: 01/08/2020 5:36:57 PM By: Zandra Abts RN, BSN Entered By: Zandra Abts on 01/08/2020  16:42:23 -------------------------------------------------------------------------------- Pain Assessment Details Patient Name: Date of Service: Patricia [name], Patricia [name] 01/08/2020 3:30 PM Medical Record Number: [id number] Patient Account Number: [id number] Date of Birth/Sex: Treating RN: 1950/10/06 (69 Patricia [name].o. Female) Yevonne Pax Primary Care Caedmon Louque: Other Clinician: Referring Ambrie Carte: Treating Leliana Kontz/Extender: Valentino Saxon in Treatment: 1 Active Problems Location of Pain Severity and Description of Pain Patient Has Paino No Site Locations Pain Management and Medication Current Pain Management: Electronic Signature(s) Signed: 01/16/2020 5:50:16 PM By: Yevonne Pax RN Entered By: Yevonne Pax on 01/08/2020 15:55:11 -------------------------------------------------------------------------------- Patient/Caregiver Education Details Patient Name: Date of Service: Patricia [name] 8/19/2021andnbsp3:30 PM Medical Record Number: [id number] Patient Account Number: [id number] Date of Birth/Gender: Treating RN: 30-Jun-1950 (69 Patricia [name].o. Female) Zandra Abts Primary Care Physician: Other Clinician: Referring Physician: Treating Physician/Extender: Valentino Saxon in Treatment: 1 Education Assessment Education Provided To: Patient Education Topics Provided Tissue Oxygenation: Methods: Explain/Verbal Responses: State content correctly Wound/Skin Impairment: Methods: Explain/Verbal Responses: State content correctly Electronic Signature(s) Signed: 01/08/2020 5:36:57 PM By: Zandra Abts RN, BSN Entered By: Zandra Abts on 01/08/2020 16:42:46 -------------------------------------------------------------------------------- Wound Assessment Details Patient Name: Date of Service: Patricia [name], Patricia [name] 01/08/2020 3:30 PM Medical Record Number: [id number] Patient Account Number: [id number] Date of Birth/Sex: Treating RN: 07-May-1951 (69 Patricia [name].o. Female) Yevonne Pax Primary Care  Yobani Schertzer: Other Clinician: Referring Tamula Morrical: Treating Vernel Langenderfer/Extender: Valentino Saxon in Treatment: 1 Wound Status Wound Number: 1 Primary Venous Leg Ulcer Etiology: Wound Location: Left, Anterior Lower Leg Secondary Diabetic Wound/Ulcer of the Lower Extremity Wounding Event: Gradually Appeared Etiology: Date Acquired: 09/20/2019 Wound Open Weeks Of Treatment: 1 Status: Clustered Wound: No Comorbid Cataracts, Chronic Obstructive Pulmonary Disease (COPD), History: Congestive Heart Failure, Hypertension, Peripheral Venous Disease, Type II Diabetes, Neuropathy Photos Photo Uploaded By: Benjaman Kindler on 01/12/2020 14:45:22  Wound Measurements Length: (cm) 0.7 Width: (cm) 0.5 Depth: (cm) 0.2 Area: (cm) 0.275 Volume: (cm) 0.055 % Reduction in Area: 51.3% % Reduction in Volume: 51.3% Epithelialization: None Tunneling: No Undermining: No Wound Description Classification: Full Thickness Without Exposed Support Struct Wound Margin: Well defined, not attached Exudate Amount: Medium Exudate Type: Serous Exudate Color: amber ures Foul Odor After Cleansing: No Slough/Fibrino Yes Wound Bed Granulation Amount: Medium (34-66%) Exposed Structure Granulation Quality: Pink Fascia Exposed: No Necrotic Amount: Medium (34-66%) Fat Layer (Subcutaneous Tissue) Exposed: Yes Necrotic Quality: Adherent Slough Tendon Exposed: No Muscle Exposed: No Joint Exposed: No Bone Exposed: No Treatment Notes Wound #1 (Left, Anterior Lower Leg) 1. Cleanse With Wound Cleanser Soap and water 3. Primary Dressing Applied Santyl 4. Secondary Dressing Dry Gauze Roll Gauze 5. Secured With Secretary/administrator) Signed: 01/16/2020 5:50:16 PM By: Yevonne Pax RN Entered By: Yevonne Pax on 01/08/2020 16:02:19 -------------------------------------------------------------------------------- Wound Assessment Details Patient Name: Date of Service: Patricia [name], Patricia [name] 01/08/2020 3:30  PM Medical Record Number: [id number] Patient Account Number: [id number] Date of Birth/Sex: Treating RN: 05/14/1951 (69 Patricia [name].o. Female) Epps, Carrie Primary Care Briasia Flinders: Other Clinician: Referring Jule Schlabach: Treating Tayja Manzer/Extender: Valentino Saxon in Treatment: 1 Wound Status Wound Number: 2 Primary Venous Leg Ulcer Etiology: Wound Location: Right, Dorsal Foot Secondary Diabetic Wound/Ulcer of the Lower Extremity Wounding Event: Gradually Appeared Etiology: Date Acquired: 10/21/2019 Wound Open Weeks Of Treatment: 1 Status: Clustered Wound: No Comorbid Cataracts, Chronic Obstructive Pulmonary Disease (COPD), History: Congestive Heart Failure, Hypertension, Peripheral Venous Disease, Type II Diabetes, Neuropathy Photos Photo Uploaded By: Benjaman Kindler on 01/12/2020 14:50:15 Wound Measurements Length: (cm) 0.5 Width: (cm) 0.9 Depth: (cm) 0.2 Area: (cm) 0.353 Volume: (cm) 0.071 % Reduction in Area: 44.5% % Reduction in Volume: 44.1% Epithelialization: None Tunneling: No Undermining: No Wound Description Classification: Full Thickness Without Exposed Support Structures Wound Margin: Well defined, not attached Exudate Amount: Small Exudate Type: Serous Exudate Color: amber Foul Odor After Cleansing: No Slough/Fibrino Yes Wound Bed Granulation Amount: Medium (34-66%) Exposed Structure Granulation Quality: Pink Fascia Exposed: No Necrotic Amount: Medium (34-66%) Fat Layer (Subcutaneous Tissue) Exposed: Yes Necrotic Quality: Adherent Slough Tendon Exposed: No Muscle Exposed: No Joint Exposed: No Bone Exposed: No Treatment Notes Wound #2 (Right, Dorsal Foot) 1. Cleanse With Wound Cleanser Soap and water 3. Primary Dressing Applied Santyl 4. Secondary Dressing Dry Gauze Roll Gauze 5. Secured With Secretary/administrator) Signed: 01/16/2020 5:50:16 PM By: Yevonne Pax RN Entered By: Yevonne Pax on 01/08/2020  16:02:35 -------------------------------------------------------------------------------- Wound Assessment Details Patient Name: Date of Service: Patricia [name], Patricia [name] 01/08/2020 3:30 PM Medical Record Number: [id number] Patient Account Number: [id number] Date of Birth/Sex: Treating RN: [birth year] (69 Patricia [name].o. Female) Epps, Carrie Primary Care Marycatherine Maniscalco: Other Clinician: Referring Francois Elk: Treating Shanae Luo/Extender: Valentino Saxon in Treatment: 1 Wound Status Wound Number: 3 Primary Venous Leg Ulcer Etiology: Wound Location: Right, Distal, Anterior Lower Leg Secondary Diabetic Wound/Ulcer of the Lower Extremity Wounding Event: Gradually Appeared Etiology: Date Acquired: 10/21/2019 Wound Open Weeks Of Treatment: 1 Status: Clustered Wound: No Comorbid Cataracts, Chronic Obstructive Pulmonary Disease (COPD), History: Congestive Heart Failure, Hypertension, Peripheral Venous Disease, Type II Diabetes, Neuropathy Photos Photo Uploaded By: Benjaman Kindler on 01/12/2020 14:46:03 Wound Measurements Length: (cm) 0.3 Width: (cm) 0.7 Depth: (cm) 0.1 Area: (cm) 0.165 Volume: (cm) 0.016 % Reduction in Area: 47.5% % Reduction in Volume: 74.6% Epithelialization: None Tunneling: No Undermining: No Wound Description Classification: Full Thickness Without Exposed Support Structu Wound Margin: Well defined, not attached Exudate Amount: Small Exudate Type: Serous Exudate Color:  amber res [employer] After Cleansing: No Slough/Fibrino Yes Wound Bed Granulation Amount: Small (1-33%) Exposed Structure Granulation Quality: Pink Fascia Exposed: No Necrotic Amount: Large (67-100%) Fat Layer (Subcutaneous Tissue) Exposed: Yes Necrotic Quality: Adherent Slough Tendon Exposed: No Muscle Exposed: No Joint Exposed: No Bone Exposed: No Treatment Notes Wound #3 (Right, Distal, Anterior Lower Leg) 1. Cleanse With Wound Cleanser Soap and water 3. Primary Dressing Applied Santyl 4.  Secondary Dressing Dry Gauze Roll Gauze 5. Secured With Secretary/administrator) Signed: 01/16/2020 5:50:16 PM By: Yevonne Pax RN Entered By: Yevonne Pax on 01/08/2020 16:02:46 -------------------------------------------------------------------------------- Wound Assessment Details Patient Name: Date of Service: Patricia [name], Patricia [name] 01/08/2020 3:30 PM Medical Record Number: [id number] Patient Account Number: [id number] Date of Birth/Sex: Treating RN: [birth year] (69 Patricia [name].o. Female) Epps, Carrie Primary Care Kyzer Blowe: Other Clinician: Referring Emmersyn Kratzke: Treating Tisheena Maguire/Extender: Valentino Saxon in Treatment: 1 Wound Status Wound Number: 4 Primary Venous Leg Ulcer Etiology: Wound Location: Right, Medial Lower Leg Secondary Diabetic Wound/Ulcer of the Lower Extremity Wounding Event: Gradually Appeared Etiology: Date Acquired: 01/21/2019 Wound Open Weeks Of Treatment: 1 Status: Clustered Wound: No Comorbid Cataracts, Chronic Obstructive Pulmonary Disease (COPD), History: Congestive Heart Failure, Hypertension, Peripheral Venous Disease, Type II Diabetes, Neuropathy Photos Photo Uploaded By: Benjaman Kindler on 01/12/2020 14:46:04 Wound Measurements Length: (cm) 2.5 Width: (cm) 2.1 Depth: (cm) 0.3 Area: (cm) 4.123 Volume: (cm) 1.237 % Reduction in Area: -15.1% % Reduction in Volume: 61.6% Epithelialization: None Tunneling: No Undermining: No Wound Description Classification: Full Thickness Without Exposed Support Structures Wound Margin: Well defined, not attached Exudate Amount: Medium Exudate Type: Serous Exudate Color: amber Foul Odor After Cleansing: No Slough/Fibrino Yes Wound Bed Granulation Amount: Small (1-33%) Exposed Structure Granulation Quality: Pink Fascia Exposed: No Necrotic Amount: Large (67-100%) Fat Layer (Subcutaneous Tissue) Exposed: Yes Necrotic Quality: Eschar, Adherent Slough Tendon Exposed: No Muscle Exposed: No Joint Exposed:  No Bone Exposed: No Treatment Notes Wound #4 (Right, Medial Lower Leg) 1. Cleanse With Wound Cleanser Soap and water 3. Primary Dressing Applied Santyl 4. Secondary Dressing Dry Gauze Roll Gauze 5. Secured With Secretary/administrator) Signed: 01/16/2020 5:50:16 PM By: Yevonne Pax RN Entered By: Yevonne Pax on 01/08/2020 16:03:20 -------------------------------------------------------------------------------- Wound Assessment Details Patient Name: Date of Service: Patricia [name], Patricia [name] 01/08/2020 3:30 PM Medical Record Number: [id number] Patient Account Number: [id number] Date of Birth/Sex: Treating RN: 1950/07/05 (69 Patricia [name].o. Female) Epps, Carrie Primary Care Yuriy Cui: Other Clinician: Referring Rashaun Wichert: Treating Lam Bjorklund/Extender: Valentino Saxon in Treatment: 1 Wound Status Wound Number: 5 Primary Diabetic Wound/Ulcer of the Lower Extremity Etiology: Wound Location: Right T Second oe Wound Open Wounding Event: Gradually Appeared Status: Date Acquired: 01/05/2020 Comorbid Cataracts, Chronic Obstructive Pulmonary Disease (COPD), Weeks Of Treatment: 0 History: Congestive Heart Failure, Hypertension, Peripheral Venous Clustered Wound: No Disease, Type II Diabetes, Neuropathy Photos Photo Uploaded By: Benjaman Kindler on 01/12/2020 14:45:22 Wound Measurements Length: (cm) 0.6 Width: (cm) 0.6 Depth: (cm) 0.1 Area: (cm) 0.283 Volume: (cm) 0.028 % Reduction in Area: % Reduction in Volume: Epithelialization: None Tunneling: No Undermining: No Wound Description Classification: Grade 2 Exudate Amount: Medium Exudate Type: Serosanguineous Exudate Color: red, brown Wound Bed Granulation Amount: Medium (34-66%) Granulation Quality: Pink Necrotic Amount: Medium (34-66%) Necrotic Quality: Adherent Slough Foul Odor After Cleansing: No Slough/Fibrino Yes Exposed Structure Fascia Exposed: No Fat Layer (Subcutaneous Tissue) Exposed: Yes Tendon Exposed:  No Muscle Exposed: No Joint Exposed: No Bone Exposed: No Treatment Notes Wound #5 (Right Toe Second) 1. Cleanse With Wound Cleanser Soap and water 3. Primary Dressing Applied Santyl  4. Secondary Dressing Dry Gauze Roll Gauze 5. Secured With Secretary/administrator) Signed: 01/16/2020 5:50:16 PM By: Yevonne Pax RN Entered By: Yevonne Pax on 01/08/2020 16:05:08 -------------------------------------------------------------------------------- Vitals Details Patient Name: Date of Service: ABRIANNA, BEARMAN 01/08/2020 3:30 PM Medical Record Number: [id number] Patient Account Number: [id number] Date of Birth/Sex: Treating RN: Oct 10, 1950 (69 Patricia [name].o. Female) Epps, Carrie Primary Care Kasy Iannacone: Other Clinician: Referring Ciera Beckum: Treating Mekayla Soman/Extender: Valentino Saxon in Treatment: 1 Vital Signs Time Taken: 15:54 Temperature (F): 98.2 Height (in): 64 Pulse (bpm): 71 Weight (lbs): 172 Respiratory Rate (breaths/min): 18 Body Mass Index (BMI): 29.5 Blood Pressure (mmHg): 135/68 Reference Range: 80 - 120 mg / dl Electronic Signature(s) Signed: 01/16/2020 5:50:16 PM By: Yevonne Pax RN Entered By: Yevonne Pax on 01/08/2020 15:55:04

## 2020-01-22 ENCOUNTER — Encounter (HOSPITAL_BASED_OUTPATIENT_CLINIC_OR_DEPARTMENT_OTHER): Payer: Medicare Other | Attending: Internal Medicine | Admitting: Internal Medicine

## 2020-01-22 DIAGNOSIS — Z885 Allergy status to narcotic agent status: Secondary | ICD-10-CM | POA: Insufficient documentation

## 2020-01-22 DIAGNOSIS — I5032 Chronic diastolic (congestive) heart failure: Secondary | ICD-10-CM | POA: Diagnosis not present

## 2020-01-22 DIAGNOSIS — E1151 Type 2 diabetes mellitus with diabetic peripheral angiopathy without gangrene: Secondary | ICD-10-CM | POA: Diagnosis not present

## 2020-01-22 DIAGNOSIS — E1142 Type 2 diabetes mellitus with diabetic polyneuropathy: Secondary | ICD-10-CM | POA: Diagnosis not present

## 2020-01-22 DIAGNOSIS — J449 Chronic obstructive pulmonary disease, unspecified: Secondary | ICD-10-CM | POA: Diagnosis not present

## 2020-01-22 DIAGNOSIS — L97212 Non-pressure chronic ulcer of right calf with fat layer exposed: Secondary | ICD-10-CM | POA: Diagnosis not present

## 2020-01-22 DIAGNOSIS — I110 Hypertensive heart disease with heart failure: Secondary | ICD-10-CM | POA: Insufficient documentation

## 2020-01-22 DIAGNOSIS — E11622 Type 2 diabetes mellitus with other skin ulcer: Secondary | ICD-10-CM | POA: Diagnosis present

## 2020-01-22 NOTE — Progress Notes (Signed)
MARGAURITE, STULLER ([id number]) Visit Report for 01/22/2020 Debridement Details Patient Name: Date of Service: FARHA, MESSMAN 01/22/2020 11:00 A M Medical Record Number: [id number] Patient Account Number: [id number] Date of Birth/Sex: Treating RN: December 16, 1950 (69 y.o. Wynelle Link Primary Care Provider: Other Clinician: Referring Provider: Treating Provider/Extender: Valentino Saxon in Treatment: 3 Debridement Performed for Assessment: Wound #4 Right,Medial Lower Leg Performed By: Physician Maxwell Caul., MD Debridement Type: Debridement Severity of Tissue Pre Debridement: Fat layer exposed Level of Consciousness (Pre-procedure): Awake and Alert Pre-procedure Verification/Time Out Yes - 11:58 Taken: Start Time: 11:58 T Area Debrided (L x W): otal 2.7 (cm) x 2.2 (cm) = 5.94 (cm) Tissue and other material debrided: Viable, Non-Viable, Slough, Slough Level: Non-Viable Tissue Debridement Description: Selective/Open Wound Instrument: Curette Bleeding: Minimum Hemostasis Achieved: Pressure End Time: 12:00 Procedural Pain: 0 Post Procedural Pain: 0 Response to Treatment: Procedure was tolerated well Level of Consciousness (Post- Awake and Alert procedure): Post Debridement Measurements of Total Wound Length: (cm) 2.7 Width: (cm) 2.2 Depth: (cm) 0.3 Volume: (cm) 1.4 Character of Wound/Ulcer Post Debridement: Requires Further Debridement Severity of Tissue Post Debridement: Fat layer exposed Post Procedure Diagnosis Same as Pre-procedure Electronic Signature(s) Signed: 01/22/2020 5:08:15 PM By: Zandra Abts RN, BSN Signed: 01/22/2020 5:13:24 PM By: Baltazar Najjar MD Entered By: Zandra Abts on 01/22/2020 12:01:48 -------------------------------------------------------------------------------- HPI Details Patient Name: Date of Service: DONSHAE, MONICO 01/22/2020 11:00 A M Medical Record Number: [id number] Patient Account Number: [id number] Date of Birth/Sex:  Treating RN: 09/04/1950 (69 y.o. 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 Patient has most symptoms from the right medial calf wound with pain and discomfort. She has been putting Neosporin ointment on this wound and on the other wounds on the right and left legs. ABI in the clinic was unobtainable Patient's history significant for type 2 diabetes poorly controlled with the latest A1c of 11.8, ongoing tobacco abuse, COPD-non-O2 dependent, CHF/chronic diastolic heart failure, Patient denies any fevers or chills, denies any other symptoms other than discomfort in the legs due to the wounds. 8/19; this is a patient who is a type II diabetic and a smoker. She was admitted to clinic last week with bilateral small punched-out painful wounds on her bilateral lower extremities and feet. Her ABIs in the clinic were 0.45 on the left and noncompressible on the right. She lives in [place] city and requires public transportation to get her back and forth. We attempted to arrange for arterial studies at the hospital in [place] but we do not have any information on this. We also ordered Santyl from [employer] she did not get that either. She has been applying a and D ointment to the wounds In speaking to the patient she is a minimal ambulator limited by both pain in her legs which may be claudication although she has apparently lumbar spinal stenosis. She tells me the maximum amount of walking she can do is 4  minutes while walking her dog 9/2; the patient finally had her arterial studies at  [facility]. They were read by Dr. Rosalee Kaufman Radiology at [facility]. As it turns out she also had prior studies that I did not know about in August 2018. At that point her ABI on the right was 0.7. Noted to have calcific plaque at the common femoral bifurcation with mildly elevated systolic peak pressures focally elevated peak systolic velocities in the distal SFA and monophasic waveforms in the popliteal artery and runoff vessels. On the left her ABI was 0.5 plaque in the common femoral artery segmental occlusion of the mid SFA with tandem areas of stenosis resulting in markedly increased peak flow velocities focally in the mid segment monophasic arterial waveforms in the distal popliteal and runoff vessels. She did not have further evaluation that I am aware of in 2018 In any case her repeat studies. They did not do a right ABI. The evaluation suggested possible distal common femoral artery stenosis and probable diffuse disease throughout the superficial femoral artery popliteal artery and tibial arteries. There were monophasic waveforms in the superficial femoral artery on the right and left. Popliteal arteries showed monophasic waveforms bilaterally. There is no question in my mind she will require some form of angiography if not standard asked angiography then perhaps CT angiography The patient is a minimal ambulator. Her wounds are not making much progress. She could not afford the Santyl and there therefore is using Medihoney. Electronic Signature(s) Signed: 01/22/2020 5:13:24 PM By: Baltazar Najjar MD Entered By: Baltazar Najjar on 01/22/2020 12:22:08 -------------------------------------------------------------------------------- Physical Exam Details Patient Name: Date of Service: CALY, PELLUM 01/22/2020 11:00 A M Medical Record Number: [id number] Patient Account Number: [id number] Date of Birth/Sex:  Treating RN: Mar 31, 1951 (69 y.o. Wynelle Link Primary Care Provider: Other Clinician: Referring Provider: Treating Provider/Extender: Valentino Saxon in Treatment: 3 Constitutional Sitting or standing Blood Pressure is within target range for patient.. Pulse regular and within target range for patient.Marland Kitchen Respirations regular, non-labored and within target range.. Temperature is normal and within the target range for the patient.Marland Kitchen Appears in no distress. Cardiovascular Popliteal pulses are difficult to feel. I could not feel femoral pulses through her clothing. Pedal pulses absent bilaterally.. Notes Wound exam; the patient's necrotic wound on the right medial calf required debridement today lightly with a #5 curette I am simply room moving slough at this point I cannot get down to a healthy surface. Right second toe and foot dorsally do not look much different either. She has an area on the left lower leg again small and punched out. Electronic Signature(s) Signed: 01/22/2020 5:13:24 PM By: Baltazar Najjar MD Entered By: Baltazar Najjar on 01/22/2020 12:23:12 -------------------------------------------------------------------------------- Physician Orders Details Patient Name: Date of Service: SALLEE, HOGREFE 01/22/2020 11:00 A M Medical Record Number: [id number] Patient Account Number: [id number] Date of Birth/Sex: Treating RN: [birth year] (69 y.o. 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 Dressing Change Frequency Change dressing every day.  - all wounds - home health to change twice a week, pt to change all other days Wound Cleansing May shower and wash wound with soap and water. Primary Wound Dressing Wound #1 Left,Anterior Lower Leg Medihoney gel Wound #2 Right,Dorsal Foot Medihoney gel Wound #3 Right,Distal,Anterior Lower  Leg Medihoney gel Wound #4 Right,Medial Lower Leg Medihoney gel Wound #5 Right T Second oe Medihoney gel Secondary Dressing Wound #1 Left,Anterior Lower Leg Foam Border - or gauze and tape Wound #2 Right,Dorsal Foot Foam Border - or gauze and tape Wound #3 Right,Distal,Anterior Lower Leg Foam Border - or gauze and tape Wound #4 Right,Medial Lower Leg Foam Border - or gauze and tape Wound #5 Right T Second oe Dry Gauze - or bandaid Home Health Continue Home Health skilled nursing for wound care. [facility] (Fax: [phone]) Phone: [phone] [employer] [facility] Interventional Radiology [facility]) - Abnormal arterial study, non palpable pulses, necrotic ulcers on bilateral lower extremities - (ICD10 E11.42 - Type 2 diabetes mellitus with diabetic polyneuropathy) Electronic Signature(s) Signed: 01/22/2020 5:08:15 PM By: Zandra Abts RN, BSN Signed: 01/22/2020 5:13:24 PM By: Baltazar Najjar MD Signed: 01/22/2020 5:13:24 PM By: Baltazar Najjar MD Entered By: Zandra Abts on 01/22/2020 12:05:59 Prescription 01/22/2020 -------------------------------------------------------------------------------- Burkman, Jaira H. Baltazar Najjar MD Patient Name: Provider: April 03, 1951 [id number] Date of Birth: NPI#: [id number] Sex: DEA #: [phone] [id number] Phone #: License #: Eligha Bridegroom [facility] Patient Address: [address] [place], [place] [address] [address] [place], [place] [address] [phone] Allergies Panlor (hydrocodone-acetamin) Provider's Orders Interventional Radiology [facility]) - ICD10: E11.42 -  Abnormal arterial study, non palpable pulses, necrotic ulcers on bilateral lower extremities Hand Signature: Date(s): Electronic Signature(s) Signed: 01/22/2020 5:08:15 PM By: Zandra Abts RN, BSN Signed: 01/22/2020 5:13:24 PM By: Baltazar Najjar MD Entered By: Zandra Abts on 01/22/2020 12:06:00 -------------------------------------------------------------------------------- Problem List Details Patient Name: Date of Service: YURIANA, GAAL 01/22/2020 11:00 A M Medical Record Number: [id number] Patient Account Number: [id number] Date of Birth/Sex: Treating RN: 19-May-1951 (69 y.o. 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 01/01/2020 No Yes E11.51 Type 2 diabetes mellitus with diabetic peripheral angiopathy without gangrene 01/22/2020 No Yes E11.42 Type 2 diabetes mellitus with diabetic polyneuropathy 01/01/2020 No Yes Inactive Problems Resolved Problems Electronic Signature(s) Signed: 01/22/2020 5:13:24 PM By: Baltazar Najjar MD Entered By: Baltazar Najjar on 01/22/2020 12:26:29 -------------------------------------------------------------------------------- Progress Note Details Patient Name: Date of Service: [name] 01/22/2020 11:00 A M Medical Record Number: [id number] Patient Account Number: [id number] Date of Birth/Sex: Treating RN: 1950-06-03 (69 y.o. 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 Patient has most symptoms from the right medial calf wound with pain and discomfort. She has been putting Neosporin ointment on this wound and on the other wounds on the right and left legs. ABI in the clinic was unobtainable Patient's history significant for type 2 diabetes poorly controlled with the latest A1c of 11.8, ongoing tobacco abuse, COPD-non-O2 dependent, CHF/chronic diastolic heart failure, Patient denies any fevers or chills, denies  any other symptoms other than discomfort in the legs due to the wounds. 8/19; this is a patient who is a type II diabetic and a smoker. She was admitted to clinic last week with bilateral small punched-out painful wounds on her bilateral lower extremities and feet. Her ABIs in the clinic were 0.45 on the left and noncompressible on the right. She [place] in [place] and requires public transportation to get her back and forth. We attempted to arrange for arterial studies at the hospital in [place] city but we do not have any information on this. We also ordered Santyl from [employer] she did not get that either. She has been applying a and D ointment to the wounds In speaking to the patient she is a minimal ambulator limited by both pain in her legs which may be claudication although she has apparently lumbar spinal stenosis. She tells me the maximum amount of walking she can do is 4 minutes while walking her dog 9/2; the patient finally had her arterial studies at [facility]. They were read by Dr. Rosalee Kaufman Radiology at [facility]. As it turns out she also had prior studies that I did not know about in August 2018. At that point her ABI on the right was 0.7. Noted to have calcific plaque at  the common femoral bifurcation with mildly elevated systolic peak pressures focally elevated peak systolic velocities in the distal SFA and monophasic waveforms in the popliteal artery and runoff vessels. On the left her ABI was 0.5 plaque in the common femoral artery segmental occlusion of the mid SFA with tandem areas of stenosis resulting in markedly increased peak flow velocities focally in the mid segment monophasic arterial waveforms in the distal popliteal and runoff vessels. She did not have further evaluation that I am aware of in 2018 In any case her repeat studies. They did not do a right ABI. The evaluation suggested possible distal common femoral artery stenosis and probable diffuse disease throughout the superficial femoral artery popliteal artery and tibial arteries. There were monophasic waveforms in the superficial femoral artery on the right and left. Popliteal arteries showed monophasic waveforms bilaterally. There is no question in my mind she will require some form of angiography if not standard asked angiography then perhaps CT angiography The patient is a minimal ambulator. Her wounds are not making much progress. She could not afford the Santyl and there therefore is using Medihoney. Objective Constitutional Sitting or standing Blood Pressure is within target range for patient.. Pulse regular and within target range for patient.Marland Kitchen Respirations regular, non-labored and within target range.. Temperature is normal and within the target range for the patient.Marland Kitchen Appears in no distress. Vitals Time Taken: 11:22 AM, Height: 64 in, Weight: 172 lbs, BMI: 29.5, Temperature: 98.1 F, Pulse: 67 bpm, Respiratory Rate: 18 breaths/min, Blood Pressure: 120/53 mmHg. Cardiovascular Popliteal pulses are difficult to feel. I could not feel femoral pulses through her clothing. Pedal pulses absent bilaterally.. General Notes: Wound exam; the patient's necrotic wound on the right medial calf  required debridement today lightly with a #5 curette I am simply room moving slough at this point I cannot get down to a healthy surface. ooRight second toe and foot dorsally do not look much different either. ooShe has an area on the left lower leg again small and punched out. Integumentary (Hair, Skin) Wound #1 status is Open. Original cause of wound was Gradually Appeared. The wound is located on the Left,Anterior Lower Leg. The wound measures 0.7cm  length x 0.5cm width x 0.1cm depth; 0.275cm^2 area and 0.027cm^3 volume. There is Fat Layer (Subcutaneous Tissue) exposed. There is no tunneling or undermining noted. There is a small amount of serous drainage noted. The wound margin is flat and intact. There is large (67-100%) pink granulation within the wound bed. There is no necrotic tissue within the wound bed. Wound #2 status is Open. Original cause of wound was Gradually Appeared. The wound is located on the Right,Dorsal Foot. The wound measures 0.6cm length x 0.7cm width x 0.2cm depth; 0.33cm^2 area and 0.066cm^3 volume. There is Fat Layer (Subcutaneous Tissue) exposed. There is no tunneling or undermining noted. There is a small amount of serous drainage noted. The wound margin is flat and intact. There is medium (34-66%) pink granulation within the wound bed. There is a medium (34-66%) amount of necrotic tissue within the wound bed including Adherent Slough. Wound #3 status is Open. Original cause of wound was Gradually Appeared. The wound is located on the Right,Distal,Anterior Lower Leg. The wound measures 0.3cm length x 0.6cm width x 0.1cm depth; 0.141cm^2 area and 0.014cm^3 volume. There is Fat Layer (Subcutaneous Tissue) exposed. There is no tunneling or undermining noted. There is a small amount of serous drainage noted. The wound margin is well defined and not attached to the wound base. There is no granulation within the wound bed. There is a large (67-100%) amount of necrotic tissue  within the wound bed including Adherent Slough. Wound #4 status is Open. Original cause of wound was Gradually Appeared. The wound is located on the Right,Medial Lower Leg. The wound measures 2.7cm length x 2.2cm width x 0.3cm depth; 4.665cm^2 area and 1.4cm^3 volume. There is Fat Layer (Subcutaneous Tissue) exposed. There is no tunneling or undermining noted. There is a medium amount of serous drainage noted. The wound margin is well defined and not attached to the wound base. There is no granulation within the wound bed. There is a large (67-100%) amount of necrotic tissue within the wound bed including Adherent Slough. Wound #5 status is Open. Original cause of wound was Gradually Appeared. The wound is located on the Right T Second. The wound measures 0.3cm length oe x 0.3cm width x 0.1cm depth; 0.071cm^2 area and 0.007cm^3 volume. There is Fat Layer (Subcutaneous Tissue) exposed. There is no tunneling or undermining noted. There is a small amount of serous drainage noted. The wound margin is flat and intact. There is no granulation within the wound bed. There is a large (67- 100%) amount of necrotic tissue within the wound bed including Adherent Slough. 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 There was a Selective/Open Wound Non-Viable Tissue Debridement with a total area of 5.94 sq cm performed by Maxwell Caul., MD. With the following instrument(s): Curette to remove Viable and Non-Viable tissue/material. Material removed includes Slough. No specimens were taken. A time out was conducted at 11:58, prior to the start of the procedure. A  Minimum amount of bleeding was controlled with Pressure. The procedure was tolerated well with a pain level of 0 throughout and a pain level of 0 following the procedure. Post Debridement Measurements: 2.7cm length x 2.2cm width x 0.3cm depth; 1.4cm^3 volume. Character of  Wound/Ulcer Post Debridement requires further debridement. Severity of Tissue Post Debridement is: Fat layer exposed. Post procedure Diagnosis Wound #4: Same as Pre-Procedure Plan Follow-up Appointments: Return Appointment in 2 weeks. Dressing Change Frequency: Change dressing every day. - all wounds - home health to change twice a week, pt to change all other days Wound Cleansing: May shower and wash wound with soap and water. Primary Wound Dressing: Wound #1 Left,Anterior Lower Leg: Medihoney gel Wound #2 Right,Dorsal Foot: Medihoney gel Wound #3 Right,Distal,Anterior Lower Leg: Medihoney gel Wound #4 Right,Medial Lower Leg: Medihoney gel Wound #5 Right T Second: oe Medihoney gel Secondary Dressing: Wound #1 Left,Anterior Lower Leg: Foam Border - or gauze and tape Wound #2 Right,Dorsal Foot: Foam Border - or gauze and tape Wound #3 Right,Distal,Anterior Lower Leg: Foam Border - or gauze and tape Wound #4 Right,Medial Lower Leg: Foam Border - or gauze and tape Wound #5 Right T Second: oe Dry Gauze - or bandaid Home Health: Continue Home Health skilled nursing for wound care. [facility] Sp[facility] (Fax: [phone]) Phone: [phone] ordered were: Interventional Radiology [facility]) - Abnormal arterial study, non palpable pulses, necrotic ulcers on bilateral lower extremities #1 continue with Medihoney as the primary dressing. She could not afford Santyl 2. After considerable discussion with the patient we have elected to go ahead and refer her to interventional radiology at [facility]. I wonder whether Dr. Fredia Sorrow is still practicing there and perhaps reads studies from peripheral hospital although [facility] reads as [facility] 3. There is no doubt in my mind that she is going to require an angiogram of some form. She has transportation difficulties but I explained to her there is no option but to move forward here. Clearly her flow is worse now than it was in 2018 4. I did talk to her about smoking emphasizing that we are now talking about limb salvage Electronic Signature(s) Signed: 01/22/2020 5:13:24 PM By: Baltazar Najjar MD Entered By: Baltazar Najjar on 01/22/2020 12:25:33 -------------------------------------------------------------------------------- SuperBill Details Patient Name: Date of Service: ISHIKA, [place] 01/22/2020 Medical Record Number: [id number] Patient Account Number: [id number] Date of Birth/Sex: Treating RN: Oct 15, 1950 (69 y.o. 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 E11.42 Type 2 diabetes mellitus with diabetic polyneuropathy Facility Procedures CPT4 Code: [id number] Description: 97597 - DEBRIDE WOUND 1ST 20 SQ CM OR < ICD-10 Diagnosis Description L97.212 Non-pressure chronic ulcer of right calf with fat layer exposed Modifier: Quantity: 1 Physician Procedures : CPT4 Code Description Modifier [id number] - WC PHYS DEBR WO ANESTH 20 SQ CM ICD-10 Diagnosis Description L97.212 Non-pressure chronic ulcer of right calf with fat layer exposed Quantity: 1 Electronic Signature(s) Signed: 01/22/2020 5:13:24 PM By: Baltazar Najjar MD Entered By: Baltazar Najjar on 01/22/2020 12:25:47

## 2020-01-23 NOTE — Progress Notes (Signed)
Patricia [name], Patricia [name] ([id number]) Visit Report for 01/22/2020 Arrival Information Details Patient Name: Date of Service: Patricia [name], Patricia [name] 01/22/2020 11:00 A M Medical Record Number: [id number] Patient Account Number: [id number] Date of Birth/Sex: Treating RN: 12/23/1950 (69 y.o. Wynelle Link Primary Care Carlisa Eble: Other Clinician: Referring Rayhan Groleau: Treating Karrina Lye/Extender: Valentino Saxon in Treatment: 3 Visit Information History Since Last Visit Added or deleted any medications: No Patient Arrived: Walker Any new allergies or adverse reactions: No Arrival Time: 11:22 Had a fall or experienced change in No Accompanied By: self activities of daily living that may affect Transfer Assistance: None risk of falls: Patient Identification Verified: Yes Signs or symptoms of abuse/neglect since last visito No Secondary Verification Process Completed: Yes Hospitalized since last visit: No Patient Has Alerts: Yes Implantable device outside of the clinic excluding No Patient Alerts: Patient on Blood Thinner cellular tissue based products placed in the center since last visit: Has Dressing in Place as Prescribed: Yes Pain Present Now: Yes Electronic Signature(s) Signed: 01/23/2020 9:18:57 AM By: Karl Ito Entered By: Karl Ito on 01/22/2020 11:22:33 -------------------------------------------------------------------------------- Encounter Discharge Information Details Patient Name: Date of Service: Patricia Brooms. 01/22/2020 11:00 A M Medical Record Number: [id number] Patient Account Number: [id number] Date of Birth/Sex: Treating RN: 06/30/1950 (69 y.o. Tommye Standard Primary Care Leshay Desaulniers: Other Clinician: Referring Jennifer Payes: Treating An Schnabel/Extender: Valentino Saxon in Treatment: 3 Encounter Discharge Information Items Post Procedure Vitals Discharge Condition: Stable Temperature (F): 98.1 Ambulatory Status: Walker Pulse (bpm): 67 Discharge  Destination: Home Respiratory Rate (breaths/min): 18 Transportation: Other Blood Pressure (mmHg): 120/53 Accompanied By: self Schedule Follow-up Appointment: Yes Clinical Summary of Care: Patient Declined Electronic Signature(s) Signed: 01/23/2020 2:45:56 PM By: Zenaida Deed RN, BSN Entered By: Zenaida Deed on 01/22/2020 12:32:04 -------------------------------------------------------------------------------- Lower Extremity Assessment Details Patient Name: Date of Service: Patricia [name], Patricia [name] 01/22/2020 11:00 A M Medical Record Number: [id number] Patient Account Number: [id number] Date of Birth/Sex: Treating RN: 1950/10/28 (69 y.o. Tommye Standard Primary Care Lavida Patch: Other Clinician: Referring Ireoluwa Grant: Treating Karthik Whittinghill/Extender: Valentino Saxon in Treatment: 3 Edema Assessment Assessed: Kyra Searles: No] [Right: No] Edema: [Left: Yes] [Right: Yes] Calf Left: Right: Point of Measurement: 38 cm From Medial Instep 36.8 cm 36 cm Ankle Left: Right: Point of Measurement: 12 cm From Medial Instep 21.9 cm 22 cm Vascular Assessment Pulses: Dorsalis Pedis Palpable: [Left:No] [Right:No] Electronic Signature(s) Signed: 01/23/2020 2:45:56 PM By: Zenaida Deed RN, BSN Entered By: Zenaida Deed on 01/22/2020 11:48:22 -------------------------------------------------------------------------------- Multi Wound Chart Details Patient Name: Date of Service: Patricia Brooms 01/22/2020 11:00 A M Medical Record Number: [id number] Patient Account Number: [id number] Date of Birth/Sex: Treating RN: 08/15/1950 (69 y.o. Wynelle Link Primary Care Dorien Mayotte: Other Clinician: Referring Shawnmichael Parenteau: Treating Halbert Jesson/Extender: Valentino Saxon in Treatment: 3 Vital Signs Height(in): 64 Pulse(bpm): 67 Weight(lbs): 172 Blood Pressure(mmHg): 120/53 Body Mass Index(BMI): 30 Temperature(F): 98.1 Respiratory Rate(breaths/min): 18 Photos: [1:No Photos Left, Anterior Lower Leg]  [2:No Photos Right, Dorsal Foot] [3:No Photos Right, Distal, Anterior Lower Leg] Wound Location: [1:Gradually Appeared] [2:Gradually Appeared] [3:Gradually Appeared] Wounding Event: [1:Venous Leg Ulcer] [2:Venous Leg Ulcer] [3:Venous Leg Ulcer] Primary Etiology: [1:Diabetic Wound/Ulcer of the Lower] [2:Diabetic Wound/Ulcer of the Lower] [3:Diabetic Wound/Ulcer of the Lower] Secondary Etiology: [1:Extremity Cataracts, Chronic Obstructive] [2:Extremity Cataracts, Chronic Obstructive] [3:Extremity Cataracts, Chronic Obstructive] Comorbid History: [1:Pulmonary Disease (COPD), Congestive Heart Failure, Hypertension, Peripheral Venous Disease, Type II Diabetes, Neuropathy 09/20/2019] [2:Pulmonary Disease (COPD), Congestive Heart Failure, Hypertension, Peripheral Venous Disease, Type  II Diabetes, Neuropathy 10/21/2019] [3:Pulmonary Disease (COPD), Congestive Heart Failure, Hypertension,  Peripheral Venous Disease, Type II Diabetes, Neuropathy 10/21/2019] Date A cquired: [1:3] [2:3] [3:3] Weeks of Treatment: [1:Open] [2:Open] [3:Open] Wound Status: [1:0.7x0.5x0.1] [2:0.6x0.7x0.2] [3:0.3x0.6x0.1] Measurements L x W x D (cm) [1:0.275] [2:0.33] [3:0.141] A (cm) : rea [1:0.027] [2:0.066] [3:0.014] Volume (cm) : [1:51.30%] [2:48.10%] [3:55.10%] % Reduction in A [1:rea: 76.10%] [2:48.00%] [3:77.80%] % Reduction in Volume: [1:Full Thickness Without Exposed] [2:Full Thickness Without Exposed] [3:Full Thickness Without Exposed] Classification: [1:Support Structures Small] [2:Support Structures Small] [3:Support Structures Small] Exudate A mount: [1:Serous] [2:Serous] [3:Serous] Exudate Type: [1:amber] [2:amber] [3:amber] Exudate Color: [1:Flat and Intact] [2:Flat and Intact] [3:Well defined, not attached] Wound Margin: [1:Large (67-100%)] [2:Medium (34-66%)] [3:None Present (0%)] Granulation A mount: [1:Pink] [2:Pink] [3:N/A] Granulation Quality: [1:None Present (0%)] [2:Medium (34-66%)] [3:Large  (67-100%)] Necrotic A mount: [1:Fat Layer (Subcutaneous Tissue): Yes Fat Layer (Subcutaneous Tissue): Yes Fat Layer (Subcutaneous Tissue): Yes] Exposed Structures: [1:Fascia: No Tendon: No Muscle: No Joint: No Bone: No Small (1-33%)] [2:Fascia: No Tendon: No Muscle: No Joint: No Bone: No Small (1-33%)] [3:Fascia: No Tendon: No Muscle: No Joint: No Bone: No None] Epithelialization: [1:N/A] [2:N/A] [3:N/A] Debridement: [1:N/A] [2:N/A] [3:N/A] Tissue Debrided: [1:N/A] [2:N/A] [3:N/A] Level: [1:N/A] [2:N/A] [3:N/A] Debridement A (sq cm): [1:rea N/A] [2:N/A] [3:N/A] Instrument: [1:N/A] [2:N/A] [3:N/A] Bleeding: [1:N/A] [2:N/A] [3:N/A] Hemostasis A chieved: [1:N/A] [2:N/A] [3:N/A] Procedural Pain: [1:N/A] [2:N/A] [3:N/A] Post Procedural Pain: Debridement Treatment Response: N/A [2:N/A] [3:N/A] Post Debridement Measurements L x N/A [2:N/A] [3:N/A] W x D (cm) [1:N/A] [2:N/A] [3:N/A] Post Debridement Volume: (cm) [1:N/A] [2:N/A] [3:N/A] Wound Number: 4 5 N/A Photos: No Photos No Photos N/A Right, Medial Lower Leg Right T Second oe N/A Wound Location: Gradually Appeared Gradually Appeared N/A Wounding Event: Venous Leg Ulcer Diabetic Wound/Ulcer of the Lower N/A Primary Etiology: Extremity Diabetic Wound/Ulcer of the Lower N/A N/A Secondary Etiology: Extremity Cataracts, Chronic Obstructive Cataracts, Chronic Obstructive N/A Comorbid History: Pulmonary Disease (COPD), Pulmonary Disease (COPD), Congestive Heart Failure, Congestive Heart Failure, Hypertension, Peripheral Venous Hypertension, Peripheral Venous Disease, Type II Diabetes, Disease, Type II Diabetes, Neuropathy Neuropathy 01/21/2019 01/05/2020 N/A Date Acquired: 3 2 N/A Weeks of Treatment: Open Open N/A Wound Status: 2.7x2.2x0.3 0.3x0.3x0.1 N/A Measurements L x W x D (cm) 4.665 0.071 N/A A (cm) : rea 1.4 0.007 N/A Volume (cm) : -30.30% 74.90% N/A % Reduction in A rea: 56.60% 75.00% N/A % Reduction in  Volume: Full Thickness Without Exposed Grade 2 N/A Classification: Support Structures Medium Small N/A Exudate A mount: Serous Serous N/A Exudate Type: amber amber N/A Exudate Color: Well defined, not attached Flat and Intact N/A Wound Margin: None Present (0%) None Present (0%) N/A Granulation A mount: N/A N/A N/A Granulation Quality: Large (67-100%) Large (67-100%) N/A Necrotic A mount: Fat Layer (Subcutaneous Tissue): Yes Fat Layer (Subcutaneous Tissue): Yes N/A Exposed Structures: Fascia: No Fascia: No Tendon: No Tendon: No Muscle: No Muscle: No Joint: No Joint: No Bone: No Bone: No None None N/A Epithelialization: Debridement - Selective/Open Wound N/A N/A Debridement: Pre-procedure Verification/Time Out 11:58 N/A N/A Taken: Slough N/A N/A Tissue Debrided: Non-Viable Tissue N/A N/A Level: 5.94 N/A N/A Debridement A (sq cm): rea Curette N/A N/A Instrument: Minimum N/A N/A Bleeding: Pressure N/A N/A Hemostasis Achieved: 0 N/A N/A Procedural Pain: 0 N/A N/A Post Procedural Pain: Procedure was tolerated well N/A N/A Debridement Treatment Response: 2.7x2.2x0.3 N/A N/A Post Debridement Measurements L x W x D (cm) 1.4 N/A N/A Post Debridement Volume: (cm) Debridement N/A N/A Procedures Performed: Treatment Notes Electronic Signature(s) Signed: 01/22/2020 5:08:15 PM By: Zandra Abts RN,  BSN Signed: 01/22/2020 5:13:24 PM By: Baltazar Najjar MD Entered By: Baltazar Najjar on 01/22/2020 12:18:07 -------------------------------------------------------------------------------- Multi-Disciplinary Care Plan Details Patient Name: Date of Service: Patricia [name], Patricia [name] 01/22/2020 11:00 A M Medical Record Number: [id number] Patient Account Number: [id number] Date of Birth/Sex: Treating RN: 09-11-1950 (69 y.o. Wynelle Link Primary Care Willma Obando: Other Clinician: Referring Timothee Gali: Treating Hiedi Touchton/Extender: Valentino Saxon in Treatment: 3 Active  Inactive Abuse / Safety / Falls / Self Care Management Nursing Diagnoses: Potential for falls Potential for injury related to falls Goals: Patient will not experience any injury related to falls Date Initiated: 01/01/2020 Target Resolution Date: 01/30/2020 Goal Status: Active Patient/caregiver will verbalize/demonstrate measures taken to prevent injury and/or falls Date Initiated: 01/01/2020 Target Resolution Date: 01/30/2020 Goal Status: Active Interventions: Assess Activities of Daily Living upon admission and as needed Assess fall risk on admission and as needed Assess: immobility, friction, shearing, incontinence upon admission and as needed Assess impairment of mobility on admission and as needed per policy Assess personal safety and home safety (as indicated) on admission and as needed Assess self care needs on admission and as needed Provide education on fall prevention Provide education on personal and home safety Notes: Nutrition Nursing Diagnoses: Impaired glucose control: actual or potential Potential for alteratiion in Nutrition/Potential for imbalanced nutrition Goals: Patient/caregiver agrees to and verbalizes understanding of need to use nutritional supplements and/or vitamins as prescribed Date Initiated: 01/01/2020 Target Resolution Date: 01/30/2020 Goal Status: Active Patient/caregiver will maintain therapeutic glucose control Date Initiated: 01/01/2020 Target Resolution Date: 01/30/2020 Goal Status: Active Interventions: Assess HgA1c results as ordered upon admission and as needed Assess patient nutrition upon admission and as needed per policy Provide education on elevated blood sugars and impact on wound healing Provide education on nutrition Treatment Activities: Education provided on Nutrition : 01/01/2020 Notes: Venous Leg Ulcer Nursing Diagnoses: Knowledge deficit related to disease process and management Potential for venous Insuffiency (use before  diagnosis confirmed) Goals: Patient/caregiver will verbalize understanding of disease process and disease management Date Initiated: 01/01/2020 Target Resolution Date: 01/30/2020 Goal Status: Active Interventions: Assess peripheral edema status every visit. Compression as ordered Provide education on venous insufficiency Notes: Wound/Skin Impairment Nursing Diagnoses: Impaired tissue integrity Knowledge deficit related to smoking impact on wound healing Knowledge deficit related to ulceration/compromised skin integrity Goals: Patient will demonstrate a reduced rate of smoking or cessation of smoking Date Initiated: 01/01/2020 Target Resolution Date: 01/30/2020 Goal Status: Active Patient/caregiver will verbalize understanding of skin care regimen Date Initiated: 01/01/2020 Target Resolution Date: 01/30/2020 Goal Status: Active Interventions: Assess patient/caregiver ability to obtain necessary supplies Assess patient/caregiver ability to perform ulcer/skin care regimen upon admission and as needed Assess ulceration(s) every visit Provide education on ulcer and skin care Notes: Electronic Signature(s) Signed: 01/22/2020 5:08:15 PM By: Zandra Abts RN, BSN Entered By: Zandra Abts on 01/22/2020 13:16:25 -------------------------------------------------------------------------------- Pain Assessment Details Patient Name: Date of Service: Patricia [name], Patricia [name] 01/22/2020 11:00 A M Medical Record Number: [id number] Patient Account Number: [id number] Date of Birth/Sex: Treating RN: 19-May-1951 (69 y.o. Wynelle Link Primary Care Chauncy Mangiaracina: Other Clinician: Referring Marypat Kimmet: Treating Tahira Olivarez/Extender: Valentino Saxon in Treatment: 3 Active Problems Location of Pain Severity and Description of Pain Patient Has Paino Yes Site Locations Pain Location: Pain in Ulcers With Dressing Change: Yes Duration of the Pain. Constant / Intermittento Constant Rate the pain. Current  Pain Level: 7 Worst Pain Level: 10 Least Pain Level: 3 Character of Pain Describe the Pain: Sharp, Shooting, Throbbing Pain Management and Medication Current Pain Management: Medication:  Yes Is the Current Pain Management Adequate: Adequate Rest: Yes How does your wound impact your activities of daily livingo Sleep: Yes Bathing: No Appetite: No Relationship With Others: No Bladder Continence: No Emotions: Yes Bowel Continence: No Hobbies: No Toileting: No Dressing: No Electronic Signature(s) Signed: 01/22/2020 5:08:15 PM By: Zandra Abts RN, BSN Signed: 01/23/2020 2:45:56 PM By: Zenaida Deed RN, BSN Entered By: Zenaida Deed on 01/22/2020 11:45:00 -------------------------------------------------------------------------------- Patient/Caregiver Education Details Patient Name: Date of Service: Patricia Brooms 9/2/2021andnbsp11:00 A M Medical Record Number: [id number] Patient Account Number: [id number] Date of Birth/Gender: Treating RN: [birth year] (69 y.o. Wynelle Link Primary Care Physician: Other Clinician: Referring Physician: Treating Physician/Extender: Valentino Saxon in Treatment: 3 Education Assessment Education Provided To: Patient Education Topics Provided Smoking and Wound Healing: Methods: Explain/Verbal Responses: State content correctly Wound/Skin Impairment: Methods: Explain/Verbal Responses: State content correctly Electronic Signature(s) Signed: 01/22/2020 5:08:15 PM By: Zandra Abts RN, BSN Entered By: Zandra Abts on 01/22/2020 13:16:44 -------------------------------------------------------------------------------- Wound Assessment Details Patient Name: Date of Service: Patricia [name], Patricia [name] 01/22/2020 11:00 A M Medical Record Number: [id number] Patient Account Number: [id number] Date of Birth/Sex: Treating RN: 1951/02/05 (69 y.o. Wynelle Link Primary Care Keyonna Comunale: Other Clinician: Referring Amyrah Pinkhasov: Treating  Adan Beal/Extender: Valentino Saxon in Treatment: 3 Wound Status Wound Number: 1 Primary Venous Leg Ulcer Etiology: Wound Location: Left, Anterior Lower Leg Secondary Diabetic Wound/Ulcer of the Lower Extremity Wounding Event: Gradually Appeared Etiology: Date Acquired: 09/20/2019 Wound Open Weeks Of Treatment: 3 Status: Clustered Wound: No Comorbid Cataracts, Chronic Obstructive Pulmonary Disease (COPD), History: Congestive Heart Failure, Hypertension, Peripheral Venous Disease, Type II Diabetes, Neuropathy Wound Measurements Length: (cm) 0.7 Width: (cm) 0.5 Depth: (cm) 0.1 Area: (cm) 0.275 Volume: (cm) 0.027 % Reduction in Area: 51.3% % Reduction in Volume: 76.1% Epithelialization: Small (1-33%) Tunneling: No Undermining: No Wound Description Classification: Full Thickness Without Exposed Support Structures Wound Margin: Flat and Intact Exudate Amount: Small Exudate Type: Serous Exudate Color: amber Foul Odor After Cleansing: No Slough/Fibrino Yes Wound Bed Granulation Amount: Large (67-100%) Exposed Structure Granulation Quality: Pink Fascia Exposed: No Necrotic Amount: None Present (0%) Fat Layer (Subcutaneous Tissue) Exposed: Yes Tendon Exposed: No Muscle Exposed: No Joint Exposed: No Bone Exposed: No Treatment Notes Wound #1 (Left, Anterior Lower Leg) 3. Primary Dressing Applied Other primary dressing (specifiy in notes) 4. Secondary Dressing Foam Border Dressing Notes medihoney Electronic Signature(s) Signed: 01/22/2020 5:08:15 PM By: Zandra Abts RN, BSN Signed: 01/23/2020 2:45:56 PM By: Zenaida Deed RN, BSN Signed: 01/23/2020 2:45:56 PM By: Zenaida Deed RN, BSN Entered By: Zenaida Deed on 01/22/2020 11:49:24 -------------------------------------------------------------------------------- Wound Assessment Details Patient Name: Date of Service: Patricia [name], Patricia [name] 01/22/2020 11:00 A M Medical Record Number: [id number] Patient Account  Number: [id number] Date of Birth/Sex: Treating RN: July 06, 1950 (69 y.o. Wynelle Link Primary Care Danylah Holden: Other Clinician: Referring Mckay Brandt: Treating Edmond Ginsberg/Extender: Valentino Saxon in Treatment: 3 Wound Status Wound Number: 2 Primary Venous Leg Ulcer Etiology: Wound Location: Right, Dorsal Foot Secondary Diabetic Wound/Ulcer of the Lower Extremity Wounding Event: Gradually Appeared Etiology: Date Acquired: 10/21/2019 Wound Open Weeks Of Treatment: 3 Status: Clustered Wound: No Comorbid Cataracts, Chronic Obstructive Pulmonary Disease (COPD), History: Congestive Heart Failure, Hypertension, Peripheral Venous Disease, Type II Diabetes, Neuropathy Wound Measurements Length: (cm) 0.6 Width: (cm) 0.7 Depth: (cm) 0.2 Area: (cm) 0.33 Volume: (cm) 0.066 % Reduction in Area: 48.1% % Reduction in Volume: 48% Epithelialization: Small (1-33%) Tunneling: No Undermining: No Wound Description Classification: Full Thickness Without Exposed Support Structures Wound Margin: Flat and Intact Exudate Amount: Small  Exudate Type: Serous Exudate Color: amber Foul Odor After Cleansing: No Slough/Fibrino Yes Wound Bed Granulation Amount: Medium (34-66%) Exposed Structure Granulation Quality: Pink Fascia Exposed: No Necrotic Amount: Medium (34-66%) Fat Layer (Subcutaneous Tissue) Exposed: Yes Necrotic Quality: Adherent Slough Tendon Exposed: No Muscle Exposed: No Joint Exposed: No Bone Exposed: No Treatment Notes Wound #2 (Right, Dorsal Foot) 3. Primary Dressing Applied Other primary dressing (specifiy in notes) 4. Secondary Dressing Dry Gauze Roll Gauze Notes medihoney Electronic Signature(s) Signed: 01/22/2020 5:08:15 PM By: Zandra Abts RN, BSN Signed: 01/23/2020 2:45:56 PM By: Zenaida Deed RN, BSN Entered By: Zenaida Deed on 01/22/2020 11:49:51 -------------------------------------------------------------------------------- Wound Assessment  Details Patient Name: Date of Service: Patricia [name], Patricia [name] 01/22/2020 11:00 A M Medical Record Number: [id number] Patient Account Number: [id number] Date of Birth/Sex: Treating RN: 21-May-1951 (69 y.o. Wynelle Link Primary Care Gregary Blackard: Other Clinician: Referring Andoni Busch: Treating Keshawn Fiorito/Extender: Valentino Saxon in Treatment: 3 Wound Status Wound Number: 3 Primary Venous Leg Ulcer Etiology: Wound Location: Right, Distal, Anterior Lower Leg Secondary Diabetic Wound/Ulcer of the Lower Extremity Wounding Event: Gradually Appeared Etiology: Date Acquired: 10/21/2019 Wound Open Weeks Of Treatment: 3 Status: Clustered Wound: No Comorbid Cataracts, Chronic Obstructive Pulmonary Disease (COPD), History: Congestive Heart Failure, Hypertension, Peripheral Venous Disease, Type II Diabetes, Neuropathy Wound Measurements Length: (cm) 0.3 Width: (cm) 0.6 Depth: (cm) 0.1 Area: (cm) 0.141 Volume: (cm) 0.014 % Reduction in Area: 55.1% % Reduction in Volume: 77.8% Epithelialization: None Tunneling: No Undermining: No Wound Description Classification: Full Thickness Without Exposed Support Structures Wound Margin: Well defined, not attached Exudate Amount: Small Exudate Type: Serous Exudate Color: amber Foul Odor After Cleansing: No Slough/Fibrino Yes Wound Bed Granulation Amount: None Present (0%) Exposed Structure Necrotic Amount: Large (67-100%) Fascia Exposed: No Necrotic Quality: Adherent Slough Fat Layer (Subcutaneous Tissue) Exposed: Yes Tendon Exposed: No Muscle Exposed: No Joint Exposed: No Bone Exposed: No Treatment Notes Wound #3 (Right, Distal, Anterior Lower Leg) 3. Primary Dressing Applied Other primary dressing (specifiy in notes) 4. Secondary Dressing Foam Border Dressing Notes medihoney Electronic Signature(s) Signed: 01/22/2020 5:08:15 PM By: Zandra Abts RN, BSN Signed: 01/23/2020 2:45:56 PM By: Zenaida Deed RN, BSN Entered By: Zenaida Deed on 01/22/2020 11:50:19 -------------------------------------------------------------------------------- Wound Assessment Details Patient Name: Date of Service: Patricia [name], Patricia [name] 01/22/2020 11:00 A M Medical Record Number: [id number] Patient Account Number: [id number] Date of Birth/Sex: Treating RN: 1951/01/11 (69 y.o. Wynelle Link Primary Care Jaysha Lasure: Other Clinician: Referring Jazmen Lindenbaum: Treating Riely Baskett/Extender: Valentino Saxon in Treatment: 3 Wound Status Wound Number: 4 Primary Venous Leg Ulcer Etiology: Wound Location: Right, Medial Lower Leg Secondary Diabetic Wound/Ulcer of the Lower Extremity Wounding Event: Gradually Appeared Etiology: Date Acquired: 01/21/2019 Wound Open Weeks Of Treatment: 3 Status: Clustered Wound: No Comorbid Cataracts, Chronic Obstructive Pulmonary Disease (COPD), History: Congestive Heart Failure, Hypertension, Peripheral Venous Disease, Type II Diabetes, Neuropathy Wound Measurements Length: (cm) 2.7 Width: (cm) 2.2 Depth: (cm) 0.3 Area: (cm) 4.665 Volume: (cm) 1.4 % Reduction in Area: -30.3% % Reduction in Volume: 56.6% Epithelialization: None Tunneling: No Undermining: No Wound Description Classification: Full Thickness Without Exposed Support Structures Wound Margin: Well defined, not attached Exudate Amount: Medium Exudate Type: Serous Exudate Color: amber Foul Odor After Cleansing: No Slough/Fibrino Yes Wound Bed Granulation Amount: None Present (0%) Exposed Structure Necrotic Amount: Large (67-100%) Fascia Exposed: No Necrotic Quality: Adherent Slough Fat Layer (Subcutaneous Tissue) Exposed: Yes Tendon Exposed: No Muscle Exposed: No Joint Exposed: No Bone Exposed: No Treatment Notes Wound #4 (Right, Medial Lower Leg) 3. Primary Dressing Applied Other  primary dressing (specifiy in notes) 4. Secondary Dressing Foam Border Dressing Notes medihoney Electronic Signature(s) Signed: 01/22/2020 5:08:15 PM  By: Zandra Abts RN, BSN Signed: 01/23/2020 2:45:56 PM By: Zenaida Deed RN, BSN Entered By: Zenaida Deed on 01/22/2020 11:50:39 -------------------------------------------------------------------------------- Wound Assessment Details Patient Name: Date of Service: Patricia [name], Patricia [name] 01/22/2020 11:00 A M Medical Record Number: [id number] Patient Account Number: [id number] Date of Birth/Sex: Treating RN: 1951/04/30 (69 y.o. Wynelle Link Primary Care Mitzi Lilja: Other Clinician: Referring Noemi Bellissimo: Treating Velia Pamer/Extender: Valentino Saxon in Treatment: 3 Wound Status Wound Number: 5 Primary Diabetic Wound/Ulcer of the Lower Extremity Etiology: Wound Location: Right T Second oe Wound Open Wounding Event: Gradually Appeared Status: Date Acquired: 01/05/2020 Comorbid Cataracts, Chronic Obstructive Pulmonary Disease (COPD), Weeks Of Treatment: 2 History: Congestive Heart Failure, Hypertension, Peripheral Venous Clustered Wound: No Disease, Type II Diabetes, Neuropathy Wound Measurements Length: (cm) 0.3 Width: (cm) 0.3 Depth: (cm) 0.1 Area: (cm) 0.071 Volume: (cm) 0.007 % Reduction in Area: 74.9% % Reduction in Volume: 75% Epithelialization: None Tunneling: No Undermining: No Wound Description Classification: Grade 2 Wound Margin: Flat and Intact Exudate Amount: Small Exudate Type: Serous Exudate Color: amber Foul Odor After Cleansing: No Slough/Fibrino Yes Wound Bed Granulation Amount: None Present (0%) Exposed Structure Necrotic Amount: Large (67-100%) Fascia Exposed: No Necrotic Quality: Adherent Slough Fat Layer (Subcutaneous Tissue) Exposed: Yes Tendon Exposed: No Muscle Exposed: No Joint Exposed: No Bone Exposed: No Treatment Notes Wound #5 (Right Toe Second) 3. Primary Dressing Applied Other primary dressing (specifiy in notes) 4. Secondary Dressing Dry Gauze Roll Gauze Notes medihoney Electronic Signature(s) Signed: 01/22/2020 5:08:15  PM By: Zandra Abts RN, BSN Signed: 01/23/2020 2:45:56 PM By: Zenaida Deed RN, BSN Entered By: Zenaida Deed on 01/22/2020 11:51:06 -------------------------------------------------------------------------------- Vitals Details Patient Name: Date of Service: Patricia [name], Patricia [name] 01/22/2020 11:00 A M Medical Record Number: [id number] Patient Account Number: [id number] Date of Birth/Sex: Treating RN: 18-Aug-1950 (69 y.o. Wynelle Link Primary Care Sigmund Morera: Other Clinician: Referring Brandace Cargle: Treating Melodi Happel/Extender: Valentino Saxon in Treatment: 3 Vital Signs Time Taken: 11:22 Temperature (F): 98.1 Height (in): 64 Pulse (bpm): 67 Weight (lbs): 172 Respiratory Rate (breaths/min): 18 Body Mass Index (BMI): 29.5 Blood Pressure (mmHg): 120/53 Reference Range: 80 - 120 mg / dl Electronic Signature(s) Signed: 01/23/2020 9:18:57 AM By: Karl Ito Entered By: Karl Ito on 01/22/2020 11:25:02

## 2020-02-05 ENCOUNTER — Encounter (HOSPITAL_BASED_OUTPATIENT_CLINIC_OR_DEPARTMENT_OTHER): Payer: Medicare Other | Admitting: Internal Medicine

## 2020-02-06 ENCOUNTER — Inpatient Hospital Stay (HOSPITAL_COMMUNITY): Admission: RE | Admit: 2020-02-06 | Payer: Medicare Other | Source: Ambulatory Visit

## 2020-02-10 ENCOUNTER — Other Ambulatory Visit: Payer: Self-pay | Admitting: Internal Medicine

## 2020-02-10 DIAGNOSIS — L97919 Non-pressure chronic ulcer of unspecified part of right lower leg with unspecified severity: Secondary | ICD-10-CM

## 2020-02-12 ENCOUNTER — Encounter (HOSPITAL_BASED_OUTPATIENT_CLINIC_OR_DEPARTMENT_OTHER): Payer: Medicare Other | Admitting: Internal Medicine

## 2020-02-16 ENCOUNTER — Encounter (HOSPITAL_BASED_OUTPATIENT_CLINIC_OR_DEPARTMENT_OTHER): Payer: Medicare Other | Admitting: Internal Medicine

## 2020-02-24 ENCOUNTER — Encounter: Payer: Self-pay | Admitting: *Deleted

## 2020-02-24 ENCOUNTER — Ambulatory Visit
Admission: RE | Admit: 2020-02-24 | Discharge: 2020-02-24 | Disposition: A | Discharge status: Home / Self Care | Payer: Medicare Other | Source: Ambulatory Visit | Attending: Internal Medicine | Admitting: Internal Medicine

## 2020-02-24 DIAGNOSIS — L97919 Non-pressure chronic ulcer of unspecified part of right lower leg with unspecified severity: Secondary | ICD-10-CM

## 2020-02-24 HISTORY — PX: IR RADIOLOGIST EVAL & MGMT: IMG5224

## 2020-02-24 NOTE — Consult Note (Addendum)
Chief Complaint: Right leg wound  Referring Physician(s): Robson,Michael G  History of Present Illness: Patricia [name] is a very energetic 69 y.o. female presenting as a scheduled consultation to [facility] clinic, kindly referred by Dr Leanord Hawking [facility], for evaluation of arterial insufficiency, right leg wound and candidacy for revascularization.    Patricia [name] is here today by herself for her appointment.    Patricia [name] reports that last August she had a "red ant" bite her right posterior medial calf, and that she got a wound on the skin.  She tells me her PCP told her that she might not heal very well given her history of diabetes, however, the wound has progressed significantly and she is very concerned.    Recently she was referred [facility] the [facility] for advanced care -- just 2-3 weeks ago she tells me.    This is the first wound that has not healed of either leg.  She is not ambulating much at home, she attributes this to severe back pain.  I cannot elicit a recent or remote history of claudication.    She reports local pain at the wound site.  She says she has pain in the calf and foot when the leg is both dependent and non-dependent.    She denies any history of MI or stroke. She denies any resting chest pain or SOB.   She has smoked since college, at least 1ppd, and continues to smoke, reported to be 1/2pack daily.  She has tried to quit.    Cardiovascular risk factors include: Smoking, diabetes, HTN, HLD.  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  She currently lives alone. She has 1 son in [place], and 1 daughter living locally.  She continues to smoke.     Noninvasive exam 01/19/20, ABI and duplex: Right:  ABI was non compressible Duplex shows SFA occlusion  Left: ABI is 0.6 Duplex shows SFA occlusion  Previous noninvasive exam was 01/11/17, with ABI and  duplex at [facility]. Right: ABI 0.7 Duplex shows distal SFA stenosis  Left: ABI 0.5 Duplex shows mid SFA stenosis  Cardiologist: None.  She reports no heart history PCP: Dr. Luz Brazen, Physician in [place], [place].  Podiatrist: None   No past medical history on file.    Allergies: She denies any medical allergies.   Medications: Prior to Admission medications   Not on File     No family history on file.  Social History   Socioeconomic History  . Marital status: Divorced    Spouse name: Not on file  . Number of children: Not on file  . Years of education: Not on file  . Highest education level: Not on file  Occupational History  . Not on file  Tobacco Use  . Smoking status: Not on file  Substance and Sexual Activity  . Alcohol use: Not on file  . Drug use: Not on file  . Sexual activity: Not on file  Other Topics Concern  . Not on file  Social History Narrative  . Not on file   Social Determinants of Health   Financial Resource Strain:   . Difficulty of Paying Living Expenses: Not on file  Food Insecurity:   . Worried About Programme researcher, broadcasting/film/video in the Last Year: Not on file  . Ran Out of Food in the Last Year: Not on file  Transportation Needs:   . Freight forwarder (Medical): Not on file  . Lack of Transportation (Non-Medical): Not on file  Physical Activity:   . Days of Exercise per Week: Not on file  . Minutes of Exercise per Session: Not on file  Stress:   . Feeling of Stress : Not on file  Social Connections:   . Frequency of Communication with Friends and Family: Not on file  . Frequency of Social Gatherings with Friends and Family: Not on file  . Attends Religious Services: Not on file  . Active Member of Clubs or Organizations: Not on file  . Attends Banker Meetings: Not on file  . Marital Status: Not on file       Review of Systems: A 12 point ROS discussed and pertinent positives are indicated in the HPI above.  All  other systems are negative.  Review of Systems  Vital Signs: There were no vitals taken for this visit.  Physical Exam General: 69 yo female appearing stated age.  Well-developed, well-nourished.  No distress. In a wheelchair HEENT: Atraumatic, normocephalic.  Glasses. Conjugate gaze, extra-ocular motor intact. No scleral icterus or scleral injection. No lesions on external ears, nose, lips, or gums.  Oral mucosa moist, pink.  Neck: Symmetric with no goiter enlargement.  Chest/Lungs:  Symmetric chest with inspiration/expiration.  No labored breathing.  Clear to auscultation with no wheezes, rhonchi, or rales.  Heart:  RRR, with no third heart sounds appreciated. No JVD appreciated.  Abdomen:  Soft, NT/ND, with + bowel sounds.   Genito-urinary: Deferred Neurologic: Alert & Oriented to person, place, and time.   Normal affect and insight.  Appropriate questions.  Moving all 4 extremities Pulse Exam:  No bruit appreciated.  Doppler positive right AT & PT.  Doppler positive left AT & PT.  Extremities:   Mid right calf, posterior medial, is ~4cm x 3cm wound, full thickness, with the proximal aspect with eschar.  There is drainage from the lower portion, with margin of erythema.     Additionally, small eschar on the dorsal second right toe, and at the dorsal foot at the 1st/2nd interspace.  No wounds on the left.  Hairless lower extremity.       Mallampati Score:  Imaging:  Non-invasive  Labs:  CBC: No results for input(s): WBC, HGB, HCT, PLT in the last 8760 hours.  COAGS: No results for input(s): INR, APTT in the last 8760 hours.  BMP: No results for input(s): NA, K, CL, CO2, GLUCOSE, BUN, CALCIUM, CREATININE, GFRNONAA, GFRAA in the last 8760 hours.  Invalid input(s): CMP  LIVER FUNCTION TESTS: No results for input(s): BILITOT, AST, ALT, ALKPHOS, PROT, ALBUMIN in the last 8760 hours.  TUMOR MARKERS: No results for input(s): AFPTM, CEA, CA199, CHROMGRNA in the last 8760  hours.  Assessment and Plan:  Assessment:  Patricia [name] is a 69yo female presenting with Rutherford 5 symptoms of CLTI/CLI of the right lower extremity.      Non-invasive lower extremity exam and imaging work-up shows evidence of occlusive fem-pop arterial disease, bilaterally.    I had a discussion with Patricia Beem regarding anatomy, pathology/pathophysiology, natural history, and prognosis of PAD/CLI.  Informed consent regarding treatment strategies was performed which would possibly include medical management,  surgical strategy, and/or endovascular options, with risk/benefit discussion.  The indications for treatment supported by updated guidelines1, 2 were discussed.  Patient has elected to proceed with endovascular options.   Regarding endovascular options, specific risks discussed include: bleeding,  infection, contrast reaction, renal injury/nephropathy, arterial injury/dissection, need for additional procedure/surgery, worsening symptoms/tissue including limb loss, cardiopulmonary collapse, death.    Regarding medical management, maximal medical therapy for reduction of risk factors is indicated as recommended by updated AHA guidelines1.  This includes anti-platelet medication, tight blood glucose control to a HbA1c < 7, tight blood pressure control, maximum-dose HMG-CoA reductase inhibitor, and smoking cessation.  Smoking cessation was addressed, with strategies including counseling and nicotine replacement.  She does not seem very interested in committing to a program to quit at this time.   Annual flu vaccination is also recommended, with Class 1 recommendation1.   Plan: -Plan is to proceed with aorto-peripheral angiogram and possible intervention. - Continue maximal medical therapy for cardiovascular risk reduction, including anti-platelet therapy. -Recommend initiating smoking cessation measures, with options discussed.     ___________________________________________________________________   1Monte Fantasia MD, et al. 2016 AHA/ACC Guideline on the Management of Patients With Lower Extremity Peripheral Artery Disease: Executive Summary: A Report of the American College of Cardiology/American Heart Association Task Force on Clinical Practice Guidelines. J Am Coll Cardiol. 2017 Mar 21;69(11):1465-1508. doi: 10.1016/j.jacc.2016.11.008.   2 - Norgren L, et al. TASC II Working Group. Inter-society consensus for the management of peripheral arterial disease. Int Nunzio Cobbs. 2007 Jun;26(2):81-157. Review. PubMed PMID: 65681275  3 - Hingorani A, et al. The management of diabetic foot: A clinical practice guideline by the Society for Vascular Surgery in collaboration with the American Podiatric Medical Association and the Society  for Vascular Medicine. J Vasc Surg. 2016 Feb;63(2 Suppl):3S-21S. doi: 10.1016/j.jvs.2015.10.003. PubMed PMID: 17001749.  4 - Luther Hearing, Saab FA, Elyse Jarvis, Danae Orleans, Deeann Cree, Driver VR, Floral, Lookstein R, van den Tilman Neat, Jaff MR, Reinaldo Raddle, Henao S, AlMahameed A, Katzen B. Digital Subtraction Angiography Prior to an Amputation for Critical Limb Ischemia (CLI): An Expert Recommendation Statement From the CLI Global Society to Optimize Limb Salvage. J Endovasc Ther. 2020 Aug;27(4):540-546. doi: 10.1177/1526602820928590. Epub 2020 May 29. PMID: 44967591.    Thank you for this interesting consult.  I greatly enjoyed meeting SPENCER SCHIFANO and look forward to participating in their care.  A copy of this report was sent to the requesting provider on this date.  Electronically Signed: Gilmer Mor 02/24/2020, 1:57 PM   I spent a total of  60 Minutes   in face to face in clinical consultation, greater than 50% of which was counseling/coordinating care for right lower extremity CLI/CLTI, Rutherford 5 disease, possible angiogram & intervention.

## 2020-02-25 ENCOUNTER — Other Ambulatory Visit: Payer: Self-pay | Admitting: Interventional Radiology

## 2020-02-26 ENCOUNTER — Encounter (HOSPITAL_BASED_OUTPATIENT_CLINIC_OR_DEPARTMENT_OTHER): Payer: Medicare Other | Admitting: Internal Medicine

## 2020-03-01 ENCOUNTER — Other Ambulatory Visit (HOSPITAL_COMMUNITY): Payer: Self-pay | Admitting: Interventional Radiology

## 2020-03-01 DIAGNOSIS — L97919 Non-pressure chronic ulcer of unspecified part of right lower leg with unspecified severity: Secondary | ICD-10-CM

## 2020-03-05 ENCOUNTER — Other Ambulatory Visit: Payer: Self-pay | Admitting: Physician Assistant

## 2020-03-08 ENCOUNTER — Other Ambulatory Visit (HOSPITAL_COMMUNITY): Payer: Self-pay | Admitting: Interventional Radiology

## 2020-03-08 ENCOUNTER — Encounter (HOSPITAL_COMMUNITY): Payer: Self-pay

## 2020-03-08 ENCOUNTER — Other Ambulatory Visit: Payer: Self-pay

## 2020-03-08 ENCOUNTER — Ambulatory Visit (HOSPITAL_COMMUNITY)
Admission: RE | Admit: 2020-03-08 | Discharge: 2020-03-08 | Disposition: A | Discharge status: Home / Self Care | Payer: Medicare Other | Source: Ambulatory Visit | Attending: Interventional Radiology | Admitting: Interventional Radiology

## 2020-03-08 DIAGNOSIS — L97519 Non-pressure chronic ulcer of other part of right foot with unspecified severity: Secondary | ICD-10-CM | POA: Insufficient documentation

## 2020-03-08 DIAGNOSIS — E1151 Type 2 diabetes mellitus with diabetic peripheral angiopathy without gangrene: Secondary | ICD-10-CM | POA: Insufficient documentation

## 2020-03-08 DIAGNOSIS — E11621 Type 2 diabetes mellitus with foot ulcer: Secondary | ICD-10-CM | POA: Insufficient documentation

## 2020-03-08 DIAGNOSIS — Z7982 Long term (current) use of aspirin: Secondary | ICD-10-CM | POA: Diagnosis not present

## 2020-03-08 DIAGNOSIS — I70235 Atherosclerosis of native arteries of right leg with ulceration of other part of foot: Secondary | ICD-10-CM | POA: Diagnosis not present

## 2020-03-08 DIAGNOSIS — L97919 Non-pressure chronic ulcer of unspecified part of right lower leg with unspecified severity: Secondary | ICD-10-CM

## 2020-03-08 DIAGNOSIS — Z79899 Other long term (current) drug therapy: Secondary | ICD-10-CM | POA: Insufficient documentation

## 2020-03-08 DIAGNOSIS — Z7984 Long term (current) use of oral hypoglycemic drugs: Secondary | ICD-10-CM | POA: Insufficient documentation

## 2020-03-08 DIAGNOSIS — L97219 Non-pressure chronic ulcer of right calf with unspecified severity: Secondary | ICD-10-CM | POA: Insufficient documentation

## 2020-03-08 DIAGNOSIS — F17210 Nicotine dependence, cigarettes, uncomplicated: Secondary | ICD-10-CM | POA: Diagnosis not present

## 2020-03-08 DIAGNOSIS — I10 Essential (primary) hypertension: Secondary | ICD-10-CM | POA: Diagnosis not present

## 2020-03-08 HISTORY — PX: IR ANGIOGRAM EXTREMITY RIGHT: IMG652

## 2020-03-08 HISTORY — PX: IR FEM POP ART PTA MOD SED: IMG2309

## 2020-03-08 HISTORY — PX: IR US GUIDE VASC ACCESS LEFT: IMG2389

## 2020-03-08 LAB — BASIC METABOLIC PANEL
Anion gap: 10 (ref 5–15)
BUN: 30 mg/dL — ABNORMAL HIGH (ref 8–23)
CO2: 25 mmol/L (ref 22–32)
Calcium: 9.9 mg/dL (ref 8.9–10.3)
Chloride: 103 mmol/L (ref 98–111)
Creatinine, Ser: 1.28 mg/dL — ABNORMAL HIGH (ref 0.44–1.00)
GFR, Estimated: 43 mL/min — ABNORMAL LOW (ref >60)
Glucose, Bld: 165 mg/dL — ABNORMAL HIGH (ref 70–99)
Potassium: 4.9 mmol/L (ref 3.5–5.1)
Sodium: 138 mmol/L (ref 135–145)

## 2020-03-08 LAB — CBC
HCT: 40.7 % (ref 36.0–46.0)
Hemoglobin: 12.4 g/dL (ref 12.0–15.0)
MCH: 27 pg (ref 26.0–34.0)
MCHC: 30.5 g/dL (ref 30.0–36.0)
MCV: 88.5 fL (ref 80.0–100.0)
Platelets: 306 10*3/uL (ref 150–400)
RBC: 4.6 MIL/uL (ref 3.87–5.11)
RDW: 15.1 % (ref 11.5–15.5)
WBC: 7.3 10*3/uL (ref 4.0–10.5)
nRBC: 0 % (ref 0.0–0.2)

## 2020-03-08 LAB — PROTIME-INR
INR: 0.9 (ref 0.8–1.2)
Prothrombin Time: 12 seconds (ref 11.4–15.2)

## 2020-03-08 LAB — GLUCOSE, CAPILLARY: Glucose-Capillary: 145 mg/dL — ABNORMAL HIGH (ref 70–99)

## 2020-03-08 MED ORDER — HEPARIN SODIUM (PORCINE) 1000 UNIT/ML IJ SOLN
INTRAMUSCULAR | Status: AC
  Filled 2020-03-08: qty 1

## 2020-03-08 MED ORDER — ASCORBIC ACID 500 MG PO TABS
1000.0000 mg | ORAL_TABLET | Freq: Once | ORAL | Status: AC
  Administered 2020-03-08: 1000 mg via ORAL
  Filled 2020-03-08: qty 2

## 2020-03-08 MED ORDER — ASPIRIN 325 MG PO TABS
ORAL_TABLET | ORAL | Status: AC
  Filled 2020-03-08: qty 2

## 2020-03-08 MED ORDER — IODIXANOL 320 MG/ML IV SOLN
100.0000 mL | Freq: Once | INTRAVENOUS | Status: AC | PRN
  Administered 2020-03-08: 88 mL via INTRA_ARTERIAL

## 2020-03-08 MED ORDER — FENTANYL CITRATE (PF) 100 MCG/2ML IJ SOLN
INTRAMUSCULAR | Status: DC | PRN
  Administered 2020-03-08 (×2): 50 ug via INTRAVENOUS

## 2020-03-08 MED ORDER — MIDAZOLAM HCL 2 MG/2ML IJ SOLN
INTRAMUSCULAR | Status: AC
  Filled 2020-03-08: qty 2

## 2020-03-08 MED ORDER — CLOPIDOGREL BISULFATE 75 MG PO TABS: 300.0000 mg | ORAL_TABLET | Freq: Once | ORAL | Status: AC

## 2020-03-08 MED ORDER — HYDROMORPHONE HCL 1 MG/ML IJ SOLN
INTRAMUSCULAR | Status: DC | PRN
Start: 2020-03-08 — End: 2020-03-09
  Administered 2020-03-08 (×2): 0.5 mg via INTRAVENOUS

## 2020-03-08 MED ORDER — ASPIRIN 325 MG PO TABS
650.0000 mg | ORAL_TABLET | Freq: Once | ORAL | Status: AC
  Administered 2020-03-08: 650 mg via ORAL

## 2020-03-08 MED ORDER — FENTANYL CITRATE (PF) 100 MCG/2ML IJ SOLN
INTRAMUSCULAR | Status: AC
  Filled 2020-03-08: qty 2

## 2020-03-08 MED ORDER — HYDROMORPHONE HCL 1 MG/ML IJ SOLN
INTRAMUSCULAR | Status: AC
  Filled 2020-03-08: qty 1

## 2020-03-08 MED ORDER — LIDOCAINE HCL 1 % IJ SOLN
INTRAMUSCULAR | Status: AC
  Filled 2020-03-08: qty 20

## 2020-03-08 MED ORDER — CLOPIDOGREL BISULFATE 300 MG PO TABS
ORAL_TABLET | ORAL | Status: AC
  Administered 2020-03-08: 300 mg via ORAL
  Filled 2020-03-08: qty 1

## 2020-03-08 MED ORDER — SODIUM CHLORIDE 0.9 % IV SOLN: INTRAVENOUS | Status: DC

## 2020-03-08 MED ORDER — MIDAZOLAM HCL 2 MG/2ML IJ SOLN
INTRAMUSCULAR | Status: DC | PRN
  Administered 2020-03-08 (×2): 1 mg via INTRAVENOUS

## 2020-03-08 MED ORDER — HEPARIN SODIUM (PORCINE) 1000 UNIT/ML IJ SOLN
INTRAMUSCULAR | Status: DC | PRN
  Administered 2020-03-08: 9000 [IU] via INTRAVENOUS

## 2020-03-08 MED ORDER — LIDOCAINE HCL (PF) 1 % IJ SOLN
INTRAMUSCULAR | Status: DC | PRN
  Administered 2020-03-08: 10 mL

## 2020-03-08 MED ORDER — PROTAMINE SULFATE 10 MG/ML IV SOLN
50.0000 mg | INTRAVENOUS | Status: AC
  Administered 2020-03-08: 50 mg via INTRAVENOUS
  Filled 2020-03-08: qty 5

## 2020-03-08 NOTE — Progress Notes (Signed)
Paper script for Plavix 75 mg PO daily for 90 days placed with patient belonging in short stay.   Loyce Dys, MS RD PA-C 12:18 PM

## 2020-03-08 NOTE — H&P (Signed)
Chief Complaint: Patient was seen in consultation today for RLE necrotic ulcer  Supervising Physician: Gilmer Mor  Patient Status: [name] - Out-pt  History of Present Illness: Patricia [name] is a 69 y.o. female with past medical history of DM, HTN, current smoker who presents to [facility] today for RLE angiogram with possible intervention due to arterial insufficiency and chronic RLE wound. She was recently seen in VIR clinic with Dr. Loreta Ave to discuss treatment.  At that time she described an "ant bite" to the right posterior medial calf that did not heal and progressively worsened despite intervention through the [facility].   Patient presents to short stay today complaining of worsening RLE pain.  She states she now has shooting pain through her leg which is keeping her up at night.  She is still using a walker at home and attempting to ambulate but reports it is painful to walk.  She is not currently on antibiotics. Denies fever, chills.  She has been NPO.  She has been on aspirin 81mg .  She has reduced her smoking to 1 pack per day from 1.5-2 packs per day.    Allergies: Hydrocodone-acetaminophen  Medications: Prior to Admission medications   Medication Sig Start Date End Date Taking? Authorizing Provider  acetaminophen (TYLENOL) 650 MG CR tablet Take 1,300 mg by mouth 2 (two) times daily.   Yes [provider]  albuterol (PROVENTIL) (2.5 MG/3ML) 0.083% nebulizer solution Take 3 mLs by nebulization every 6 (six) hours as needed for shortness of breath. 11/25/19  Yes [provider]  albuterol (VENTOLIN HFA) 108 (90 Base) MCG/ACT inhaler Inhale 2 puffs into the lungs every 6 (six) hours as needed for shortness of breath. 10/28/19  Yes [provider]  amLODipine (NORVASC) 10 MG tablet Take 10 mg by mouth daily.   Yes [provider]  aspirin EC 81 MG tablet Take 81 mg by mouth daily. Swallow whole.   Yes [provider]    atorvastatin (LIPITOR) 80 MG tablet Take 80 mg by mouth daily.   Yes [provider]  diphenhydramine-acetaminophen (TYLENOL PM) 25-500 MG TABS tablet Take 2 tablets by mouth at bedtime as needed (sleep).   Yes [provider]  ferrous sulfate 325 (65 FE) MG tablet Take 325 mg by mouth daily.   Yes [provider]  furosemide (LASIX) 40 MG tablet Take 40 mg by mouth 2 (two) times daily.   Yes [provider]  gabapentin (NEURONTIN) 300 MG capsule Take 300-900 mg by mouth See admin instructions. Take 900 mg in the morning, 300 mg at noon, and 900 mg at night   Yes [provider]  glipiZIDE (GLUCOTROL XL) 10 MG 24 hr tablet Take 10 mg by mouth daily.   Yes [provider]  magnesium oxide (MAG-OX) 400 MG tablet Take 400 mg by mouth 2 (two) times daily.   Yes [provider]  meloxicam (MOBIC) 7.5 MG tablet Take 7.5 mg by mouth daily.   Yes [provider]  metFORMIN (GLUCOPHAGE) 1000 MG tablet Take 1,000 mg by mouth 2 (two) times daily.   Yes [provider]  metoprolol tartrate (LOPRESSOR) 100 MG tablet Take 100 mg by mouth 2 (two) times daily.   Yes [provider]  spironolactone (ALDACTONE) 25 MG tablet Take 25 mg by mouth daily.   Yes [provider]  venlafaxine XR (EFFEXOR-XR) 150 MG 24 hr capsule Take 150 mg by mouth daily with breakfast.  Yes [provider]     History reviewed. No pertinent family history.  Social History   Socioeconomic History  . Marital status: Divorced    Spouse name: Not on file  . Number of children: Not on file  . Years of education: Not on file  . Highest education level: Not on file  Occupational History  . Not on file  Tobacco Use  . Smoking status: Current Every Day Smoker    Packs/day: 1.50    Types: Cigarettes  Substance and Sexual Activity  . Alcohol use: Not on file  . Drug use: Not on file  . Sexual activity: Not on file  Other  Topics Concern  . Not on file  Social History Narrative  . Not on file   Social Determinants of Health   Financial Resource Strain:   . Difficulty of Paying Living Expenses: Not on file  Food Insecurity:   . Worried About Programme researcher, broadcasting/film/video in the Last Year: Not on file  . Ran Out of Food in the Last Year: Not on file  Transportation Needs:   . Lack of Transportation (Medical): Not on file  . Lack of Transportation (Non-Medical): Not on file  Physical Activity:   . Days of Exercise per Week: Not on file  . Minutes of Exercise per Session: Not on file  Stress:   . Feeling of Stress : Not on file  Social Connections:   . Frequency of Communication with Friends and Family: Not on file  . Frequency of Social Gatherings with Friends and Family: Not on file  . Attends Religious Services: Not on file  . Active Member of Clubs or Organizations: Not on file  . Attends Banker Meetings: Not on file  . Marital Status: Not on file     Review of Systems: A 12 point ROS discussed and pertinent positives are indicated in the HPI above.  All other systems are negative.  Review of Systems  Constitutional: Negative for fatigue and fever.  Respiratory: Negative for cough and shortness of breath.   Cardiovascular: Negative for chest pain.  Gastrointestinal: Negative for abdominal pain.  Musculoskeletal: Negative for back pain.  Skin: Positive for color change and wound (RLE wound to calf, 2nd great toe wound).  Psychiatric/Behavioral: Negative for behavioral problems and confusion.    Vital Signs: BP (!) 143/53   Pulse 70   Ht 5\' 4"  (1.626 m)   Wt 179 lb (81.2 kg)   SpO2 100%   BMI 30.73 kg/m   Physical Exam Vitals and nursing note reviewed.  Constitutional:      General: She is not in acute distress.    Appearance: Normal appearance. She is not ill-appearing.  HENT:     Mouth/Throat:     Mouth: Mucous membranes are moist.     Pharynx: Oropharynx is clear.    Cardiovascular:     Rate and Rhythm: Normal rate and regular rhythm.  Pulmonary:     Effort: Pulmonary effort is normal. No respiratory distress.     Breath sounds: Normal breath sounds.  Abdominal:     General: Abdomen is flat.     Palpations: Abdomen is soft.  Skin:    General: Skin is warm and dry.     Comments: RLE appears red, swollen,and taut from the ankle to the toes.  Her RLE calf wound appears overall stable.  Area of scabbing/necrosis superiorly with fresh, open wound posterior with yellow drainage.  Scabbed/necrotic wound to  the anterior 2nd toe.  Small area of discoloration (pallor) at the base on the 2nd toe.   Neurological:     General: No focal deficit present.     Mental Status: She is alert and oriented to person, place, and time. Mental status is at baseline.  Psychiatric:        Mood and Affect: Mood normal.        Behavior: Behavior normal.        Thought Content: Thought content normal.        Judgment: Judgment normal.           MD Evaluation Airway: WNL Heart: WNL Abdomen: WNL Chest/ Lungs: WNL ASA  Classification: 3 Mallampati/Airway Score: One   Imaging: IR Radiologist Eval & Mgmt  Result Date: 02/24/2020 Please refer to notes tab for details about interventional procedure. (Op Note)   Labs:  CBC: Recent Labs    03/08/20 0818  WBC 7.3  HGB 12.4  HCT 40.7  PLT 306    COAGS: Recent Labs    03/08/20 0818  INR 0.9    BMP: Recent Labs    03/08/20 0818  NA 138  K 4.9  CL 103  CO2 25  GLUCOSE 165*  BUN 30*  CALCIUM 9.9  CREATININE 1.28*  GFRNONAA 43*    LIVER FUNCTION TESTS: No results for input(s): BILITOT, AST, ALT, ALKPHOS, PROT, ALBUMIN in the last 8760 hours.  TUMOR MARKERS: No results for input(s): AFPTM, CEA, CA199, CHROMGRNA in the last 8760 hours.  Assessment and Plan: Patient with past medical history of DM, HTN  presents with complaint of worsening RLE wound.  IR consulted for angiogram and  intervention. Case reviewed by Dr. Loreta Ave who approves patient for procedure.  Patient presents today in their usual state of health.  She has been NPO and is not currently on blood thinners.  Her creatinine today is 1.28.   Risks and benefits were discussed with the patient including, but not limited to bleeding, infection, vascular injury or contrast induced renal failure.  This interventional procedure involves the use of X-rays and because of the nature of the planned procedure, it is possible that we will have prolonged use of X-ray fluoroscopy.  Potential radiation risks to you include (but are not limited to) the following: - A slightly elevated risk for cancer  several years later in life. This risk is typically less than 0.5% percent. This risk is low in comparison to the normal incidence of human cancer, which is 33% for women and 50% for men according to the American Cancer Society. - Radiation induced injury can include skin redness, resembling a rash, tissue breakdown / ulcers and hair loss (which can be temporary or permanent).   The likelihood of either of these occurring depends on the difficulty of the procedure and whether you are sensitive to radiation due to previous procedures, disease, or genetic conditions.   IF your procedure requires a prolonged use of radiation, you will be notified and given written instructions for further action.  It is your responsibility to monitor the irradiated area for the 2 weeks following the procedure and to notify your physician if you are concerned that you have suffered a radiation induced injury.    All of the patient's questions were answered, patient is agreeable to proceed.  Consent signed and in chart.  Thank you for this interesting consult.  I greatly enjoyed meeting KIMBLE KOSTIUK and look forward to participating in their care.  A copy of this report was sent to the requesting provider on this date.  Electronically  Signed: Hoyt Koch, PA 03/08/2020, 9:23 AM   I spent a total of 40 Minutes    in face to face in clinical consultation, greater than 50% of which was counseling/coordinating care for right lower extremity wound, nonhealing.

## 2020-03-08 NOTE — Sedation Documentation (Signed)
Patient having difficulty laying flat due to chronic back pain.  Warm pack place on patients back as well as multiple medications administered.  Patient is persistent in asking to move her legs but comforted by staff due to having both groins accessed.

## 2020-03-08 NOTE — Procedures (Signed)
Interventional Radiology Procedure Note  Procedure:   US guided access left CFA RLE angiogram Treatment of short segment right SFA CTO with PTA, then DEB 76m Angioseal for hemostasis.  .  Complications: None  Recommendations:  - Ok to shower tomorrow - 4 hours left leg straight - Routine wound care - Starting DAPT x 90 days with ASA & plavix - Follow up with Dr. WEarleen Newportin the clinic  - continue wound care with clinic - Do not submerge for 7 days - DC home in 4 hours when goals met - PRN ice / heat for lumbar pain for next 4 hours  Signed,  JDulcy Fanny WEarleen Newport DO

## 2020-03-08 NOTE — Progress Notes (Addendum)
Discharge instructions reviewed with pt daughter Danielle Dess.  Also informed to take script to pharmacy for Plavix. Voices understanding. Attempted to reviewed discharge instructions with pt. She arouses easily, but goes right back to sleep.

## 2020-03-08 NOTE — Discharge Instructions (Addendum)
Angiogram, Care After This sheet gives you information about how to care for yourself after your procedure. Your doctor may also give you more specific instructions. If you have problems or questions, contact your doctor. Follow these instructions at home: Insertion site care  Follow instructions from your doctor about how to take care of your long, thin tube (catheter) insertion area. Make sure you: ? Wash your hands with soap and water before you change your bandage (dressing). If you cannot use soap and water, use hand sanitizer. ? Change your bandage as told by your doctor. ? Leave stitches (sutures), skin glue, or skin tape (adhesive) strips in place. They may need to stay in place for 2 weeks or longer. If tape strips get loose and curl up, you may trim the loose edges. Do not remove tape strips completely unless your doctor says it is okay.  Do not take baths, swim, or use a hot tub until your doctor says it is okay.  You may shower 24-48 hours after the procedure or as told by your doctor. ? Gently wash the area with plain soap and water. ? Pat the area dry with a clean towel. ? Do not rub the area. This may cause bleeding.  Do not apply powder or lotion to the area. Keep the area clean and dry.  Check your insertion area every day for signs of infection. Check for: ? More redness, swelling, or pain. ? Fluid or blood. ? Warmth. ? Pus or a bad smell. Activity  Rest as told by your doctor, usually for 1-2 days.  Do not lift anything that is heavier than 10 lbs. (4.5 kg) or as told by your doctor.  Do not drive for 24 hours if you were given a medicine to help you relax (sedative).  Do not drive or use heavy machinery while taking prescription pain medicine. General instructions   Go back to your normal activities as told by your doctor, usually in about a week. Ask your doctor what activities are safe for you.  If the insertion area starts to bleed, lie flat and put  pressure on the area. If the bleeding does not stop, get help right away. This is an emergency.  Drink enough fluid to keep your pee (urine) clear or pale yellow.  Take over-the-counter and prescription medicines only as told by your doctor.  Keep all follow-up visits as told by your doctor. This is important. Contact a doctor if:  You have a fever.  You have chills.  You have more redness, swelling, or pain around your insertion area.  You have fluid or blood coming from your insertion area.  The insertion area feels warm to the touch.  You have pus or a bad smell coming from your insertion area.  You have more bruising around the insertion area.  Blood collects in the tissue around the insertion area (hematoma) that may be painful to the touch. Get help right away if:  You have a lot of pain in the insertion area.  The insertion area swells very fast.  The insertion area is bleeding, and the bleeding does not stop after holding steady pressure on the area.  The area near or just beyond the insertion area becomes pale, cool, tingly, or numb. These symptoms may be an emergency. Do not wait to see if the symptoms will go away. Get medical help right away. Call your local emergency services (911 in the [place] Do not drive yourself to the hospital.   Summary  After the procedure, it is common to have bruising and tenderness at the long, thin tube insertion area.  After the procedure, it is important to rest and drink plenty of fluids.  Do not take baths, swim, or use a hot tub until your doctor says it is okay to do so. You may shower 24-48 hours after the procedure or as told by your doctor.  If the insertion area starts to bleed, lie flat and put pressure on the area. If the bleeding does not stop, get help right away. This is an emergency. This information is not intended to replace advice given to you by your health care provider. Make sure you discuss any questions you have  with your health care provider. Document Revised: 04/20/2017 Document Reviewed: 05/02/2016 [employer] Patient Education  2020 [employer] Moderate Conscious Sedation, Adult Sedation is the use of medicines to promote relaxation and relieve discomfort and anxiety. Moderate conscious sedation is a type of sedation. Under moderate conscious sedation, you are less alert than normal, but you are still able to respond to instructions, touch, or both. Moderate conscious sedation is used during short medical and dental procedures. It is milder than deep sedation, which is a type of sedation under which you cannot be easily woken up. It is also milder than general anesthesia, which is the use of medicines to make you unconscious. Moderate conscious sedation allows you to return to your regular activities sooner. Tell a health care provider about:  Any allergies you have.  All medicines you are taking, including vitamins, herbs, eye drops, creams, and over-the-counter medicines.  Use of steroids (by mouth or creams).  Any problems you or family members have had with sedatives and anesthetic medicines.  Any blood disorders you have.  Any surgeries you have had.  Any medical conditions you have, such as sleep apnea.  Whether you are pregnant or may be pregnant.  Any use of cigarettes, alcohol, marijuana, or street drugs. What are the risks? Generally, this is a safe procedure. However, problems may occur, including:  Getting too much medicine (oversedation).  Nausea.  Allergic reaction to medicines.  Trouble breathing. If this happens, a breathing tube may be used to help with breathing. It will be removed when you are awake and breathing on your own.  Heart trouble.  Lung trouble. What happens before the procedure? Staying hydrated Follow instructions from your health care provider about hydration, which may include:  Up to 2 hours before the procedure - you may continue to drink  clear liquids, such as water, clear fruit juice, black coffee, and plain tea. Eating and drinking restrictions Follow instructions from your health care provider about eating and drinking, which may include:  8 hours before the procedure - stop eating heavy meals or foods such as meat, fried foods, or fatty foods.  6 hours before the procedure - stop eating light meals or foods, such as toast or cereal.  6 hours before the procedure - stop drinking milk or drinks that contain milk.  2 hours before the procedure - stop drinking clear liquids. Medicine Ask your health care provider about:  Changing or stopping your regular medicines. This is especially important if you are taking diabetes medicines or blood thinners.  Taking medicines such as aspirin and ibuprofen. These medicines can thin your blood. Do not take these medicines before your procedure if your health care provider instructs you not to.  Tests and exams  You will have a physical   exam.  You may have blood tests done to show: ? How well your kidneys and liver are working. ? How well your blood can clot. General instructions  Plan to have someone take you home from the hospital or clinic.  If you will be going home right after the procedure, plan to have someone with you for 24 hours. What happens during the procedure?  An IV tube will be inserted into one of your veins.  Medicine to help you relax (sedative) will be given through the IV tube.  The medical or dental procedure will be performed. What happens after the procedure?  Your blood pressure, heart rate, breathing rate, and blood oxygen level will be monitored often until the medicines you were given have worn off.  Do not drive for 24 hours. This information is not intended to replace advice given to you by your health care provider. Make sure you discuss any questions you have with your health care provider. Document Revised: 04/20/2017 Document Reviewed:  08/28/2015 [employer] Patient Education  2020 [employer]  

## 2020-03-15 ENCOUNTER — Other Ambulatory Visit: Payer: Self-pay | Admitting: Interventional Radiology

## 2020-03-15 ENCOUNTER — Encounter: Payer: Self-pay | Admitting: *Deleted

## 2020-03-15 DIAGNOSIS — L97923 Non-pressure chronic ulcer of unspecified part of left lower leg with necrosis of muscle: Secondary | ICD-10-CM

## 2020-03-15 DIAGNOSIS — L97913 Non-pressure chronic ulcer of unspecified part of right lower leg with necrosis of muscle: Secondary | ICD-10-CM

## 2020-03-25 ENCOUNTER — Ambulatory Visit
Admission: RE | Admit: 2020-03-25 | Discharge: 2020-03-25 | Disposition: A | Discharge status: Home / Self Care | Payer: Medicare Other | Source: Ambulatory Visit | Attending: Interventional Radiology | Admitting: Interventional Radiology

## 2020-03-25 ENCOUNTER — Encounter: Payer: Self-pay | Admitting: *Deleted

## 2020-03-25 ENCOUNTER — Other Ambulatory Visit: Payer: Self-pay | Admitting: Interventional Radiology

## 2020-03-25 DIAGNOSIS — L97913 Non-pressure chronic ulcer of unspecified part of right lower leg with necrosis of muscle: Secondary | ICD-10-CM

## 2020-03-25 DIAGNOSIS — M79604 Pain in right leg: Secondary | ICD-10-CM

## 2020-03-25 DIAGNOSIS — L97923 Non-pressure chronic ulcer of unspecified part of left lower leg with necrosis of muscle: Secondary | ICD-10-CM

## 2020-03-25 HISTORY — PX: IR RADIOLOGIST EVAL & MGMT: IMG5224

## 2020-03-25 NOTE — Progress Notes (Signed)
Chief Complaint: Right leg wound  Referring Physician(s): Dr. Leanord Hawking  History of Present Illness: Patricia [name] is a 69 y.o. female presenting as an early follow up to VIR clinic, SP revascularization of right CTO of the SFA.    She joins [name] today in the clinic.    She tells me that she has recovered fine after her endovascular procedure, performed 03/08/2020.  She tells me that about 1-2 week after her procedure she started having leg pain and swelling, which prompted [name] to schedule the follow up.   She underwent left CFA access and revasc of short right SFA occlusion with DEB.    She tells me that she thinks the foot wounds are improving, but the medial calf wound is the same.    She denies any fever rigors chills. She denies any SOB or DOE.   Otherwise she is fine.    She complains of ongoing lower back pain.   Repeat ABI today: Right:  Patient could not tolerate the study Segmental:  Triphasic waveforms are restored, improved from prior Left: 0.65  No past medical history on file.  Past Surgical History:  Procedure Laterality Date  . IR ANGIOGRAM EXTREMITY RIGHT  03/08/2020  . IR FEM POP ART PTA MOD SED  03/08/2020  . IR RADIOLOGIST EVAL & MGMT  02/24/2020  . IR RADIOLOGIST EVAL & MGMT  03/25/2020  . IR US GUIDE VASC ACCESS LEFT  03/08/2020    Allergies: Hydrocodone-acetaminophen  Medications: Prior to Admission medications   Medication Sig Start Date End Date Taking? Authorizing Provider  acetaminophen (TYLENOL) 650 MG CR tablet Take 1,300 mg by mouth 2 (two) times daily.    [provider]  albuterol (PROVENTIL) (2.5 MG/3ML) 0.083% nebulizer solution Take 3 mLs by nebulization every 6 (six) hours as needed for shortness of breath. 11/25/19   [provider]  albuterol (VENTOLIN HFA) 108 (90 Base) MCG/ACT inhaler Inhale 2 puffs into the lungs every 6 (six) hours as needed for shortness of breath. 10/28/19   [provider]    amLODipine (NORVASC) 10 MG tablet Take 10 mg by mouth daily.    [provider]  aspirin EC 81 MG tablet Take 81 mg by mouth daily. Swallow whole.    [provider]  atorvastatin (LIPITOR) 80 MG tablet Take 80 mg by mouth daily.    [provider]  diphenhydramine-acetaminophen (TYLENOL PM) 25-500 MG TABS tablet Take 2 tablets by mouth at bedtime as needed (sleep).    [provider]  ferrous sulfate 325 (65 FE) MG tablet Take 325 mg by mouth daily.    [provider]  furosemide (LASIX) 40 MG tablet Take 40 mg by mouth 2 (two) times daily.    [provider]  gabapentin (NEURONTIN) 300 MG capsule Take 300-900 mg by mouth See admin instructions. Take 900 mg in the morning, 300 mg at noon, and 900 mg at night    [provider]  glipiZIDE (GLUCOTROL XL) 10 MG 24 hr tablet Take 10 mg by mouth daily.    [provider]  magnesium oxide (MAG-OX) 400 MG tablet Take 400 mg by mouth 2 (two) times daily.    [provider]  meloxicam (MOBIC) 7.5 MG tablet Take 7.5 mg by mouth daily.    [provider]  metFORMIN (GLUCOPHAGE) 1000 MG tablet Take 1,000 mg by mouth 2 (two) times daily.    [provider]  metoprolol tartrate (LOPRESSOR) 100 MG  tablet Take 100 mg by mouth 2 (two) times daily.    [provider]  spironolactone (ALDACTONE) 25 MG tablet Take 25 mg by mouth daily.    [provider]  venlafaxine XR (EFFEXOR-XR) 150 MG 24 hr capsule Take 150 mg by mouth daily with breakfast.    [provider]     No family history on file.  Social History   Socioeconomic History  . Marital status: Divorced    Spouse name: Not on file  . Number of children: Not on file  . Years of education: Not on file  . Highest education level: Not on file  Occupational History  . Not on file  Tobacco Use  . Smoking status: Current Every Day Smoker    Packs/day: 1.50    Types: Cigarettes   Substance and Sexual Activity  . Alcohol use: Not on file  . Drug use: Not on file  . Sexual activity: Not on file  Other Topics Concern  . Not on file  Social History Narrative  . Not on file   Social Determinants of Health   Financial Resource Strain:   . Difficulty of Paying Living Expenses: Not on file  Food Insecurity:   . Worried About Programme researcher, broadcasting/film/video in the Last Year: Not on file  . Ran Out of Food in the Last Year: Not on file  Transportation Needs:   . Lack of Transportation (Medical): Not on file  . Lack of Transportation (Non-Medical): Not on file  Physical Activity:   . Days of Exercise per Week: Not on file  . Minutes of Exercise per Session: Not on file  Stress:   . Feeling of Stress : Not on file  Social Connections:   . Frequency of Communication with Friends and Family: Not on file  . Frequency of Social Gatherings with Friends and Family: Not on file  . Attends Religious Services: Not on file  . Active Member of Clubs or Organizations: Not on file  . Attends Banker Meetings: Not on file  . Marital Status: Not on file       Review of Systems: A 12 point ROS discussed and pertinent positives are indicated in the HPI above.  All other systems are negative.  Review of Systems  Vital Signs: There were no vitals taken for this visit.  Physical Exam General: 69 yo female appearing stated age.  Well-developed, well-nourished.  No distress. HEENT: Atraumatic, normocephalic  Conjugate gaze, extra-ocular motor intact. No scleral icterus or scleral injection. No lesions on external ears, nose, lips, or gums.  Oral mucosa moist, pink.  Neck: Symmetric with no goiter enlargement.  Chest/Lungs:  Symmetric chest with inspiration/expiration.  No labored breathing.    Heart:   No JVD appreciated.  Abdomen:  Soft, NT/ND, with + bowel sounds.   Genito-urinary: Deferred Neurologic: Alert & Oriented to person, place, and time.   Normal affect and  insight.  Appropriate questions.  Moving all 4 extremities with gross sensory intact. Using a walker for ambulation Pulse Exam:  Doppler signal + at the right AT and DP, multi-phasic.   Extremities: asymmetric lower extremity with the right calf slightly larger.  Warm to the touch.  Homan's sign is +.      There is persisting wound of medial calf with cratered border and 50% eschar.  No significant erythema border.  Non-painful  The wound on the dorsal second toe is improved, with epithelial tissue  And loss of  the eschar.  The forefoot erythema has resolved.  The forefoot wound at the base of the first toe is smaller centrally.    Pre-op:        Post op:          Mallampati Score:  Imaging: IR Angiogram Extremity Right  Result Date: 03/08/2020 INDICATION: 69 year old female with a history of right-sided critical limb ischemia/chronic limb threatening ischemia, with evidence of SFA occlusion on noninvasive exam, presents for angiogram and possible treatment for limb salvage and wound healing. EXAM: ULTRASOUND-GUIDED ACCESS LEFT COMMON FEMORAL ARTERY RIGHT LOWER EXTREMITY ANGIOGRAM TREATMENT CHRONIC TOTAL OCCLUSION RIGHT SFA WITH DRUG-ELUTING BALLOON ANGIOPLASTY ANGIO-SEAL FOR HEMOSTASIS MEDICATIONS: 9000 units IV heparin.  50 mg IV protamine sulfate ANESTHESIA/SEDATION: Moderate (conscious) sedation was employed during this procedure. A total of Versed 2.0 mg and Fentanyl 100 mcg, 1.0 mg Dilaudid was administered intravenously. Moderate Sedation Time: 69 minutes. The patient's level of consciousness and vital signs were monitored continuously by radiology nursing throughout the procedure under my direct supervision. CONTRAST:  88 cc visit Paik 320 FLUOROSCOPY TIME:  Fluoroscopy Time: 13 minutes 54 seconds (162 mGy). COMPLICATIONS: None PROCEDURE: Informed consent was obtained from the patient following explanation of the procedure, risks, benefits and alternatives. The patient  understands, agrees and consents for the procedure. All questions were addressed. A time out was performed prior to the initiation of the procedure. Maximal barrier sterile technique utilized including caps, mask, sterile gowns, sterile gloves, large sterile drape, hand hygiene, and Betadine prep. Ultrasound survey of the left inguinal region was performed with images stored and sent to PACs, confirming patency of the vessel. A micropuncture needle was used access the left common femoral artery under ultrasound. With excellent arterial blood flow returned, and an .018 micro wire was passed through the needle, observed enter the abdominal aorta under fluoroscopy. The needle was removed, and a micropuncture sheath was placed over the wire. The inner dilator and wire were removed, and an 035 Bentson wire was advanced under fluoroscopy into the abdominal aorta. The sheath was removed and a standard 5 [place] vascular sheath was placed. The dilator was removed and the sheath was flushed. Omni Flush catheter was advanced over the Bentson wire to the abdominal aorta. Angiogram was performed. Omni Flush catheter was used to navigate a Bentson wire into the external iliac artery. The Omni Flush catheter was removed and a 90 cm navigate cross catheter was advanced to the external iliac artery. Right lower extremity angiogram was performed. Never cross catheter was used to place the Bentson wire into the proximal SFA, and the 5 [place] sheath was exchanged for a 45 cm straight to room 0 destination sheath. With the destination sheath positioned in the distal external iliac artery, the proximal face of the SFA occlusion was addressed. IV heparinization was performed with a weight based dose. The navigate cross catheter was used to navigate across the proximal face of the occlusion using a combination of a Glidewire and a Bentson wire. A distal catheter position in the superficial femoral artery was achieved. A 300 cm Spartacore  wire was then placed into the popliteal segment. Balloon angioplasty of the SFA occlusion was then performed with a 4 mm x 150 mm standard balloon angioplasty. Repeat angiogram was performed. We then treated the diseased segment with a 5 mm x 150 mm drug-eluting balloon. After deployment of 3 minutes, a more distal position was achieved for a standard balloon angioplasty at a site of dense calcium in the SFA  segment. The balloon was then removed and a final angiogram was performed through the right lower extremity/foot. Angio-Seal was then deployed for hemostasis after reversal of the full heparinization with protamine sulfate. Excellent pulses were confirmed at the right PT and AT points, improved from the initial. Patient was experiencing some lumbar back pain during the case, though otherwise tolerated the procedure well. No complications were encountered and no significant blood loss. FINDINGS: Ultrasound demonstrates patent left common femoral artery Inferior aorta and the pelvic angiogram confirms mild atherosclerotic changes of the bilateral common iliac arteries without high-grade stenosis. Right hypogastric arteries patent, with what appears to be occlusion or critical stenosis of the proximal left hypogastric artery. Angiogram reveals a proximal occlusion of the left SFA. There is occlusion of the proximal third of the right SFA, with a branch point contributing to collateral filling of the thigh vessels. No significant common femoral artery atherosclerotic changes. Profunda femoris and the circumflex femoral arteries remain patent. Reconstitution of the right SFA is achieved in the thigh segment. A second stenosis is evident at a site of calcification at the proximal adductor canal. The trifurcation is patent with less than 50% narrowing of the tibioperoneal trunk secondary to atherosclerotic changes. After treatment with 4 mm plain balloon angioplasty and 5 mm drug-eluting balloon angioplasty, there is  0% residual stenosis of the right SFA. In-line flow to the hindfoot and the midfoot arch eve did through patent anterior tibial artery and posterior tibial artery. 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 Improved AT and PT pulses at the conclusion. 100% improved perfusion through the right SFA after treatment IMPRESSION: Status post ultrasound-guided access left common femoral artery for right lower extremity angiogram and treatment of SFA occlusion with combination of standard balloon angioplasty and drug-eluting balloon angioplasty, with restoration of inline flow to the foot. Angio-Seal for hemostasis. Signed, Yvone Neu. Reyne Dumas, RPVI [facility] Radiology Electronically Signed   By: Gilmer Mor D.O.   On: 03/08/2020 12:07   IR FEM POP ART PTA MOD SED  Result Date: 03/08/2020 INDICATION: 69 year old female with a history of right-sided critical limb ischemia/chronic limb threatening ischemia, with evidence of SFA occlusion on noninvasive exam, presents for angiogram and possible treatment for limb salvage and wound healing. EXAM: ULTRASOUND-GUIDED ACCESS LEFT COMMON FEMORAL ARTERY RIGHT LOWER EXTREMITY ANGIOGRAM TREATMENT CHRONIC TOTAL OCCLUSION RIGHT SFA WITH DRUG-ELUTING BALLOON ANGIOPLASTY ANGIO-SEAL FOR HEMOSTASIS MEDICATIONS: 9000 units IV heparin.  50 mg IV protamine sulfate ANESTHESIA/SEDATION: Moderate (conscious) sedation was employed during this procedure. A total of Versed 2.0 mg and Fentanyl 100 mcg, 1.0 mg Dilaudid was administered intravenously. Moderate Sedation Time: 69 minutes. The patient's level of consciousness and vital signs were monitored continuously by radiology nursing throughout the procedure under my direct supervision. CONTRAST:  88 cc visit Paik 320 FLUOROSCOPY TIME:  Fluoroscopy Time: 13 minutes 54 seconds (162 mGy). COMPLICATIONS: None PROCEDURE: Informed consent was obtained  from the patient following explanation of the procedure, risks, benefits and alternatives. The patient understands, agrees and consents for the procedure. All questions were addressed. A time out was performed prior to the initiation of the procedure. Maximal barrier sterile technique utilized including caps, mask, sterile gowns, sterile gloves, large sterile drape, hand hygiene, and Betadine prep. Ultrasound survey of the left inguinal region was performed with images stored and sent to PACs, confirming patency of the vessel. A micropuncture needle was used access the left common femoral artery  under ultrasound. With excellent arterial blood flow returned, and an .018 micro wire was passed through the needle, observed enter the abdominal aorta under fluoroscopy. The needle was removed, and a micropuncture sheath was placed over the wire. The inner dilator and wire were removed, and an 035 Bentson wire was advanced under fluoroscopy into the abdominal aorta. The sheath was removed and a standard 5 [place] vascular sheath was placed. The dilator was removed and the sheath was flushed. Omni Flush catheter was advanced over the Bentson wire to the abdominal aorta. Angiogram was performed. Omni Flush catheter was used to navigate a Bentson wire into the external iliac artery. The Omni Flush catheter was removed and a 90 cm navigate cross catheter was advanced to the external iliac artery. Right lower extremity angiogram was performed. Never cross catheter was used to place the Bentson wire into the proximal SFA, and the 5 [place] sheath was exchanged for a 45 cm straight to room 0 destination sheath. With the destination sheath positioned in the distal external iliac artery, the proximal face of the SFA occlusion was addressed. IV heparinization was performed with a weight based dose. The navigate cross catheter was used to navigate across the proximal face of the occlusion using a combination of a Glidewire and a Bentson  wire. A distal catheter position in the superficial femoral artery was achieved. A 300 cm Spartacore wire was then placed into the popliteal segment. Balloon angioplasty of the SFA occlusion was then performed with a 4 mm x 150 mm standard balloon angioplasty. Repeat angiogram was performed. We then treated the diseased segment with a 5 mm x 150 mm drug-eluting balloon. After deployment of 3 minutes, a more distal position was achieved for a standard balloon angioplasty at a site of dense calcium in the SFA segment. The balloon was then removed and a final angiogram was performed through the right lower extremity/foot. Angio-Seal was then deployed for hemostasis after reversal of the full heparinization with protamine sulfate. Excellent pulses were confirmed at the right PT and AT points, improved from the initial. Patient was experiencing some lumbar back pain during the case, though otherwise tolerated the procedure well. No complications were encountered and no significant blood loss. FINDINGS: Ultrasound demonstrates patent left common femoral artery Inferior aorta and the pelvic angiogram confirms mild atherosclerotic changes of the bilateral common iliac arteries without high-grade stenosis. Right hypogastric arteries patent, with what appears to be occlusion or critical stenosis of the proximal left hypogastric artery. Angiogram reveals a proximal occlusion of the left SFA. There is occlusion of the proximal third of the right SFA, with a branch point contributing to collateral filling of the thigh vessels. No significant common femoral artery atherosclerotic changes. Profunda femoris and the circumflex femoral arteries remain patent. Reconstitution of the right SFA is achieved in the thigh segment. A second stenosis is evident at a site of calcification at the proximal adductor canal. The trifurcation is patent with less than 50% narrowing of the tibioperoneal trunk secondary to atherosclerotic changes.  After treatment with 4 mm plain balloon angioplasty and 5 mm drug-eluting balloon angioplasty, there is 0% residual stenosis of the right SFA. In-line flow to the hindfoot and the midfoot arch eve did through patent anterior tibial artery and posterior tibial artery. 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 Improved AT and PT pulses at the conclusion. 100% improved perfusion through the right SFA after treatment IMPRESSION: Status post ultrasound-guided access left  common femoral artery for right lower extremity angiogram and treatment of SFA occlusion with combination of standard balloon angioplasty and drug-eluting balloon angioplasty, with restoration of inline flow to the foot. Angio-Seal for hemostasis. Signed, Yvone Neu. Reyne Dumas, RPVI [facility] Radiology Electronically Signed   By: Gilmer Mor D.O.   On: 03/08/2020 12:07   IR US Guide Vasc Access Left  Result Date: 03/08/2020 INDICATION: 69 year old female with a history of right-sided critical limb ischemia/chronic limb threatening ischemia, with evidence of SFA occlusion on noninvasive exam, presents for angiogram and possible treatment for limb salvage and wound healing. EXAM: ULTRASOUND-GUIDED ACCESS LEFT COMMON FEMORAL ARTERY RIGHT LOWER EXTREMITY ANGIOGRAM TREATMENT CHRONIC TOTAL OCCLUSION RIGHT SFA WITH DRUG-ELUTING BALLOON ANGIOPLASTY ANGIO-SEAL FOR HEMOSTASIS MEDICATIONS: 9000 units IV heparin.  50 mg IV protamine sulfate ANESTHESIA/SEDATION: Moderate (conscious) sedation was employed during this procedure. A total of Versed 2.0 mg and Fentanyl 100 mcg, 1.0 mg Dilaudid was administered intravenously. Moderate Sedation Time: 69 minutes. The patient's level of consciousness and vital signs were monitored continuously by radiology nursing throughout the procedure under my direct supervision. CONTRAST:  88 cc visit Paik 320 FLUOROSCOPY TIME:   Fluoroscopy Time: 13 minutes 54 seconds (162 mGy). COMPLICATIONS: None PROCEDURE: Informed consent was obtained from the patient following explanation of the procedure, risks, benefits and alternatives. The patient understands, agrees and consents for the procedure. All questions were addressed. A time out was performed prior to the initiation of the procedure. Maximal barrier sterile technique utilized including caps, mask, sterile gowns, sterile gloves, large sterile drape, hand hygiene, and Betadine prep. Ultrasound survey of the left inguinal region was performed with images stored and sent to PACs, confirming patency of the vessel. A micropuncture needle was used access the left common femoral artery under ultrasound. With excellent arterial blood flow returned, and an .018 micro wire was passed through the needle, observed enter the abdominal aorta under fluoroscopy. The needle was removed, and a micropuncture sheath was placed over the wire. The inner dilator and wire were removed, and an 035 Bentson wire was advanced under fluoroscopy into the abdominal aorta. The sheath was removed and a standard 5 [place] vascular sheath was placed. The dilator was removed and the sheath was flushed. Omni Flush catheter was advanced over the Bentson wire to the abdominal aorta. Angiogram was performed. Omni Flush catheter was used to navigate a Bentson wire into the external iliac artery. The Omni Flush catheter was removed and a 90 cm navigate cross catheter was advanced to the external iliac artery. Right lower extremity angiogram was performed. Never cross catheter was used to place the Bentson wire into the proximal SFA, and the 5 [place] sheath was exchanged for a 45 cm straight to room 0 destination sheath. With the destination sheath positioned in the distal external iliac artery, the proximal face of the SFA occlusion was addressed. IV heparinization was performed with a weight based dose. The navigate cross catheter  was used to navigate across the proximal face of the occlusion using a combination of a Glidewire and a Bentson wire. A distal catheter position in the superficial femoral artery was achieved. A 300 cm Spartacore wire was then placed into the popliteal segment. Balloon angioplasty of the SFA occlusion was then performed with a 4 mm x 150 mm standard balloon angioplasty. Repeat angiogram was performed. We then treated the diseased segment with a 5 mm x 150 mm drug-eluting balloon. After deployment of 3 minutes, a more distal  position was achieved for a standard balloon angioplasty at a site of dense calcium in the SFA segment. The balloon was then removed and a final angiogram was performed through the right lower extremity/foot. Angio-Seal was then deployed for hemostasis after reversal of the full heparinization with protamine sulfate. Excellent pulses were confirmed at the right PT and AT points, improved from the initial. Patient was experiencing some lumbar back pain during the case, though otherwise tolerated the procedure well. No complications were encountered and no significant blood loss. FINDINGS: Ultrasound demonstrates patent left common femoral artery Inferior aorta and the pelvic angiogram confirms mild atherosclerotic changes of the bilateral common iliac arteries without high-grade stenosis. Right hypogastric arteries patent, with what appears to be occlusion or critical stenosis of the proximal left hypogastric artery. Angiogram reveals a proximal occlusion of the left SFA. There is occlusion of the proximal third of the right SFA, with a branch point contributing to collateral filling of the thigh vessels. No significant common femoral artery atherosclerotic changes. Profunda femoris and the circumflex femoral arteries remain patent. Reconstitution of the right SFA is achieved in the thigh segment. A second stenosis is evident at a site of calcification at the proximal adductor canal. The  trifurcation is patent with less than 50% narrowing of the tibioperoneal trunk secondary to atherosclerotic changes. After treatment with 4 mm plain balloon angioplasty and 5 mm drug-eluting balloon angioplasty, there is 0% residual stenosis of the right SFA. In-line flow to the hindfoot and the midfoot arch eve did through patent anterior tibial artery and posterior tibial artery. 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 Improved AT and PT pulses at the conclusion. 100% improved perfusion through the right SFA after treatment IMPRESSION: Status post ultrasound-guided access left common femoral artery for right lower extremity angiogram and treatment of SFA occlusion with combination of standard balloon angioplasty and drug-eluting balloon angioplasty, with restoration of inline flow to the foot. Angio-Seal for hemostasis. Signed, Yvone Neu. Reyne Dumas, RPVI Vascular and [facility] Radiology Electronically Signed   By: Gilmer Mor D.O.   On: 03/08/2020 12:07   IR Radiologist Eval & Mgmt  Result Date: 03/25/2020 Please refer to notes tab for details about interventional procedure. (Op Note)   Labs:  CBC: Recent Labs    03/08/20 0818  WBC 7.3  HGB 12.4  HCT 40.7  PLT 306    COAGS: Recent Labs    03/08/20 0818  INR 0.9    BMP: Recent Labs    03/08/20 0818  NA 138  K 4.9  CL 103  CO2 25  GLUCOSE 165*  BUN 30*  CALCIUM 9.9  CREATININE 1.28*  GFRNONAA 43*    LIVER FUNCTION TESTS: No results for input(s): BILITOT, AST, ALT, ALKPHOS, PROT, ALBUMIN in the last 8760 hours.  TUMOR MARKERS: No results for input(s): AFPTM, CEA, CA199, CHROMGRNA in the last 8760 hours.  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    Non-invasive imaging shows restored triphasic waveforms of the right ankle.  ABI was not  calculated given her pain at the lower leg.       We discussed the need for anti-platelets and maximal medical therapy.  She is most concerned with the new pain that she has at her lower leg.  The ongoing back pain is a continual problem for her.   The  new right leg swelling pain is of concern, and we will need to schedule a duplex for possible new DVT.  The alternative diagnosis would be a post-reperfusion swelling, due to loss of the body's autoregulatory mechanism in the setting of CLI/wounds, and now improved flow.    She is here with transport from [facility], and will not be able to go to appointment locally, or go to local ED. We will get her an appointment at [facility] for duplex.    Maximal medical therapy for reduction of risk factors is indicated as recommended by updated AHA guidelines1.  This includes anti-platelet medication, tight blood glucose control to a HbA1c < 7, tight blood pressure control, maximum-dose HMG-CoA reductase inhibitor, and smoking cessation.     Annual flu vaccination is also recommended, with Class 1 recommendation1.   Plan: -I have prescribed 3 days of Vicodin (she has recorded allergy but states that it is not true allergy and that is preferred medication), #18, for post op pain.   - We will pursue a right lower DVT study.  Should she have a positive study, we will need to have her withdraw the plavix and continue aspirin when/if she starts her blood thinner.  We will follow up the study, and if need be get the result to her PCP's office. - we will set her up for repeat appointment in 6 months with office visit -Continue max medical therapy - recommend follow up with wound care for the calf wound foot wounds  -Annual flu vaccination is recommended in the setting of known PAD, in the absence of contra-indications.    ___________________________________________________________________   1Monte Fantasia MD, et al. 2016 AHA/ACC Guideline on the Management  of Patients With Lower Extremity Peripheral Artery Disease: Executive Summary: A Report of the American College of Cardiology/American Heart Association Task Force on Clinical Practice Guidelines. J Am Coll Cardiol. 2017 Mar 21;69(11):1465-1508. doi: 10.1016/j.jacc.2016.11.008.   2 - Norgren L, et al. TASC II Working Group. Inter-society consensus for the management of peripheral arterial disease. Int Nunzio Cobbs. 2007 Jun;26(2):81-157. Review. PubMed PMID: 47829562  3 - Hingorani A, et al. The management of diabetic foot: A clinical practice guideline by the Society for Vascular Surgery in collaboration with the American Podiatric Medical Association and the Society  for Vascular Medicine. J Vasc Surg. 2016 Feb;63(2 Suppl):3S-21S. doi: 10.1016/j.jvs.2015.10.003. PubMed PMID: 13086578.  4 - Luther Hearing, Saab FA, Elyse Jarvis, Danae Orleans, Deeann Cree, Driver VR, [place], Lookstein R, van den Tilman Neat, Jaff MR, Reinaldo Raddle, Henao S, AlMahameed A, Katzen B. Digital Subtraction Angiography Prior to an Amputation for Critical Limb Ischemia (CLI): An Expert Recommendation Statement From the CLI Global Society to Optimize Limb Salvage. J Endovasc Ther. 2020 Aug;27(4):540-546. doi: 10.1177/1526602820928590. Epub 2020 May 29. PMID: 46962952.     Electronically Signed: Gilmer Mor 03/25/2020, 5:26 PM   I spent a total of    40 Minutes in face to face in clinical consultation, greater than 50% of which was counseling/coordinating care for right lower extremity CLI/CLTI, sp revascularization.

## 2020-03-26 ENCOUNTER — Inpatient Hospital Stay: Admission: RE | Admit: 2020-03-26 | Payer: Medicare Other | Source: Ambulatory Visit

## 2020-05-04 ENCOUNTER — Encounter (HOSPITAL_BASED_OUTPATIENT_CLINIC_OR_DEPARTMENT_OTHER): Payer: Medicare Other | Admitting: Internal Medicine

## 2020-05-05 ENCOUNTER — Other Ambulatory Visit: Payer: Self-pay

## 2020-05-05 ENCOUNTER — Encounter (HOSPITAL_BASED_OUTPATIENT_CLINIC_OR_DEPARTMENT_OTHER): Payer: Medicare Other | Attending: Physician Assistant | Admitting: Physician Assistant

## 2020-05-05 DIAGNOSIS — I5042 Chronic combined systolic (congestive) and diastolic (congestive) heart failure: Secondary | ICD-10-CM | POA: Diagnosis not present

## 2020-05-05 DIAGNOSIS — E1142 Type 2 diabetes mellitus with diabetic polyneuropathy: Secondary | ICD-10-CM | POA: Insufficient documentation

## 2020-05-05 DIAGNOSIS — E11622 Type 2 diabetes mellitus with other skin ulcer: Secondary | ICD-10-CM | POA: Diagnosis not present

## 2020-05-05 DIAGNOSIS — L97812 Non-pressure chronic ulcer of other part of right lower leg with fat layer exposed: Secondary | ICD-10-CM | POA: Insufficient documentation

## 2020-05-05 DIAGNOSIS — I10 Essential (primary) hypertension: Secondary | ICD-10-CM | POA: Diagnosis not present

## 2020-05-05 DIAGNOSIS — F17200 Nicotine dependence, unspecified, uncomplicated: Secondary | ICD-10-CM | POA: Diagnosis not present

## 2020-05-05 DIAGNOSIS — J449 Chronic obstructive pulmonary disease, unspecified: Secondary | ICD-10-CM | POA: Diagnosis not present

## 2020-05-05 DIAGNOSIS — I7389 Other specified peripheral vascular diseases: Secondary | ICD-10-CM | POA: Insufficient documentation

## 2020-05-05 NOTE — Progress Notes (Signed)
VIVIANE, SEMIDEY ([id number]) Visit Report for 05/05/2020 Allergy List Details Patient Name: Date of Service: JANEAN, EISCHEN 05/05/2020 9:00 A M Medical Record Number: [id number] Patient Account Number: [id number] Date of Birth/Sex: Treating RN: 08/12/1950 (69 y.o. Arta Silence Primary Care Kamora Vossler: Romilda Garret Other Clinician: Referring Tatsuo Musial: Treating Miriana Gaertner/Extender: Lenda Kelp SA RGENT, JULIA Weeks in Treatment: 0 Allergies Active Allergies Panlor (hydrocodone-acetamin) Reaction: itching Allergy Notes Electronic Signature(s) Signed: 05/05/2020 5:09:50 PM By: Shawn Stall Entered By: Shawn Stall on 05/05/2020 10:12:14 -------------------------------------------------------------------------------- Arrival Information Details Patient Name: Date of Service: [name] 05/05/2020 9:00 A M Medical Record Number: [id number] Patient Account Number: [id number] Date of Birth/Sex: Treating RN: 17-Mar-1951 (69 y.o. Arta Silence Primary Care Giovanny Dugal: Romilda Garret Other Clinician: Referring Grier Vu: Treating Kashish Yglesias/Extender: Lenda Kelp SA RGENT, JULIA Weeks in Treatment: 0 Visit Information Patient Arrived: Walker Arrival Time: 10:06 Accompanied By: self Transfer Assistance: None Patient Identification Verified: Yes Secondary Verification Process Completed: Yes Patient Requires Transmission-Based Precautions: No Patient Has Alerts: Yes Patient Alerts: Patient on Blood Thinner History Since Last Visit All ordered tests and consults were completed: Yes Added or deleted any medications: No Any new allergies or adverse reactions: No Had a fall or experienced change in activities of daily living that may affect risk of falls: Yes Signs or symptoms of abuse/neglect since last visito No Hospitalized since last visit: No Implantable device outside of the clinic excluding cellular tissue based products placed in the center since last visit:  No Has Dressing in Place as Prescribed: Yes Pain Present Now: Yes Electronic Signature(s) Signed: 05/05/2020 5:09:50 PM By: Shawn Stall Entered By: Shawn Stall on 05/05/2020 10:11:17 -------------------------------------------------------------------------------- Clinic Level of Care Assessment Details Patient Name: Date of Service: XYLIA, SCHERGER 05/05/2020 9:00 A M Medical Record Number: [id number] Patient Account Number: [id number] Date of Birth/Sex: Treating RN: 1950/10/25 (69 y.o. Tommye Standard Primary Care Damien Batty: Romilda Garret Other Clinician: Referring Zitlaly Malson: Treating Pinkie Manger/Extender: Lenda Kelp SA RGENT, JULIA Weeks in Treatment: 0 Clinic Level of Care Assessment Items TOOL 2 Quantity Score []  - 0 Use when only an EandM is performed on the INITIAL visit ASSESSMENTS - Nursing Assessment / Reassessment X- 1 20 General Physical Exam (combine w/ comprehensive assessment (listed just below) when performed on new pt. evals) X- 1 25 Comprehensive Assessment (HX, ROS, Risk Assessments, Wounds Hx, etc.) ASSESSMENTS - Wound and Skin A ssessment / Reassessment X - Simple Wound Assessment / Reassessment - one wound 1 5 []  - 0 Complex Wound Assessment / Reassessment - multiple wounds []  - 0 Dermatologic / Skin Assessment (not related to wound area) ASSESSMENTS - Ostomy and/or Continence Assessment and Care []  - 0 Incontinence Assessment and Management []  - 0 Ostomy Care Assessment and Management (repouching, etc.) PROCESS - Coordination of Care X - Simple Patient / Family Education for ongoing care 1 15 []  - 0 Complex (extensive) Patient / Family Education for ongoing care X- 1 10 Staff obtains , Records, T Results / Process Orders est X- 1 10 Staff telephones HHA, Nursing Homes / Clarify orders / etc []  - 0 Routine Transfer to another Facility (non-emergent condition) []  - 0 Routine Hospital Admission (non-emergent condition) X- 1  15 New Admissions / / Ordering NPWT Apligraf, etc. , []  - 0 Emergency Hospital Admission (emergent condition) X- 1 10 Simple Discharge Coordination []  - 0 Complex (extensive) Discharge Coordination PROCESS - Special Needs []  - 0 Pediatric / Minor Patient Management []  -  0 Isolation Patient Management  - 0 Hearing / Language / Visual special needs  - 0 Assessment of Community assistance (transportation, D/C planning, etc.)  - 0 Additional assistance / Altered mentation  - 0 Support Surface(s) Assessment (bed, cushion, seat, etc.) INTERVENTIONS - Wound Cleansing / Measurement X- 1 5 Wound Imaging (photographs - any number of wounds)  - 0 Wound Tracing (instead of photographs) X- 1 5 Simple Wound Measurement - one wound  - 0 Complex Wound Measurement - multiple wounds X- 1 5 Simple Wound Cleansing - one wound  - 0 Complex Wound Cleansing - multiple wounds INTERVENTIONS - Wound Dressings X - Small Wound Dressing one or multiple wounds 1 10  - 0 Medium Wound Dressing one or multiple wounds  - 0 Large Wound Dressing one or multiple wounds  - 0 Application of Medications - injection INTERVENTIONS - Miscellaneous  - 0 External ear exam  - 0 Specimen Collection (cultures, biopsies, blood, body fluids, etc.)  - 0 Specimen(s) / Culture(s) sent or taken to Lab for analysis  - 0 Patient Transfer (multiple staff / Nurse, adult / Similar devices)  - 0 Simple Staple / Suture removal (25 or less)  - 0 Complex Staple / Suture removal (26 or more)  - 0 Hypo / Hyperglycemic Management (close monitor of Blood Glucose) X- 1 15 Ankle / Brachial Index (ABI) - do not check if billed separately Has the patient been seen at the hospital within the last three years: Yes Total Score: 150 Level Of Care: New/Established - Level 4 Electronic Signature(s) Signed: 05/05/2020 5:29:15 PM By: Zenaida Deed RN, BSN Entered By:  Zenaida Deed on 05/05/2020 11:10:19 -------------------------------------------------------------------------------- Encounter Discharge Information Details Patient Name: Date of Service: [name]. 05/05/2020 9:00 A M Medical Record Number: [id number] Patient Account Number: [id number] Date of Birth/Sex: Treating RN: 08-26-1950 (69 y.o. Freddy Finner Primary Care Mabel Unrein: Romilda Garret Other Clinician: Referring Taheem Fricke: Treating Aune Adami/Extender: Lenda Kelp SA RGENT, JULIA Weeks in Treatment: 0 Encounter Discharge Information Items Discharge Condition: Stable Ambulatory Status: Ambulatory Discharge Destination: Home Transportation: Private Auto Accompanied By: self Schedule Follow-up Appointment: Yes Clinical Summary of Care: Patient Declined Electronic Signature(s) Signed: 05/05/2020 5:05:31 PM By: Yevonne Pax RN Entered By: Yevonne Pax on 05/05/2020 11:23:52 -------------------------------------------------------------------------------- Lower Extremity Assessment Details Patient Name: Date of Service: KENNISHA, QIN 05/05/2020 9:00 A M Medical Record Number: [id number] Patient Account Number: [id number] Date of Birth/Sex: Treating RN: May 17, 1951 (69 y.o. Arta Silence Primary Care Kyndall Amero: Patrick Jupiter, JULIA Other Clinician: Referring Khalee Mazo: Treating Vinessa Macconnell/Extender: Lenda Kelp SA RGENT, JULIA Weeks in Treatment: 0 Edema Assessment Assessed: [Left: No] [Right: Yes] Edema: [Left: Ye] [Right: s] Calf Left: Right: Point of Measurement: 11 cm From Medial Instep 35.4 cm Ankle Left: Right: Point of Measurement: 30 cm From Medial Instep 21 cm Knee To Floor Left: Right: From Medial Instep 38 cm Vascular Assessment Pulses: Dorsalis Pedis Palpable: [Right:Yes] Posterior Tibial Palpable: [Right:Yes] Blood Pressure: Brachial: [Right:147] Ankle: [Right:Dorsalis Pedis: 190 1.29] Electronic Signature(s) Signed: 05/05/2020 5:09:50 PM  By: Shawn Stall Entered By: Shawn Stall on 05/05/2020 10:25:32 -------------------------------------------------------------------------------- Multi-Disciplinary Care Plan Details Patient Name: Date of Service: [name] 05/05/2020 9:00 A M Medical Record Number: [id number] Patient Account Number: [id number] Date of Birth/Sex: Treating RN: Sep 07, 1950 (69 y.o. Tommye Standard Primary Care Omah Dewalt: Romilda Garret Other Clinician: Referring Dalbert Stillings: Treating Julis Haubner/Extender: Lenda Kelp SA RGENT, JULIA Weeks in Treatment: 0 Active Inactive Abuse / Safety / Falls / Self Care  Management Nursing Diagnoses: Potential for falls Goals: Patient/caregiver will verbalize/demonstrate measures taken to prevent injury and/or falls Date Initiated: 05/05/2020 Target Resolution Date: 06/02/2020 Goal Status: Active Interventions: Assess fall risk on admission and as needed Assess impairment of mobility on admission and as needed per policy Notes: Nutrition Nursing Diagnoses: Impaired glucose control: actual or potential Potential for alteratiion in Nutrition/Potential for imbalanced nutrition Goals: Patient/caregiver will maintain therapeutic glucose control Date Initiated: 05/05/2020 Target Resolution Date: 06/02/2020 Goal Status: Active Interventions: Assess HgA1c results as ordered upon admission and as needed Assess patient nutrition upon admission and as needed per policy Provide education on elevated blood sugars and impact on wound healing Treatment Activities: Patient referred to Primary Care Physician for further nutritional evaluation : 05/05/2020 Notes: Wound/Skin Impairment Nursing Diagnoses: Impaired tissue integrity Knowledge deficit related to ulceration/compromised skin integrity Goals: Patient/caregiver will verbalize understanding of skin care regimen Date Initiated: 05/05/2020 Target Resolution Date: 06/02/2020 Goal Status: Active Ulcer/skin  breakdown will have a volume reduction of 30% by week 4 Date Initiated: 05/05/2020 Target Resolution Date: 06/02/2020 Goal Status: Active Interventions: Assess patient/caregiver ability to obtain necessary supplies Assess patient/caregiver ability to perform ulcer/skin care regimen upon admission and as needed Assess ulceration(s) every visit Treatment Activities: Skin care regimen initiated : 05/05/2020 Topical wound management initiated : 05/05/2020 Notes: Electronic Signature(s) Signed: 05/05/2020 5:29:15 PM By: Zenaida Deed RN, BSN Entered By: Zenaida Deed on 05/05/2020 11:08:54 -------------------------------------------------------------------------------- Pain Assessment Details Patient Name: Date of Service: [name] 05/05/2020 9:00 A M Medical Record Number: [id number] Patient Account Number: [id number] Date of Birth/Sex: Treating RN: October 30, 1950 (69 y.o. Arta Silence Primary Care Tykeria Wawrzyniak: Romilda Garret Other Clinician: Referring Envy Meno: Treating Marlaya Turck/Extender: Lenda Kelp SA RGENT, JULIA Weeks in Treatment: 0 Active Problems Location of Pain Severity and Description of Pain Patient Has Paino Yes Site Locations Pain Location: Pain Location: Pain in Ulcers Rate the pain. Current Pain Level: 8 Worst Pain Level: 10 Least Pain Level: 0 Tolerable Pain Level: 8 Character of Pain Describe the Pain: Heavy, Sharp Pain Management and Medication Current Pain Management: Medication: Yes Cold Application: No Rest: Yes Massage: No Activity: No T.E.N.S.: No Heat Application: No Leg drop or elevation: Yes Is the Current Pain Management Adequate: Adequate How does your wound impact your activities of daily livingo Sleep: No Bathing: No Appetite: No Relationship With Others: No Bladder Continence: No Emotions: No Bowel Continence: No Work: No Toileting: No Drive: No Dressing: No Hobbies: No Electronic Signature(s) Signed:  05/05/2020 5:09:50 PM By: Shawn Stall Entered By: Shawn Stall on 05/05/2020 10:22:32 -------------------------------------------------------------------------------- Patient/Caregiver Education Details Patient Name: Date of Service: [name] 12/15/2021andnbsp9:00 A M Medical Record Number: [id number] Patient Account Number: [id number] Date of Birth/Gender: Treating RN: 17-Aug-1950 (69 y.o. Tommye Standard Primary Care Physician: Romilda Garret Other Clinician: Referring Physician: Treating Physician/Extender: Lenda Kelp SA RGENT, JULIA Weeks in Treatment: 0 Education Assessment Education Provided To: Patient Education Topics Provided Elevated Blood Sugar/ Impact on Healing: Methods: Explain/Verbal Responses: Reinforcements needed, State content correctly Wound/Skin Impairment: Handouts: Caring for Your Ulcer, Skin Care Do's and Dont's Methods: Explain/Verbal Responses: Reinforcements needed, State content correctly Electronic Signature(s) Signed: 05/05/2020 5:29:15 PM By: Zenaida Deed RN, BSN Entered By: Zenaida Deed on 05/05/2020 11:09:23 -------------------------------------------------------------------------------- Wound Assessment Details Patient Name: Date of Service: [name] 05/05/2020 9:00 A M Medical Record Number: [id number] Patient Account Number: [id number] Date of Birth/Sex: Treating RN: [birth year] (69 y.o. Arta Silence Primary Care Danyetta Gillham: Romilda Garret Other Clinician:  Referring Ambers Iyengar: Treating Rey Fors/Extender: Lenda Kelp SA RGENT, JULIA Weeks in Treatment: 0 Wound Status Wound Number: 6 Primary Diabetic Wound/Ulcer of the Lower Extremity Etiology: Wound Location: Right, Posterior Lower Leg Secondary Arterial Insufficiency Ulcer Wounding Event: Gradually Appeared Etiology: Date Acquired: 05/06/2019 Wound Open Weeks Of Treatment: 0 Status: Clustered Wound: No Comorbid Cataracts, Chronic  Obstructive Pulmonary Disease (COPD), History: Congestive Heart Failure, Hypertension, Peripheral Arterial Disease, Peripheral Venous Disease, Type II Diabetes, Neuropathy Wound Measurements Length: (cm) 5 Width: (cm) 2.1 Depth: (cm) 0.1 Area: (cm) 8.247 Volume: (cm) 0.825 % Reduction in Area: % Reduction in Volume: Epithelialization: Small (1-33%) Tunneling: No Undermining: No Wound Description Classification: Grade 2 Wound Margin: Distinct, outline attached Exudate Amount: Medium Exudate Type: Serosanguineous Exudate Color: red, brown Foul Odor After Cleansing: No Slough/Fibrino Yes Wound Bed Granulation Amount: Large (67-100%) Exposed Structure Granulation Quality: Red Fascia Exposed: No Necrotic Amount: Small (1-33%) Fat Layer (Subcutaneous Tissue) Exposed: Yes Necrotic Quality: Adherent Slough Tendon Exposed: No Muscle Exposed: No Joint Exposed: No Bone Exposed: No Treatment Notes Wound #6 (Lower Leg) Wound Laterality: Right, Posterior Cleanser Wound Cleanser Discharge Instruction: Cleanse the wound with wound cleanser prior to applying a clean dressing using gauze sponges, not tissue or cotton balls. Peri-Wound Care Sween Lotion (Moisturizing lotion) Discharge Instruction: Apply moisturizing lotion such as eucerin cream to right leg with dressing changes Topical Primary Dressing Hydrofera Blue Classic Foam, 2x2 in Discharge Instruction: Moisten with saline prior to applying to wound bed Secondary Dressing Woven Gauze Sponge, Non-Sterile 4x4 in Discharge Instruction: Apply over primary dressing as directed. Secured With Conforming Stretch Gauze Bandage, Sterile 2x75 (in/in) Discharge Instruction: Secure with stretch gauze as directed. Compression Wrap Compression Stockings Add-Ons Electronic Signature(s) Signed: 05/05/2020 5:09:50 PM By: Shawn Stall Entered By: Shawn Stall on 05/05/2020  10:20:05 -------------------------------------------------------------------------------- Vitals Details Patient Name: Date of Service: [name]. 05/05/2020 9:00 A M Medical Record Number: [id number] Patient Account Number: [id number] Date of Birth/Sex: Treating RN: 1950-08-14 (69 y.o. Debara Pickett, Millard.Loa Primary Care Dewayne Severe: Patrick Jupiter, JULIA Other Clinician: Referring Christmas Faraci: Treating Vin Yonke/Extender: Lenda Kelp SA RGENT, JULIA Weeks in Treatment: 0 Vital Signs Time Taken: 10:08 Temperature (F): 99 Height (in): 63 Pulse (bpm): 73 Source: Stated Respiratory Rate (breaths/min): 16 Weight (lbs): 183 Blood Pressure (mmHg): 147/75 Source: Stated Reference Range: 80 - 120 mg / dl Body Mass Index (BMI): 32.4 Notes does not check blood glucose. Electronic Signature(s) Signed: 05/05/2020 5:09:50 PM By: Shawn Stall Entered By: Shawn Stall on 05/05/2020 10:16:15

## 2020-05-05 NOTE — Progress Notes (Signed)
ALBERTA, LENHARD ([id number]) Visit Report for 05/05/2020 Abuse/Suicide Risk Screen Details Patient Name: Date of Service: Patricia [name], Patricia [name] 05/05/2020 9:00 A M Medical Record Number: [id number] Patient Account Number: [id number] Date of Birth/Sex: Treating RN: [birth year] (69 y.o. Arta Silence Primary Care Braun Rocca: Romilda Garret Other Clinician: Referring Brehanna Deveny: Treating Loris Seelye/Extender: Lenda Kelp SA RGENT, JULIA Weeks in Treatment: 0 Abuse/Suicide Risk Screen Items Answer ABUSE RISK SCREEN: Has anyone close to you tried to hurt or harm you recentlyo No Do you feel uncomfortable with anyone in your familyo No Has anyone forced you do things that you didnt want to doo No Electronic Signature(s) Signed: 05/05/2020 5:09:50 PM By: Shawn Stall Entered By: Shawn Stall on 05/05/2020 10:14:31 -------------------------------------------------------------------------------- Activities of Daily Living Details Patient Name: Date of Service: Patricia, [name] 05/05/2020 9:00 A M Medical Record Number: [id number] Patient Account Number: [id number] Date of Birth/Sex: Treating RN: 1950-11-04 (69 y.o. Arta Silence Primary Care Nancyjo Givhan: Romilda Garret Other Clinician: Referring Eh Sauseda: Treating Lamondre Wesche/Extender: Lenda Kelp SA RGENT, JULIA Weeks in Treatment: 0 Activities of Daily Living Items Answer Activities of Daily Living (Please select one for each item) Drive Automobile Not Able T Medications ake Completely Able Use T elephone Completely Able Care for Appearance Completely Able Use T oilet Completely Able Bath / Shower Completely Able Dress Self Completely Able Feed Self Completely Able Walk Need Assistance Get In / Out Bed Completely Able Housework Completely Able Prepare Meals Need Assistance Handle Money Completely Able Shop for Self Need Assistance Electronic Signature(s) Signed: 05/05/2020 5:09:50 PM By: Shawn Stall Entered By: Shawn Stall on 05/05/2020 10:17:03 -------------------------------------------------------------------------------- Education Screening Details Patient Name: Date of Service: Patricia [name] 05/05/2020 9:00 A M Medical Record Number: [id number] Patient Account Number: [id number] Date of Birth/Sex: Treating RN: 1951/03/17 (69 y.o. Arta Silence Primary Care Marzelle Rutten: Romilda Garret Other Clinician: Referring Julen Rubert: Treating Halle Davlin/Extender: Lenda Kelp SA RGENT, JULIA Weeks in Treatment: 0 Primary Learner Assessed: Patient Learning Preferences/Education Level/Primary Language Learning Preference: Explanation, Demonstration, Printed Material Highest Education Level: High School Preferred Language: English Cognitive Barrier Language Barrier: No Translator Needed: No Memory Deficit: No Emotional Barrier: No Cultural/Religious Beliefs Affecting Medical Care: No Physical Barrier Impaired Vision: Yes Glasses Impaired Hearing: No Decreased Hand dexterity: No Knowledge/Comprehension Knowledge Level: High Comprehension Level: High Ability to understand written instructions: High Ability to understand verbal instructions: High Motivation Anxiety Level: Calm Cooperation: Cooperative Education Importance: Acknowledges Need Interest in Health Problems: Asks Questions Perception: Coherent Willingness to Engage in Self-Management High Activities: Readiness to Engage in Self-Management High Activities: Electronic Signature(s) Signed: 05/05/2020 5:09:50 PM By: Shawn Stall Entered By: Shawn Stall on 05/05/2020 10:17:42 -------------------------------------------------------------------------------- Fall Risk Assessment Details Patient Name: Date of Service: Patricia [name] 05/05/2020 9:00 A M Medical Record Number: [id number] Patient Account Number: [id number] Date of Birth/Sex: Treating RN: February 01, 1951 (69 y.o. Debara Pickett, Millard.Loa Primary Care Emmalee Solivan: Patrick Jupiter,  JULIA Other Clinician: Referring Cailan General: Treating Yolette Hastings/Extender: Lenda Kelp SA RGENT, JULIA Weeks in Treatment: 0 Fall Risk Assessment Items Have you had 2 or more falls in the last 12 monthso 0 No Have you had any fall that resulted in injury in the last 12 monthso 0 Yes FALLS RISK SCREEN History of falling - immediate or within 3 months 25 Yes Secondary diagnosis (Do you have 2 or more medical diagnoseso) 0 No Ambulatory aid None/bed rest/wheelchair/nurse 0 No Crutches/cane/walker 15 Yes Furniture 0 No Intravenous therapy Access/Saline/Heparin Lock 0 No Gait/Transferring Normal/ bed rest/  wheelchair 0 Yes Weak (short steps with or without shuffle, stooped but able to lift head while walking, may seek 0 No support from furniture) Impaired (short steps with shuffle, may have difficulty arising from chair, head down, impaired 0 No balance) Mental Status Oriented to own ability 0 Yes Electronic Signature(s) Signed: 05/05/2020 5:09:50 PM By: Shawn Stall Entered By: Shawn Stall on 05/05/2020 10:18:04 -------------------------------------------------------------------------------- Foot Assessment Details Patient Name: Date of Service: Patricia, [name] 05/05/2020 9:00 A M Medical Record Number: [id number] Patient Account Number: [id number] Date of Birth/Sex: Treating RN: 10/22/1950 (69 y.o. Arta Silence Primary Care Suman Trivedi: Patrick Jupiter, JULIA Other Clinician: Referring Marialy Urbanczyk: Treating Yesmin Mutch/Extender: Lenda Kelp SA RGENT, JULIA Weeks in Treatment: 0 Foot Assessment Items Site Locations + = Sensation present, - = Sensation absent, C = Callus, U = Ulcer R = Redness, W = Warmth, M = Maceration, PU = Pre-ulcerative lesion F = Fissure, S = Swelling, D = Dryness Assessment Right: Left: Other Deformity: No No Prior Foot Ulcer: No No Prior Amputation: No No Charcot Joint: No No Ambulatory Status: Ambulatory With Help Assistance Device: Walker Gait:  Steady Electronic Signature(s) Signed: 05/05/2020 5:09:50 PM By: Shawn Stall Entered By: Shawn Stall on 05/05/2020 10:18:41 -------------------------------------------------------------------------------- Nutrition Risk Screening Details Patient Name: Date of Service: KESHONA, KARTES 05/05/2020 9:00 A M Medical Record Number: [id number] Patient Account Number: [id number] Date of Birth/Sex: Treating RN: 08/16/1950 (69 y.o. Debara Pickett, Millard.Loa Primary Care Izora Benn: Patrick Jupiter, JULIA Other Clinician: Referring Alberta Cairns: Treating Anubis Fundora/Extender: Lenda Kelp SA RGENT, JULIA Weeks in Treatment: 0 Height (in): 63 Weight (lbs): 183 Body Mass Index (BMI): 32.4 Nutrition Risk Screening Items Score Screening NUTRITION RISK SCREEN: I have an illness or condition that made me change the kind and/or amount of food I eat 2 Yes I eat fewer than two meals per day 0 No I eat few fruits and vegetables, or milk products 0 No I have three or more drinks of beer, liquor or wine almost every day 0 No I have tooth or mouth problems that make it hard for me to eat 0 No I don't always have enough money to buy the food I need 0 No I eat alone most of the time 0 No I take three or more different prescribed or over-the-counter drugs a day 1 Yes Without wanting to, I have lost or gained 10 pounds in the last six months 0 No I am not always physically able to shop, cook and/or feed myself 0 No Nutrition Protocols Good Risk Protocol Provide education on elevated blood Moderate Risk Protocol 0 sugars and impact on wound healing, as applicable High Risk Proctocol Risk Level: Moderate Risk Score: 3 Electronic Signature(s) Signed: 05/05/2020 5:09:50 PM By: Shawn Stall Entered By: Shawn Stall on 05/05/2020 10:18:15

## 2020-05-05 NOTE — Progress Notes (Signed)
SARAJEAN, DESSERT ([id number]) Visit Report for 05/05/2020 Chief Complaint Document Details Patient Name: Date of Service: Patricia [name], Patricia [name] 05/05/2020 9:00 A M Medical Record Number: [id number] Patient Account Number: [id number] Date of Birth/Sex: Treating RN: 12/27/1950 (69 y.o. Tommye Standard Primary Care Provider: Romilda Garret Other Clinician: Referring Provider: Treating Provider/Extender: Lenda Kelp SA RGENT, JULIA Weeks in Treatment: 0 Information Obtained from: Patient Chief Complaint Right LE Ulcers Electronic Signature(s) Signed: 05/05/2020 11:02:11 AM By: Lenda Kelp PA-C Entered By: Lenda Kelp on 05/05/2020 11:02:11 -------------------------------------------------------------------------------- HPI Details Patient Name: Date of Service: Patricia [name], Patricia [name] 05/05/2020 9:00 A M Medical Record Number: [id number] Patient Account Number: [id number] Date of Birth/Sex: Treating RN: [birth year] (69 y.o. 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 Patient has most symptoms from the right medial calf wound with pain and discomfort. She has been putting Neosporin ointment on this wound and on the other wounds on the right and left legs. ABI in the clinic was unobtainable Patient's history significant for type 2 diabetes poorly controlled with the latest A1c of 11.8, ongoing tobacco abuse, COPD-non-O2 dependent, CHF/chronic diastolic heart  failure, Patient denies any fevers or chills, denies any other symptoms other than discomfort in the legs due to the wounds. 8/19; this is a patient who is a type II diabetic and a smoker. She was admitted to clinic last week with bilateral small punched-out painful wounds on her bilateral lower extremities and feet. Her ABIs in the clinic were 0.45 on the left and noncompressible on the right. She [place] in [place] and requires public transportation to get her back and forth. We attempted to arrange for arterial studies at the hospital in [place] but we do not have any information on this. We also ordered Santyl from [employer] she did not get that either. She has been applying a and D ointment to the wounds In speaking to the patient she is a minimal ambulator limited by both pain in her legs which may be claudication although she has apparently lumbar spinal stenosis. She tells me the maximum amount of walking she can do is 4 minutes while walking her dog 9/2; the patient finally had her arterial studies at [facility]. They were read by Dr. Rosalee Kaufman Radiology at [facility]. As it turns out she also had prior studies that I did not know about in August 2018. At that point her ABI on the right was 0.7. Noted to have calcific plaque at the common femoral bifurcation with mildly elevated systolic peak pressures focally elevated peak systolic velocities in the distal SFA and monophasic waveforms in the popliteal artery and runoff vessels. On the left her ABI was 0.5 plaque in the common femoral artery segmental occlusion of the mid SFA with tandem areas of stenosis resulting in markedly increased peak flow velocities focally in the mid segment monophasic arterial waveforms in the distal popliteal and runoff vessels. She did not have further evaluation that I am aware  of in 2018 In any case her repeat studies. They did not do a right ABI. The evaluation suggested possible distal  common femoral artery stenosis and probable diffuse disease throughout the superficial femoral artery popliteal artery and tibial arteries. There were monophasic waveforms in the superficial femoral artery on the right and left. Popliteal arteries showed monophasic waveforms bilaterally. There is no question in my mind she will require some form of angiography if not standard asked angiography then perhaps CT angiography The patient is a minimal ambulator. Her wounds are not making much progress. She could not afford the Santyl and there therefore is using Medihoney. Readmission: 05/05/2020 upon evaluation today patient appears to be doing well with regard to her wound all things considered. This is actually a reevaluation that we have not seen her for several months that she was trying to take care of this on her own at home. She did have an angiogram performed by vascular and interventional radiology specialist at [facility] imaging. Subsequently they were able to improve her blood flow in the right leg which was great news post intervention the patient seems to be doing much better. In fact she just has 1 wound remaining at this time and this wound even is showing signs of improvement. With that being said she does still need further help with getting the wound to heal. I think we can be beneficial in this regard. No fevers, chills, nausea, vomiting, or diarrhea. The patient does have diabetes mellitus type 2, hypertension, COPD, and congestive heart failure coupled with the peripheral vascular disease that is doing better after the angiogram formed on 03/25/2020 Electronic Signature(s) Signed: 05/05/2020 1:01:09 PM By: Lenda Kelp PA-C Entered By: Lenda Kelp on 05/05/2020 13:01:09 -------------------------------------------------------------------------------- Physical Exam Details Patient Name: Date of Service: Patricia [name], Patricia [name] 05/05/2020 9:00 A M Medical Record Number:  [id number] Patient Account Number: [id number] Date of Birth/Sex: Treating RN: 1950-07-13 (69 y.o. Tommye Standard Primary Care Provider: Romilda Garret Other Clinician: Referring Provider: Treating Provider/Extender: Lenda Kelp SA RGENT, JULIA Weeks in Treatment: 0 Constitutional Well-nourished and well-hydrated in no acute distress. Respiratory normal breathing without difficulty. Psychiatric this patient is able to make decisions and demonstrates good insight into disease process. Alert and Oriented x 3. pleasant and cooperative. Notes Upon inspection patient's wound did have some slough/biofilm noted on the surface of the wound actually performed mechanical debridement to remove this she did not want me to perform any sharp debridement. Post mechanical debridement however the patient seems to be doing much better which is excellent news. There is no evidence of infection at this time which is also great news. Electronic Signature(s) Signed: 05/05/2020 1:01:45 PM By: Lenda Kelp PA-C Entered By: Lenda Kelp on 05/05/2020 13:01:44 -------------------------------------------------------------------------------- Physician Orders Details Patient Name: Date of Service: Patricia [name], Patricia [name] 05/05/2020 9:00 A M Medical Record Number: [id number] Patient Account Number: [id number] Date of Birth/Sex: Treating RN: Jan 06, 1951 (69 y.o. Tommye Standard Primary Care Provider: Patrick Jupiter, JULIA Other Clinician: Referring Provider: Treating Provider/Extender: Lenda Kelp SA RGENT, JULIA Weeks in Treatment: 0 Verbal / Phone Orders: No Diagnosis Coding ICD-10 Coding Code Description E11.622 Type 2 diabetes mellitus with other skin ulcer L97.812 Non-pressure chronic ulcer of other part of right lower leg with fat layer exposed E11.42 Type 2 diabetes mellitus with diabetic polyneuropathy I73.89 Other specified peripheral vascular diseases I10 Essential (primary)  hypertension J44.9 Chronic obstructive pulmonary disease, unspecified I50.42 Chronic combined systolic (congestive) and diastolic (congestive) heart  failure Follow-up Appointments Return appointment in 3 weeks. Bathing/ Shower/ Hygiene May shower and wash wound with soap and water. Home Health New wound care orders this week; continue Home Health for wound care. May utilize formulary equivalent dressing for wound treatment orders unless otherwise specified. Other Home Health Orders/Instructions: [facility] fax [phone] Wound Treatment Wound #6 - Lower Leg Wound Laterality: Right, Posterior Cleanser: Wound Cleanser (Home Health) 3 x Per Week/30 Days Discharge Instructions: Cleanse the wound with wound cleanser prior to applying a clean dressing using gauze sponges, not tissue or cotton balls. Peri-Wound Care: Sween Lotion (Moisturizing lotion) 3 x Per Week/30 Days Discharge Instructions: Apply moisturizing lotion such as eucerin cream to right leg with dressing changes Prim Dressing: Hydrofera Blue Classic Foam, 2x2 in (Home Health) 3 x Per Week/30 Days ary Discharge Instructions: Moisten with saline prior to applying to wound bed Secondary Dressing: Woven Gauze Sponge, Non-Sterile 4x4 in (Home Health) 3 x Per Week/30 Days Discharge Instructions: Apply over primary dressing as directed. Secured With: Insurance underwriter, Sterile 2x75 (in/in) (Home Health) 3 x Per Week/30 Days Discharge Instructions: Secure with stretch gauze as directed. Electronic Signature(s) Signed: 05/05/2020 1:05:14 PM By: Lenda Kelp PA-C Signed: 05/05/2020 5:05:31 PM By: Yevonne Pax RN Entered By: Yevonne Pax on 05/05/2020 11:24:47 -------------------------------------------------------------------------------- Problem List Details Patient Name: Date of Service: Patricia [name], Patricia [name] 05/05/2020 9:00 A M Medical Record Number: [id number] Patient Account Number: [id number] Date of  Birth/Sex: Treating RN: 03-03-1951 (69 y.o. Tommye Standard Primary Care Provider: Patrick Jupiter, [place] Other Clinician: Referring Provider: Treating Provider/Extender: Lenda Kelp SA RGENT, JULIA Weeks in Treatment: 0 Active Problems ICD-10 Encounter Code Description Active Date MDM Diagnosis E11.622 Type 2 diabetes mellitus with other skin ulcer 05/05/2020 No Yes L97.812 Non-pressure chronic ulcer of other part of right lower leg with fat layer 05/05/2020 No Yes exposed E11.42 Type 2 diabetes mellitus with diabetic polyneuropathy 05/05/2020 No Yes I73.89 Other specified peripheral vascular diseases 05/05/2020 No Yes I10 Essential (primary) hypertension 05/05/2020 No Yes J44.9 Chronic obstructive pulmonary disease, unspecified 05/05/2020 No Yes I50.42 Chronic combined systolic (congestive) and diastolic (congestive) heart failure 05/05/2020 No Yes Inactive Problems Resolved Problems Electronic Signature(s) Signed: 05/05/2020 11:03:39 AM By: Lenda Kelp PA-C Previous Signature: 05/05/2020 11:01:52 AM Version By: Lenda Kelp PA-C Entered By: Lenda Kelp on 05/05/2020 11:03:39 -------------------------------------------------------------------------------- Progress Note Details Patient Name: Date of Service: Patricia [name] 05/05/2020 9:00 A M Medical Record Number: [id number] Patient Account Number: [id number] Date of Birth/Sex: Treating RN: [birth year] (69 y.o. 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 Patient has most symptoms from the right medial calf wound with pain and discomfort. She has been putting Neosporin ointment on this wound and on the other wounds on the right and left legs. ABI in the clinic was unobtainable Patient's history significant for type 2 diabetes poorly controlled with the latest A1c of 11.8, ongoing tobacco abuse, COPD-non-O2  dependent, CHF/chronic diastolic heart failure, Patient denies any fevers or chills, denies any other symptoms other than discomfort in the legs due to the wounds. 8/19; this is a patient who is a type II diabetic and a smoker. She was admitted to clinic last week with bilateral small punched-out painful wounds on her bilateral lower extremities and feet. Her ABIs in the clinic were 0.45 on the left and noncompressible on the right. She lives in [place] city and requires public transportation to get her back and forth. We attempted to arrange for arterial studies at the hospital in [place] but we do not have any information on this. We also ordered Santyl from [employer] she did not get that either. She has been applying a and D ointment to the wounds In speaking to the patient she is a minimal ambulator limited by both pain in her legs which may be claudication although she has apparently lumbar spinal stenosis. She tells me the maximum amount of walking she can do is 4 minutes while walking her dog 9/2; the patient finally had her arterial studies at [facility]. They were read by Dr. Rosalee Kaufman Radiology at [facility]. As it turns out she also had prior studies that I did not know about in August 2018. At that point her ABI on the right was 0.7. Noted to have calcific plaque at the common femoral bifurcation with mildly elevated systolic peak pressures focally elevated peak systolic velocities in the distal  SFA and monophasic waveforms in the popliteal artery and runoff vessels. On the left her ABI was 0.5 plaque in the common femoral artery segmental occlusion of the mid SFA with tandem areas of stenosis resulting in markedly increased peak flow velocities focally in the mid segment monophasic arterial waveforms in the distal popliteal and runoff vessels. She did not have further evaluation that I am aware of in 2018 In any case her repeat studies. They did not do a right ABI. The evaluation suggested possible distal common femoral artery stenosis and probable diffuse disease throughout the superficial femoral artery popliteal artery and tibial arteries. There were monophasic waveforms in the superficial femoral artery on the right and left. Popliteal arteries showed monophasic waveforms bilaterally. There is no question in my mind she will require some form of angiography if not standard asked angiography then perhaps CT angiography The patient is a minimal ambulator. Her wounds are not making much progress. She could not afford the Santyl and there therefore is using Medihoney. Readmission: 05/05/2020 upon evaluation today patient appears to be doing well with regard to her wound all things considered. This is actually a reevaluation that we have not seen her for several months that she was trying to take care of this on her own at home. She did have an angiogram performed by vascular and interventional radiology specialist at [facility] imaging. Subsequently they were able to improve her blood flow in the right leg which was great news post intervention the patient seems to be doing much better. In fact she just has 1 wound remaining at this time and this wound even is showing signs of improvement. With that being said she does still need further help with getting the wound to heal. I think we can be beneficial in this regard. No fevers, chills, nausea, vomiting, or diarrhea. The patient does have  diabetes mellitus type 2, hypertension, COPD, and congestive heart failure coupled with the peripheral vascular disease that is doing better after the angiogram formed on  03/25/2020 Patient History Information obtained from Patient. Allergies Panlor (hydrocodone-acetamin) (Reaction: itching) Family History Cancer - Mother,Siblings,Maternal Grandparents, Diabetes - Mother,Maternal Grandparents, Heart Disease - Father,Paternal Grandparents, Hypertension - Father,Paternal Grandparents, Kidney Disease - Siblings, Lung Disease - Siblings,Father,Paternal Grandparents, Stroke - Father,Mother,Paternal Grandparents,Maternal Grandparents, No family history of Hereditary Spherocytosis, Seizures, Thyroid Problems, Tuberculosis. Social History Current every day smoker, Marital Status - Divorced, Alcohol Use - Never, Drug Use - Prior History, Caffeine Use - Daily. Medical History Eyes Patient has history of Cataracts - cataracts Denies history of Glaucoma, Optic Neuritis Ear/Nose/Mouth/Throat Denies history of Chronic sinus problems/congestion, Middle ear problems Hematologic/Lymphatic Denies history of Anemia, Hemophilia, Human Immunodeficiency Virus, Lymphedema, Sickle Cell Disease Respiratory Patient has history of Chronic Obstructive Pulmonary Disease (COPD) Denies history of Aspiration, Asthma, Pneumothorax, Sleep Apnea, Tuberculosis Cardiovascular Patient has history of Congestive Heart Failure, Hypertension, Peripheral Arterial Disease - CT angiogram 03/08/2020, Peripheral Venous Disease Denies history of Angina, Arrhythmia, Coronary Artery Disease, Deep Vein Thrombosis, Hypotension, Myocardial Infarction, Phlebitis, Vasculitis Gastrointestinal Denies history of Cirrhosis , Colitis, Crohnoos, Hepatitis A, Hepatitis B, Hepatitis C Endocrine Patient has history of Type II Diabetes Denies history of Type I Diabetes Genitourinary Denies history of End Stage Renal Disease Immunological Denies  history of Lupus Erythematosus, Raynaudoos, Scleroderma Integumentary (Skin) Denies history of History of Burn Musculoskeletal Denies history of Gout, Rheumatoid Arthritis, Osteoarthritis, Osteomyelitis Neurologic Patient has history of Neuropathy - hands and feet Denies history of Dementia, Quadriplegia, Paraplegia, Seizure Disorder Oncologic Denies history of Received Chemotherapy, Received Radiation Psychiatric Denies history of Anorexia/bulimia, Confinement Anxiety Patient is treated with Oral Agents. Blood sugar is tested. Hospitalization/Surgery History - CT angiogram 03/08/2020. Medical A Surgical History Notes nd Musculoskeletal degenerative disc disease Review of Systems (ROS) Constitutional Symptoms (General Health) Denies complaints or symptoms of Fatigue, Fever, Chills, Marked Weight Change. Eyes Complains or has symptoms of Glasses / Contacts - glasses. Denies complaints or symptoms of Dry Eyes, Vision Changes. Ear/Nose/Mouth/Throat Denies complaints or symptoms of Chronic sinus problems or rhinitis. Respiratory Denies complaints or symptoms of Chronic or frequent coughs, Shortness of Breath. Cardiovascular Denies complaints or symptoms of Chest pain. Gastrointestinal Denies complaints or symptoms of Frequent diarrhea, Nausea, Vomiting. Genitourinary Denies complaints or symptoms of Frequent urination. Integumentary (Skin) Complains or has symptoms of Wounds - currently right leg. Musculoskeletal Denies complaints or symptoms of Muscle Pain, Muscle Weakness. Neurologic Denies complaints or symptoms of Numbness/parasthesias. Psychiatric Denies complaints or symptoms of Claustrophobia, Suicidal. Objective Constitutional Well-nourished and well-hydrated in no acute distress. Vitals Time Taken: 10:08 AM, Height: 63 in, Source: Stated, Weight: 183 lbs, Source: Stated, BMI: 32.4, Temperature: 99 F, Pulse: 73 bpm, Respiratory Rate: 16 breaths/min, Blood  Pressure: 147/75 mmHg. General Notes: does not check blood glucose. Respiratory normal breathing without difficulty. Psychiatric this patient is able to make decisions and demonstrates good insight into disease process. Alert and Oriented x 3. pleasant and cooperative. General Notes: Upon inspection patient's wound did have some slough/biofilm noted on the surface of the wound actually performed mechanical debridement to remove this she did not want me to perform any sharp debridement. Post mechanical debridement however the patient seems to be doing much better which is excellent news. There is no evidence of infection at this time which is also great news. Integumentary (Hair, Skin) Wound #6 status is Open. Original cause of wound was Gradually Appeared. The wound is located on the Right,Posterior Lower Leg. The wound measures 5cm length x 2.1cm width x 0.1cm depth; 8.247cm^2 area and 0.825cm^3 volume. There is  Fat Layer (Subcutaneous Tissue) exposed. There is no tunneling or undermining noted. There is a medium amount of serosanguineous drainage noted. The wound margin is distinct with the outline attached to the wound base. There is large (67-100%) red granulation within the wound bed. There is a small (1-33%) amount of necrotic tissue within the wound bed including Adherent Slough. Assessment Active Problems ICD-10 Type 2 diabetes mellitus with other skin ulcer Non-pressure chronic ulcer of other part of right lower leg with fat layer exposed Type 2 diabetes mellitus with diabetic polyneuropathy Other specified peripheral vascular diseases Essential (primary) hypertension Chronic obstructive pulmonary disease, unspecified Chronic combined systolic (congestive) and diastolic (congestive) heart failure Plan Follow-up Appointments: Return appointment in 3 weeks. Bathing/ Shower/ Hygiene: May shower and wash wound with soap and water. Home Health: New wound care orders this week;  continue Home Health for wound care. May utilize formulary equivalent dressing for wound treatment orders unless otherwise specified. Other Home Health Orders/Instructions: [facility] fax [phone] WOUND #6: - Lower Leg Wound Laterality: Right, Posterior Cleanser: Wound Cleanser (Home Health) 3 x Per Week/30 Days Discharge Instructions: Cleanse the wound with wound cleanser prior to applying a clean dressing using gauze sponges, not tissue or cotton balls. Peri-Wound Care: Sween Lotion (Moisturizing lotion) 3 x Per Week/30 Days Discharge Instructions: Apply moisturizing lotion such as eucerin cream to right leg with dressing changes Prim Dressing: Hydrofera Blue Classic Foam, 2x2 in (Home Health) 3 x Per Week/30 Days ary Discharge Instructions: Moisten with saline prior to applying to wound bed Secondary Dressing: Woven Gauze Sponge, Non-Sterile 4x4 in (Home Health) 3 x Per Week/30 Days Discharge Instructions: Apply over primary dressing as directed. Secured With: Insurance underwriter, Sterile 2x75 (in/in) (Home Health) 3 x Per Week/30 Days Discharge Instructions: Secure with stretch gauze as directed. 1. Would recommend currently that we have the patient going continue with the current use of AandE ointment around the edges of the wound I think that is perfectly fine. With that being said over the wound itself I would recommend Hydrofera Blue I think this will help with some of the hyper granulation I believe it would also help clean up some of the surface of the wound as well. 2. Muscle can recommend at this point that the patient continue with 4 x 4 gauze to cover and then subsequently roll gauze to secure in place. I think that is an appropriate way to manage this currently the patient is in agreement with plan. 3. I would also recommend she continue to elevate her legs if she has any significant edema that should help in that regard. Right now it is not really too swollen so  that is at least good news as well. We will see patient back for reevaluation in 3 weeks here in the clinic. If anything worsens or changes patient will contact our office for additional recommendations. Electronic Signature(s) Signed: 05/05/2020 1:02:31 PM By: Lenda Kelp PA-C Entered By: Lenda Kelp on 05/05/2020 13:02:31 -------------------------------------------------------------------------------- HxROS Details Patient Name: Date of Service: Patricia [name], Patricia [name] 05/05/2020 9:00 A M Medical Record Number: [id number] Patient Account Number: [id number] Date of Birth/Sex: Treating RN: Apr 17, 1951 (69 y.o. Arta Silence Primary Care Provider: Romilda Garret Other Clinician: Referring Provider: Treating Provider/Extender: Lenda Kelp SA RGENT, JULIA Weeks in Treatment: 0 Information Obtained From Patient Constitutional Symptoms (General Health) Complaints and Symptoms: Negative for: Fatigue; Fever; Chills; Marked Weight Change Eyes Complaints and Symptoms: Positive for: Glasses / Contacts - glasses  Negative for: Dry Eyes; Vision Changes Medical History: Positive for: Cataracts - cataracts Negative for: Glaucoma; Optic Neuritis Ear/Nose/Mouth/Throat Complaints and Symptoms: Negative for: Chronic sinus problems or rhinitis Medical History: Negative for: Chronic sinus problems/congestion; Middle ear problems Respiratory Complaints and Symptoms: Negative for: Chronic or frequent coughs; Shortness of Breath Medical History: Positive for: Chronic Obstructive Pulmonary Disease (COPD) Negative for: Aspiration; Asthma; Pneumothorax; Sleep Apnea; Tuberculosis Cardiovascular Complaints and Symptoms: Negative for: Chest pain Medical History: Positive for: Congestive Heart Failure; Hypertension; Peripheral Arterial Disease - CT angiogram 03/08/2020; Peripheral Venous Disease Negative for: Angina; Arrhythmia; Coronary Artery Disease; Deep Vein Thrombosis; Hypotension;  Myocardial Infarction; Phlebitis; Vasculitis Gastrointestinal Complaints and Symptoms: Negative for: Frequent diarrhea; Nausea; Vomiting Medical History: Negative for: Cirrhosis ; Colitis; Crohns; Hepatitis A; Hepatitis B; Hepatitis C Genitourinary Complaints and Symptoms: Negative for: Frequent urination Medical History: Negative for: End Stage Renal Disease Integumentary (Skin) Complaints and Symptoms: Positive for: Wounds - currently right leg Medical History: Negative for: History of Burn Musculoskeletal Complaints and Symptoms: Negative for: Muscle Pain; Muscle Weakness Medical History: Negative for: Gout; Rheumatoid Arthritis; Osteoarthritis; Osteomyelitis Past Medical History Notes: degenerative disc disease Neurologic Complaints and Symptoms: Negative for: Numbness/parasthesias Medical History: Positive for: Neuropathy - hands and feet Negative for: Dementia; Quadriplegia; Paraplegia; Seizure Disorder Psychiatric Complaints and Symptoms: Negative for: Claustrophobia; Suicidal Medical History: Negative for: Anorexia/bulimia; Confinement Anxiety Hematologic/Lymphatic Medical History: Negative for: Anemia; Hemophilia; Human Immunodeficiency Virus; Lymphedema; Sickle Cell Disease Endocrine Medical History: Positive for: Type II Diabetes Negative for: Type I Diabetes Time with diabetes: 2003 Treated with: Oral agents Blood sugar tested every day: Yes Tested : Immunological Medical History: Negative for: Lupus Erythematosus; Raynauds; Scleroderma Oncologic Medical History: Negative for: Received Chemotherapy; Received Radiation HBO Extended History Items Eyes: Cataracts Immunizations Pneumococcal Vaccine: Received Pneumococcal Vaccination: Yes Implantable Devices Yes Hospitalization / Surgery History Type of Hospitalization/Surgery CT angiogram 03/08/2020 Family and Social History Cancer: Yes - Mother,Siblings,Maternal Grandparents; Diabetes: Yes -  Mother,Maternal Grandparents; Heart Disease: Yes - Father,Paternal Grandparents; Hereditary Spherocytosis: No; Hypertension: Yes - Father,Paternal Grandparents; Kidney Disease: Yes - Siblings; Lung Disease: Yes - Siblings,Father,Paternal Grandparents; Seizures: No; Stroke: Yes - Father,Mother,Paternal Grandparents,Maternal Grandparents; Thyroid Problems: No; Tuberculosis: No; Current every day smoker; Marital Status - Divorced; Alcohol Use: Never; Drug Use: Prior History; Caffeine Use: Daily; Financial Concerns: Yes; Food, Clothing or Shelter Needs: No; Support System Lacking: No; Transportation Concerns: No Electronic Signature(s) Signed: 05/05/2020 1:05:14 PM By: Lenda Kelp PA-C Signed: 05/05/2020 5:09:50 PM By: Shawn Stall Entered By: Shawn Stall on 05/05/2020 10:14:18 -------------------------------------------------------------------------------- SuperBill Details Patient Name: Date of Service: Patricia [name], Patricia [name] 05/05/2020 Medical Record Number: [id number] Patient Account Number: [id number] Date of Birth/Sex: Treating RN: 09-22-1950 (69 y.o. 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 I73.89 Other specified peripheral vascular diseases I10 Essential (primary) hypertension J44.9 Chronic obstructive pulmonary disease, unspecified I50.42 Chronic combined systolic (congestive) and diastolic (congestive) heart failure Facility Procedures CPT4 Code: [id number] Description: 99214 - WOUND CARE VISIT-LEV 4 EST PT Modifier: Quantity: 1 Physician Procedures : CPT4 Code Description Modifier [id number]  [id number] - WC PHYS LEVEL 3 - EST PT ICD-10 Diagnosis Description E11.622 Type 2 diabetes mellitus with other skin ulcer L97.812 Non-pressure chronic  ulcer of other part of right lower leg with fat layer exposed  I73.89 Other specified peripheral vascular diseases E11.42 Type 2 diabetes mellitus with diabetic polyneuropathy Quantity: 1 Electronic Signature(s) Signed: 05/05/2020 1:02:58 PM By: Lenda Kelp PA-C Entered By: Lenda Kelp on 05/05/2020 13:02:58

## 2020-05-26 ENCOUNTER — Encounter (HOSPITAL_BASED_OUTPATIENT_CLINIC_OR_DEPARTMENT_OTHER): Payer: Medicare Other | Attending: Physician Assistant | Admitting: Physician Assistant

## 2020-05-26 ENCOUNTER — Other Ambulatory Visit: Payer: Self-pay

## 2020-05-26 DIAGNOSIS — E1151 Type 2 diabetes mellitus with diabetic peripheral angiopathy without gangrene: Secondary | ICD-10-CM | POA: Diagnosis not present

## 2020-05-26 DIAGNOSIS — L97812 Non-pressure chronic ulcer of other part of right lower leg with fat layer exposed: Secondary | ICD-10-CM | POA: Insufficient documentation

## 2020-05-26 DIAGNOSIS — E1142 Type 2 diabetes mellitus with diabetic polyneuropathy: Secondary | ICD-10-CM | POA: Insufficient documentation

## 2020-05-26 DIAGNOSIS — I5042 Chronic combined systolic (congestive) and diastolic (congestive) heart failure: Secondary | ICD-10-CM | POA: Insufficient documentation

## 2020-05-26 DIAGNOSIS — I110 Hypertensive heart disease with heart failure: Secondary | ICD-10-CM | POA: Diagnosis not present

## 2020-05-26 DIAGNOSIS — E11622 Type 2 diabetes mellitus with other skin ulcer: Secondary | ICD-10-CM | POA: Insufficient documentation

## 2020-05-26 DIAGNOSIS — J449 Chronic obstructive pulmonary disease, unspecified: Secondary | ICD-10-CM | POA: Insufficient documentation

## 2020-05-26 DIAGNOSIS — F17200 Nicotine dependence, unspecified, uncomplicated: Secondary | ICD-10-CM | POA: Insufficient documentation

## 2020-05-26 NOTE — Progress Notes (Addendum)
Patricia [name] ([id number]) Visit Report for 05/26/2020 Chief Complaint Document Details Patient Name: Date of Service: Patricia [name], Patricia [name] 05/26/2020 1:00 PM Medical Record Number: [id number] Patient Account Number: [id number] Date of Birth/Sex: Treating RN: [birth year] (70 y.o. Wynelle Link Primary Care Provider: Williemae Natter Other Clinician: Referring Provider: Treating Provider/Extender: Erma Heritage in Treatment: 3 Information Obtained from: Patient Chief Complaint Right LE Ulcers Electronic Signature(s) Signed: 05/26/2020 2:18:08 PM By: Lenda Kelp PA-C Entered By: Lenda Kelp on 05/26/2020 14:18:08 -------------------------------------------------------------------------------- HPI Details Patient Name: Date of Service: Patricia [name], Patricia [name] 05/26/2020 1:00 PM Medical Record Number: [id number] Patient Account Number: [id number] Date of Birth/Sex: Treating RN: 03/02/1951 (70 y.o. 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 Patient has most symptoms from the right medial calf wound with pain and discomfort. She has been putting Neosporin ointment on this wound and on the other wounds on the right and left legs. ABI in the clinic was unobtainable Patient's history significant for type 2 diabetes poorly controlled with the latest A1c of 11.8, ongoing tobacco abuse, COPD-non-O2 dependent, CHF/chronic diastolic heart failure, Patient denies  any fevers or chills, denies any other symptoms other than discomfort in the legs due to the wounds. 8/19; this is a patient who is a type II diabetic and a smoker. She was admitted to clinic last week with bilateral small punched-out painful wounds on her bilateral lower extremities and feet. Her ABIs in the clinic were 0.45 on the left and noncompressible on the right. She lives in [place] and requires public transportation to get her back and forth. We attempted to arrange for arterial studies at the hospital in [place] but we do not have any information on this. We also ordered Santyl from [employer] she did not get that either. She has been applying a and D ointment to the wounds In speaking to the patient she is a minimal ambulator limited by both pain in her legs which may be claudication although she has apparently lumbar spinal stenosis. She tells me the maximum amount of walking she can do is 4 minutes while walking her dog 9/2; the patient finally had her arterial studies at [facility]. They were read by Dr. Rosalee Kaufman Radiology at [facility] As it turns out she also had prior studies that I did not know about in August 2018. At that point her ABI on the right was 0.7. Noted to have calcific plaque at the common femoral bifurcation with mildly elevated systolic peak pressures focally elevated peak systolic velocities in the distal SFA and monophasic waveforms in the popliteal artery and runoff vessels. On the left her ABI was 0.5 plaque in the common femoral artery segmental occlusion of the mid SFA with tandem areas of stenosis resulting in markedly increased peak flow velocities focally in the mid segment monophasic arterial waveforms in the distal popliteal and runoff vessels. She did not have further evaluation that I am aware of in 2018 In any case  her repeat studies. They did not do a right ABI. The evaluation suggested possible distal common femoral artery  stenosis and probable diffuse disease throughout the superficial femoral artery popliteal artery and tibial arteries. There were monophasic waveforms in the superficial femoral artery on the right and left. Popliteal arteries showed monophasic waveforms bilaterally. There is no question in my mind she will require some form of angiography if not standard asked angiography then perhaps CT angiography The patient is a minimal ambulator. Her wounds are not making much progress. She could not afford the Santyl and there therefore is using Medihoney. Readmission: 05/05/2020 upon evaluation today patient appears to be doing well with regard to her wound all things considered. This is actually a reevaluation that we have not seen her for several months that she was trying to take care of this on her own at home. She did have an angiogram performed by vascular and interventional radiology specialist at [facility]. Subsequently they were able to improve her blood flow in the right leg which was great news post intervention the patient seems to be doing much better. In fact she just has 1 wound remaining at this time and this wound even is showing signs of improvement. With that being said she does still need further help with getting the wound to heal. I think we can be beneficial in this regard. No fevers, chills, nausea, vomiting, or diarrhea. The patient does have diabetes mellitus type 2, hypertension, COPD, and congestive heart failure coupled with the peripheral vascular disease that is doing better after the angiogram formed on 03/25/2020 05/26/20 on evaluation today patient actually appears to be doing better in regard to her wound this is measuring smaller that is great news. With that being said there does not appear to be any signs of of active infection at this time. No fevers, chills, nausea, vomiting, or diarrhea. With that being said the patient does tell me that she is having some  irritation around the wound I definitely see this I think this is due to adhesive. When we just used a roll gauze she tells me this does not stay up. Electronic Signature(s) Signed: 05/26/2020 2:30:34 PM By: Lenda Kelp PA-C Entered By: Lenda Kelp on 05/26/2020 14:30:34 -------------------------------------------------------------------------------- Physical Exam Details Patient Name: Date of Service: Patricia [name], Patricia [name] 05/26/2020 1:00 PM Medical Record Number: [id number] Patient Account Number: [id number] Date of Birth/Sex: Treating RN: 02-Aug-1950 (70 y.o. Wynelle Link Primary Care Provider: Williemae Natter Other Clinician: Referring Provider: Treating Provider/Extender: Erma Heritage in Treatment: 3 Constitutional Well-nourished and well-hydrated in no acute distress. Respiratory normal breathing without difficulty. Psychiatric this patient is able to make decisions and demonstrates good insight into disease process. Alert and Oriented x 3. pleasant and cooperative. Notes Upon inspection patient's wound bed showed signs of good granulation minimal slough noted I was able to clean this off which is saline and gauze no sharp debridement was really necessary which is great news. However I do believe that she would benefit from a compression wrap she is very tense as far as fluid in the leg and I think getting edema under control would really help her out. Electronic Signature(s) Signed: 05/26/2020 2:30:54 PM By: Lenda Kelp PA-C Entered By: Lenda Kelp on 05/26/2020 14:30:54 -------------------------------------------------------------------------------- Physician Orders Details Patient Name: Date of Service: Patricia [name], Patricia [name] 05/26/2020 1:00 PM Medical Record Number: [id number] Patient Account Number: [id number] Date of Birth/Sex: Treating RN: 02/11/1951 (70 y.o. F) Burnadette Peter,  [place] Primary Care Provider: Williemae Natter Other Clinician: Referring  Provider: Treating Provider/Extender: Erma Heritage in Treatment: 3 Verbal / Phone Orders: No Diagnosis Coding ICD-10 Coding Code Description E11.622 Type 2 diabetes mellitus with other skin ulcer L97.812 Non-pressure chronic ulcer of other part of right lower leg with fat layer exposed E11.42 Type 2 diabetes mellitus with diabetic polyneuropathy I73.89 Other specified peripheral vascular diseases I10 Essential (primary) hypertension J44.9 Chronic obstructive pulmonary disease, unspecified I50.42 Chronic combined systolic (congestive) and diastolic (congestive) heart failure Follow-up Appointments Return Appointment in 2 weeks. Bathing/ Shower/ Hygiene May shower and wash wound with soap and water. Home Health New wound care orders this week; continue Home Health for wound care. May utilize formulary equivalent dressing for wound treatment orders unless otherwise specified. Other Home Health Orders/Instructions: [facility] fax [phone] Wound Treatment Wound #6 - Lower Leg Wound Laterality: Right, Posterior Cleanser: Wound Cleanser (Home Health) 2 x Per Week/30 Days Discharge Instructions: Cleanse the wound with wound cleanser prior to applying a clean dressing using gauze sponges, not tissue or cotton balls. Peri-Wound Care: Sween Lotion (Moisturizing lotion) 2 x Per Week/30 Days Discharge Instructions: Apply moisturizing lotion such as eucerin cream to right leg with dressing changes Prim Dressing: Hydrofera Blue Classic Foam, 2x2 in (Home Health) 2 x Per Week/30 Days ary Discharge Instructions: Moisten with saline prior to applying to wound bed Secondary Dressing: Woven Gauze Sponge, Non-Sterile 4x4 in (Home Health) 2 x Per Week/30 Days Discharge Instructions: Apply over primary dressing as directed. Secondary Dressing: ABD Pad, 5x9 (Home Health) 2 x Per Week/30 Days Discharge Instructions: Apply over primary dressing as directed. Compression Wrap:  ThreePress (3 layer compression wrap) (Home Health) 2 x Per Week/30 Days Discharge Instructions: Apply three layer compression as directed. Electronic Signature(s) Signed: 05/26/2020 4:44:38 PM By: Lenda Kelp PA-C Signed: 05/26/2020 5:18:45 PM By: Zandra Abts RN, BSN Entered By: Zandra Abts on 05/26/2020 14:28:14 -------------------------------------------------------------------------------- Problem List Details Patient Name: Date of Service: Patricia [name], Patricia [name] 05/26/2020 1:00 PM Medical Record Number: [id number] Patient Account Number: [id number] Date of Birth/Sex: Treating RN: Apr 20, 1951 (70 y.o. Wynelle Link Primary Care Provider: Williemae Natter Other Clinician: Referring Provider: Treating Provider/Extender: Erma Heritage in Treatment: 3 Active Problems ICD-10 Encounter Code Description Active Date MDM Diagnosis E11.622 Type 2 diabetes mellitus with other skin ulcer 05/05/2020 No Yes L97.812 Non-pressure chronic ulcer of other part of right lower leg with fat layer 05/05/2020 No Yes exposed E11.42 Type 2 diabetes mellitus with diabetic polyneuropathy 05/05/2020 No Yes I73.89 Other specified peripheral vascular diseases 05/05/2020 No Yes I10 Essential (primary) hypertension 05/05/2020 No Yes J44.9 Chronic obstructive pulmonary disease, unspecified 05/05/2020 No Yes I50.42 Chronic combined systolic (congestive) and diastolic (congestive) heart failure 05/05/2020 No Yes Inactive Problems Resolved Problems Electronic Signature(s) Signed: 05/26/2020 2:18:00 PM By: Lenda Kelp PA-C Entered By: Lenda Kelp on 05/26/2020 14:18:00 -------------------------------------------------------------------------------- Progress Note Details Patient Name: Date of Service: Patricia [name], Patricia [name] 05/26/2020 1:00 PM Medical Record Number: [id number] Patient Account Number: [id number] Date of Birth/Sex: Treating RN: Oct 01, 1950 (70 y.o. 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 Patient has most symptoms from the right medial calf wound with pain and discomfort. She has been putting Neosporin ointment on this wound and on the other wounds on the right and left legs. ABI in the clinic was unobtainable Patient's history significant for type 2 diabetes poorly controlled with the latest A1c of 11.8, ongoing tobacco abuse, COPD-non-O2 dependent, CHF/chronic diastolic heart failure, Patient denies any fevers or chills, denies any other symptoms other than discomfort in the legs due to the wounds. 8/19; this is a patient who is a type II diabetic and a smoker. She was admitted to clinic last week with bilateral small punched-out painful wounds on her bilateral lower extremities and feet. Her ABIs in the clinic were 0.45 on the left and noncompressible on the right. She lives in [place] and requires public transportation to get her back and forth. We attempted to arrange for arterial studies at the hospital in [place] city but we do not have any information on this. We also ordered Santyl from [employer] she did not get that either. She has been applying a and D ointment to the wounds In speaking to the patient she is a minimal ambulator limited by both pain in her legs which may be claudication although she has apparently lumbar spinal stenosis. She tells me the  maximum amount of walking she can do is 4 minutes while walking her dog 9/2; the patient finally had her arterial studies at [facility]. They were read by Dr. Rosalee Kaufman Radiology at [facility]. As it turns out she also had prior studies that I did not know about in August 2018. At that point her ABI on the right was 0.7. Noted to have calcific plaque at the common femoral bifurcation with mildly elevated systolic peak pressures focally elevated peak systolic velocities in the distal SFA and monophasic waveforms in the popliteal artery and runoff vessels. On the left her ABI was 0.5 plaque in the common femoral artery segmental occlusion of the mid SFA with tandem areas of stenosis resulting in markedly increased peak flow velocities focally in the mid segment monophasic arterial waveforms in the distal popliteal and runoff vessels. She did not have further evaluation that I am aware of in 2018 In any case her repeat studies. They did not do a right ABI. The evaluation suggested possible distal common femoral artery stenosis and probable diffuse disease throughout the superficial femoral artery popliteal artery and tibial arteries. There were monophasic waveforms in the superficial femoral artery on the right and left. Popliteal arteries showed monophasic waveforms bilaterally. There is no question in my mind she will require some form of angiography if not standard asked angiography then perhaps CT angiography The patient is a minimal ambulator. Her wounds are not making much progress. She could not afford the Santyl and there therefore is using Medihoney. Readmission: 05/05/2020 upon evaluation today patient appears to be doing well with regard to her wound all things considered. This is actually a reevaluation that we have not seen her for several months that she was trying to take care of this on her own at home. She did have an angiogram performed by vascular and interventional radiology  specialist at [facility]. Subsequently they were able to improve her blood flow in the right leg which was great  news post intervention the patient seems to be doing much better. In fact she just has 1 wound remaining at this time and this wound even is showing signs of improvement. With that being said she does still need further help with getting the wound to heal. I think we can be beneficial in this regard. No fevers, chills, nausea, vomiting, or diarrhea. The patient does have diabetes mellitus type 2, hypertension, COPD, and congestive heart failure coupled with the peripheral vascular disease that is doing better after the angiogram formed on 03/25/2020 05/26/20 on evaluation today patient actually appears to be doing better in regard to her wound this is measuring smaller that is great news. With that being said there does not appear to be any signs of of active infection at this time. No fevers, chills, nausea, vomiting, or diarrhea. With that being said the patient does tell me that she is having some irritation around the wound I definitely see this I think this is due to adhesive. When we just used a roll gauze she tells me this does not stay up. Objective Constitutional Well-nourished and well-hydrated in no acute distress. Vitals Time Taken: 1:54 PM, Height: 63 in, Weight: 183 lbs, BMI: 32.4, Temperature: 98 F, Pulse: 74 bpm, Respiratory Rate: 18 breaths/min, Blood Pressure: 147/73 mmHg. Respiratory normal breathing without difficulty. Psychiatric this patient is able to make decisions and demonstrates good insight into disease process. Alert and Oriented x 3. pleasant and cooperative. General Notes: Upon inspection patient's wound bed showed signs of good granulation minimal slough noted I was able to clean this off which is saline and gauze no sharp debridement was really necessary which is great news. However I do believe that she would benefit from a compression wrap she  is very tense as far as fluid in the leg and I think getting edema under control would really help her out. Integumentary (Hair, Skin) Wound #6 status is Open. Original cause of wound was Gradually Appeared. The wound is located on the Right,Posterior Lower Leg. The wound measures 4cm length x 2cm width x 0.1cm depth; 6.283cm^2 area and 0.628cm^3 volume. There is Fat Layer (Subcutaneous Tissue) exposed. There is no tunneling or undermining noted. There is a medium amount of serosanguineous drainage noted. The wound margin is distinct with the outline attached to the wound base. There is large (67-100%) red granulation within the wound bed. There is a small (1-33%) amount of necrotic tissue within the wound bed including Adherent Slough. Assessment Active Problems ICD-10 Type 2 diabetes mellitus with other skin ulcer Non-pressure chronic ulcer of other part of right lower leg with fat layer exposed Type 2 diabetes mellitus with diabetic polyneuropathy Other specified peripheral vascular diseases Essential (primary) hypertension Chronic obstructive pulmonary disease, unspecified Chronic combined systolic (congestive) and diastolic (congestive) heart failure Procedures Wound #6 Pre-procedure diagnosis of Wound #6 is a Diabetic Wound/Ulcer of the Lower Extremity located on the Right,Posterior Lower Leg . There was a Three Layer Compression Therapy Procedure by Zandra Abts, RN. Post procedure Diagnosis Wound #6: Same as Pre-Procedure Plan Follow-up Appointments: Return Appointment in 2 weeks. Bathing/ Shower/ Hygiene: May shower and wash wound with soap and water. Home Health: New wound care orders this week; continue Home Health for wound care. May utilize formulary equivalent dressing for wound treatment orders unless otherwise specified. Other Home Health Orders/Instructions: [facility] fax [phone] WOUND #6: - Lower Leg Wound Laterality: Right, Posterior Cleanser: Wound Cleanser  (Home Health) 2 x Per Week/30 Days Discharge Instructions:  Cleanse the wound with wound cleanser prior to applying a clean dressing using gauze sponges, not tissue or cotton balls. Peri-Wound Care: Sween Lotion (Moisturizing lotion) 2 x Per Week/30 Days Discharge Instructions: Apply moisturizing lotion such as eucerin cream to right leg with dressing changes Prim Dressing: Hydrofera Blue Classic Foam, 2x2 in (Home Health) 2 x Per Week/30 Days ary Discharge Instructions: Moisten with saline prior to applying to wound bed Secondary Dressing: Woven Gauze Sponge, Non-Sterile 4x4 in (Home Health) 2 x Per Week/30 Days Discharge Instructions: Apply over primary dressing as directed. Secondary Dressing: ABD Pad, 5x9 (Home Health) 2 x Per Week/30 Days Discharge Instructions: Apply over primary dressing as directed. Com pression Wrap: ThreePress (3 layer compression wrap) (Home Health) 2 x Per Week/30 Days Discharge Instructions: Apply three layer compression as directed. 1. Would recommend currently that we go ahead and continue with the [facility] as that seems to be doing well. 2. I am also can recommend that we have the patient continue with the ABD pads to cover. 3. We will also initiate treatment with a 3 layer compression wrap I think this would be better to control her edema which should in the long run help her help with the tightness of her leg as well. 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 We will see patient back for reevaluation in 2 weeks here in the clinic. If anything worsens or changes patient will contact our office for additional recommendations. Electronic Signature(s) Signed: 05/26/2020 2:31:56 PM By: Worthy Keeler PA-C Entered By: Worthy Keeler on 05/26/2020 14:31:56 -------------------------------------------------------------------------------- SuperBill Details Patient Name: Date of Service: Patricia [name], Patricia [name] 05/26/2020 Medical  Record Number: [id number] Patient Account Number: [id number] Date of Birth/Sex: Treating RN: 07-06-1950 (70 y.o. Nancy Fetter Primary Care Provider: Esperanza Sheets Other Clinician: Referring Provider: Treating Provider/Extender: Risa Grill in Treatment: 3 Diagnosis Coding ICD-10 Codes Code Description [phone] Type 2 diabetes mellitus with other skin ulcer L97.812 Non-pressure chronic ulcer of other part of right lower leg with fat layer exposed E11.42 Type 2 diabetes mellitus with diabetic polyneuropathy I73.89 Other specified peripheral vascular diseases I10 Essential (primary) hypertension J44.9 Chronic obstructive pulmonary disease, unspecified I50.42 Chronic combined systolic (congestive) and diastolic (congestive) heart failure Facility Procedures CPT4 Code: [id number] Description: (Facility Use Only) [phone] - [facility] L[facility] RT LEG Modifier: Quantity: 1 Physician Procedures : CPT4 Code Description Modifier [id number] - WC PHYS LEVEL 3 - EST PT ICD-10 Diagnosis Description E11.622 Type 2 diabetes mellitus with other skin ulcer E11.42 Type 2 diabetes mellitus with diabetic polyneuropathy L97.812 Non-pressure chronic ulcer  of other part of right lower leg with fat layer exposed I73.89 Other specified peripheral vascular diseases Quantity: 1 Electronic Signature(s) Signed: 05/26/2020 2:32:09 PM By: Worthy Keeler PA-C Entered By: Worthy Keeler on 05/26/2020 14:32:08

## 2020-05-28 NOTE — Progress Notes (Signed)
RAYMIE, TRANI ([id number]) Visit Report for 05/26/2020 Arrival Information Details Patient Name: Date of Service: Patricia [name], Patricia [name] 05/26/2020 1:00 PM Medical Record Number: [id number] Patient Account Number: [id number] Date of Birth/Sex: Treating RN: 22-Jun-1950 (70 y.o. Ardis Rowan, Lauren Primary Care Yvaine Jankowiak: Williemae Natter Other Clinician: Referring Gaje Tennyson: Treating Tayllor Breitenstein/Extender: Erma Heritage in Treatment: 3 Visit Information History Since Last Visit Added or deleted any medications: No Patient Arrived: Other Any new allergies or adverse reactions: No Arrival Time: 13:52 Had a fall or experienced change in No Accompanied By: self activities of daily living that may affect Transfer Assistance: None risk of falls: Patient Identification Verified: Yes Signs or symptoms of abuse/neglect since last visito No Secondary Verification Process Completed: Yes Hospitalized since last visit: No Patient Requires Transmission-Based Precautions: No Implantable device outside of the clinic excluding No Patient Has Alerts: Yes cellular tissue based products placed in the center Patient Alerts: Patient on Blood Thinner since last visit: Has Dressing in Place as Prescribed: Yes Pain Present Now: No Electronic Signature(s) Signed: 05/28/2020 4:05:28 PM By: Fonnie Mu RN Entered By: Fonnie Mu on 05/26/2020 13:54:19 -------------------------------------------------------------------------------- Compression Therapy Details Patient Name: Date of Service: [name] 05/26/2020 1:00 PM Medical Record Number: [id number] Patient Account Number: [id number] Date of Birth/Sex: Treating RN: 1950-10-26 (70 y.o. Wynelle Link Primary Care Datha Kissinger: Williemae Natter Other Clinician: Referring Janiah Devinney: Treating Shaiden Aldous/Extender: Erma Heritage in Treatment: 3 Compression Therapy Performed for Wound Assessment: Wound #6  Right,Posterior Lower Leg Performed By: Clinician Zandra Abts, RN Compression Type: Three Layer Post Procedure Diagnosis Same as Pre-procedure Electronic Signature(s) Signed: 05/26/2020 5:18:45 PM By: Zandra Abts RN, BSN Entered By: Zandra Abts on 05/26/2020 14:29:50 -------------------------------------------------------------------------------- Encounter Discharge Information Details Patient Name: Date of Service: KEHLANI, VANCAMP 05/26/2020 1:00 PM Medical Record Number: [id number] Patient Account Number: [id number] Date of Birth/Sex: Treating RN: September 06, 1950 (70 y.o. Ardis Rowan, Lauren Primary Care Shoni Quijas: Williemae Natter Other Clinician: Referring Sheva Mcdougle: Treating Lynesha Bango/Extender: Erma Heritage in Treatment: 3 Encounter Discharge Information Items Discharge Condition: Stable Ambulatory Status: Ambulatory Discharge Destination: Home Transportation: Other Accompanied By: self Schedule Follow-up Appointment: Yes Clinical Summary of Care: Patient Declined Electronic Signature(s) Signed: 05/28/2020 4:05:28 PM By: Fonnie Mu RN Entered By: Fonnie Mu on 05/26/2020 16:21:14 -------------------------------------------------------------------------------- Lower Extremity Assessment Details Patient Name: Date of Service: MADDALYN, LUTZE 05/26/2020 1:00 PM Medical Record Number: [id number] Patient Account Number: [id number] Date of Birth/Sex: Treating RN: Mar 31, 1951 (70 y.o. Ardis Rowan, Lauren Primary Care Jalayna Josten: Williemae Natter Other Clinician: Referring Shastina Rua: Treating Klein Willcox/Extender: Erma Heritage in Treatment: 3 Edema Assessment Assessed: Kyra Searles: No] Franne Forts: No] Edema: [Left: Ye] [Right: s] Calf Left: Right: Point of Measurement: 11 cm From Medial Instep 35 cm Ankle Left: Right: Point of Measurement: 30 cm From Medial Instep 21.5 cm Vascular Assessment Pulses: Dorsalis Pedis Palpable:  [Right:Yes] Posterior Tibial Palpable: [Right:Yes] Electronic Signature(s) Signed: 05/28/2020 4:05:28 PM By: Fonnie Mu RN Entered By: Fonnie Mu on 05/26/2020 13:59:37 -------------------------------------------------------------------------------- Multi-Disciplinary Care Plan Details Patient Name: Date of Service: BITA, CARTWRIGHT 05/26/2020 1:00 PM Medical Record Number: [id number] Patient Account Number: [id number] Date of Birth/Sex: Treating RN: 12-05-1950 (70 y.o. Wynelle Link Primary Care Josie Burleigh: Williemae Natter Other Clinician: Referring Bryceton Hantz: Treating Ayyub Krall/Extender: Erma Heritage in Treatment: 3 Active Inactive Abuse / Safety / Falls / Self Care Management Nursing Diagnoses: Potential for falls Goals: Patient/caregiver will verbalize/demonstrate measures taken to prevent injury and/or falls Date  Initiated: 05/05/2020 Target Resolution Date: 06/02/2020 Goal Status: Active Interventions: Assess fall risk on admission and as needed Assess impairment of mobility on admission and as needed per policy Notes: Nutrition Nursing Diagnoses: Impaired glucose control: actual or potential Potential for alteratiion in Nutrition/Potential for imbalanced nutrition Goals: Patient/caregiver will maintain therapeutic glucose control Date Initiated: 05/05/2020 Target Resolution Date: 06/02/2020 Goal Status: Active Interventions: Assess HgA1c results as ordered upon admission and as needed Assess patient nutrition upon admission and as needed per policy Provide education on elevated blood sugars and impact on wound healing Treatment Activities: Patient referred to Primary Care Physician for further nutritional evaluation : 05/05/2020 Notes: Wound/Skin Impairment Nursing Diagnoses: Impaired tissue integrity Knowledge deficit related to ulceration/compromised skin integrity Goals: Patient/caregiver will verbalize understanding of  skin care regimen Date Initiated: 05/05/2020 Target Resolution Date: 06/02/2020 Goal Status: Active Ulcer/skin breakdown will have a volume reduction of 30% by week 4 Date Initiated: 05/05/2020 Target Resolution Date: 06/02/2020 Goal Status: Active Interventions: Assess patient/caregiver ability to obtain necessary supplies Assess patient/caregiver ability to perform ulcer/skin care regimen upon admission and as needed Assess ulceration(s) every visit Treatment Activities: Skin care regimen initiated : 05/05/2020 Topical wound management initiated : 05/05/2020 Notes: Electronic Signature(s) Signed: 05/26/2020 5:18:45 PM By: Zandra Abts RN, BSN Entered By: Zandra Abts on 05/26/2020 16:17:29 -------------------------------------------------------------------------------- Pain Assessment Details Patient Name: Date of Service: DONNICA, JARNAGIN 05/26/2020 1:00 PM Medical Record Number: [id number] Patient Account Number: [id number] Date of Birth/Sex: Treating RN: [birth year] (70 y.o. Ardis Rowan, Lauren Primary Care Degan Hanser: Williemae Natter Other Clinician: Referring Sante Biedermann: Treating Mirna Sutcliffe/Extender: Erma Heritage in Treatment: 3 Active Problems Location of Pain Severity and Description of Pain Patient Has Paino No Site Locations Rate the pain. Current Pain Level: 0 Pain Management and Medication Current Pain Management: Electronic Signature(s) Signed: 05/28/2020 4:05:28 PM By: Fonnie Mu RN Entered By: Fonnie Mu on 05/26/2020 13:58:59 -------------------------------------------------------------------------------- Patient/Caregiver Education Details Patient Name: Date of Service: [name] 1/5/2022andnbsp1:00 PM Medical Record Number: [id number] Patient Account Number: [id number] Date of Birth/Gender: Treating RN: [birth year] (70 y.o. Wynelle Link Primary Care Physician: Williemae Natter Other Clinician: Referring  Physician: Treating Physician/Extender: Erma Heritage in Treatment: 3 Education Assessment Education Provided To: Patient Education Topics Provided Wound/Skin Impairment: Methods: Explain/Verbal Responses: State content correctly Electronic Signature(s) Signed: 05/26/2020 5:18:45 PM By: Zandra Abts RN, BSN Entered By: Zandra Abts on 05/26/2020 16:17:43 -------------------------------------------------------------------------------- Wound Assessment Details Patient Name: Date of Service: PARISS, HOMMES 05/26/2020 1:00 PM Medical Record Number: [id number] Patient Account Number: [id number] Date of Birth/Sex: Treating RN: 08-Apr-1951 (70 y.o. Ardis Rowan, Lauren Primary Care Jashanti Clinkscale: Williemae Natter Other Clinician: Referring Bereket Gernert: Treating Destany Severns/Extender: Erma Heritage in Treatment: 3 Wound Status Wound Number: 6 Primary Diabetic Wound/Ulcer of the Lower Extremity Etiology: Wound Location: Right, Posterior Lower Leg Secondary Arterial Insufficiency Ulcer Wounding Event: Gradually Appeared Etiology: Date Acquired: 05/06/2019 Wound Open Weeks Of Treatment: 3 Status: Clustered Wound: No Comorbid Cataracts, Chronic Obstructive Pulmonary Disease (COPD), History: Congestive Heart Failure, Hypertension, Peripheral Arterial Disease, Peripheral Venous Disease, Type II Diabetes, Neuropathy Wound Measurements Length: (cm) 4 Width: (cm) 2 Depth: (cm) 0.1 Area: (cm) 6.283 Volume: (cm) 0.628 % Reduction in Area: 23.8% % Reduction in Volume: 23.9% Epithelialization: Medium (34-66%) Tunneling: No Undermining: No Wound Description Classification: Grade 2 Wound Margin: Distinct, outline attached Exudate Amount: Medium Exudate Type: Serosanguineous Exudate Color: red, brown Foul Odor After Cleansing: No Slough/Fibrino Yes Wound Bed Granulation Amount: Large (67-100%) Exposed  Structure Granulation Quality:  Red Fascia Exposed: No Necrotic Amount: Small (1-33%) Fat Layer (Subcutaneous Tissue) Exposed: Yes Necrotic Quality: Adherent Slough Tendon Exposed: No Muscle Exposed: No Joint Exposed: No Bone Exposed: No Treatment Notes Wound #6 (Lower Leg) Wound Laterality: Right, Posterior Cleanser Wound Cleanser Discharge Instruction: Cleanse the wound with wound cleanser prior to applying a clean dressing using gauze sponges, not tissue or cotton balls. Peri-Wound Care Sween Lotion (Moisturizing lotion) Discharge Instruction: Apply moisturizing lotion such as eucerin cream to right leg with dressing changes Topical Primary Dressing Hydrofera Blue Classic Foam, 2x2 in Discharge Instruction: Moisten with saline prior to applying to wound bed Secondary Dressing Woven Gauze Sponge, Non-Sterile 4x4 in Discharge Instruction: Apply over primary dressing as directed. ABD Pad, 5x9 Discharge Instruction: Apply over primary dressing as directed. Secured With Compression Wrap ThreePress (3 layer compression wrap) Discharge Instruction: Apply three layer compression as directed. Compression Stockings Add-Ons Electronic Signature(s) Signed: 05/28/2020 4:05:28 PM By: Rhae Hammock RN Entered By: Rhae Hammock on 05/26/2020 14:09:00 -------------------------------------------------------------------------------- Vitals Details Patient Name: Date of Service: YARELLY, KUBA 05/26/2020 1:00 PM Medical Record Number: [id number] Patient Account Number: [id number] Date of Birth/Sex: Treating RN: Jul 30, 1950 (70 y.o. Tonita Phoenix, Lauren Primary Care Adalina Dopson: Esperanza Sheets Other Clinician: Referring Yarithza Mink: Treating Javonni Macke/Extender: Risa Grill in Treatment: 3 Vital Signs Time Taken: 13:54 Temperature (F): 98 Height (in): 63 Pulse (bpm): 74 Weight (lbs): 183 Respiratory Rate (breaths/min): 18 Body Mass Index (BMI): 32.4 Blood Pressure (mmHg):  147/73 Reference Range: 80 - 120 mg / dl Electronic Signature(s) Signed: 05/28/2020 4:05:28 PM By: Rhae Hammock RN Entered By: Rhae Hammock on 05/26/2020 13:58:33

## 2020-06-09 ENCOUNTER — Encounter (HOSPITAL_BASED_OUTPATIENT_CLINIC_OR_DEPARTMENT_OTHER): Payer: Medicare Other | Admitting: Physician Assistant

## 2020-06-16 ENCOUNTER — Other Ambulatory Visit: Payer: Self-pay

## 2020-06-16 ENCOUNTER — Encounter (HOSPITAL_BASED_OUTPATIENT_CLINIC_OR_DEPARTMENT_OTHER): Payer: Medicare Other | Admitting: Physician Assistant

## 2020-06-16 DIAGNOSIS — E11622 Type 2 diabetes mellitus with other skin ulcer: Secondary | ICD-10-CM | POA: Diagnosis not present

## 2020-06-16 NOTE — Progress Notes (Addendum)
Patricia [name], Patricia [name] ([id number]) Visit Report for 06/16/2020 Chief Complaint Document Details Patient Name: Date of Service: Patricia [name], Patricia [name] 06/16/2020 1:00 PM Medical Record Number: [id number] Patient Account Number: [id number] Date of Birth/Sex: Treating RN: 1950/10/23 (70 y.o. Tommye Standard Primary Care Provider: Williemae Natter Other Clinician: Referring Provider: Treating Provider/Extender: Erma Heritage in Treatment: 6 Information Obtained from: Patient Chief Complaint Right LE Ulcers Electronic Signature(s) Signed: 06/16/2020 1:20:20 PM By: Lenda Kelp PA-C Entered By: Lenda Kelp on 06/16/2020 13:20:20 -------------------------------------------------------------------------------- HPI Details Patient Name: Date of Service: Patricia [name] 06/16/2020 1:00 PM Medical Record Number: [id number] Patient Account Number: [id number] Date of Birth/Sex: Treating RN: 08/25/1950 (70 y.o. 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 Patient has most symptoms from the right medial calf wound with pain and discomfort. She has been putting Neosporin ointment on this wound and on the other wounds on the right and left legs. ABI in the clinic was unobtainable Patient's history significant for type 2 diabetes poorly controlled with the latest A1c of 11.8, ongoing tobacco abuse, COPD-non-O2 dependent, CHF/chronic diastolic heart failure, Patient  denies any fevers or chills, denies any other symptoms other than discomfort in the legs due to the wounds. 8/19; this is a patient who is a type II diabetic and a smoker. She was admitted to clinic last week with bilateral small punched-out painful wounds on her bilateral lower extremities and feet. Her ABIs in the clinic were 0.45 on the left and noncompressible on the right. She lives in [place] and requires public transportation to get her back and forth. We attempted to arrange for arterial studies at the hospital in [place] city but we do not have any information on this. We also ordered Santyl from [employer] she did not get that either. She has been applying a and D ointment to the wounds In speaking to the patient she is a minimal ambulator limited by both pain in her legs which may be claudication although she has apparently lumbar spinal stenosis. She tells me the maximum amount of walking she can do is 4 minutes while walking her dog 9/2; the patient finally had her arterial studies at [facility]. They were read by Dr. Rosalee Kaufman Radiology at [facility]. As it turns out she also had prior studies that I did not know about in August 2018. At that point her ABI on the right was 0.7. Noted to have calcific plaque at the common femoral bifurcation with mildly elevated systolic peak pressures focally elevated peak systolic velocities in the distal SFA and monophasic waveforms in the popliteal artery and runoff vessels. On the left her ABI was 0.5 plaque in the common femoral artery segmental occlusion of the mid SFA with tandem areas of stenosis resulting in markedly increased peak flow velocities focally in the mid segment monophasic arterial waveforms in the distal popliteal and runoff vessels. She did not have further evaluation that I am aware of in 2018 In any case  her repeat studies. They did not do a right ABI. The evaluation suggested possible distal common femoral artery  stenosis and probable diffuse disease throughout the superficial femoral artery popliteal artery and tibial arteries. There were monophasic waveforms in the superficial femoral artery on the right and left. Popliteal arteries showed monophasic waveforms bilaterally. There is no question in my mind she will require some form of angiography if not standard asked angiography then perhaps CT angiography The patient is a minimal ambulator. Her wounds are not making much progress. She could not afford the Santyl and there therefore is using Medihoney. Readmission: 05/05/2020 upon evaluation today patient appears to be doing well with regard to her wound all things considered. This is actually a reevaluation that we have not seen her for several months that she was trying to take care of this on her own at home. She did have an angiogram performed by vascular and interventional radiology specialist at [facility] imaging. Subsequently they were able to improve her blood flow in the right leg which was great news post intervention the patient seems to be doing much better. In fact she just has 1 wound remaining at this time and this wound even is showing signs of improvement. With that being said she does still need further help with getting the wound to heal. I think we can be beneficial in this regard. No fevers, chills, nausea, vomiting, or diarrhea. The patient does have diabetes mellitus type 2, hypertension, COPD, and congestive heart failure coupled with the peripheral vascular disease that is doing better after the angiogram formed on 03/25/2020 05/26/20 on evaluation today patient actually appears to be doing better in regard to her wound this is measuring smaller that is great news. With that being said there does not appear to be any signs of of active infection at this time. No fevers, chills, nausea, vomiting, or diarrhea. With that being said the patient does tell me that she is having some  irritation around the wound I definitely see this I think this is due to adhesive. When we just used a roll gauze she tells me this does not stay up. 06/17/2019 upon evaluation today patient appears to be doing well with regard to her leg ulcer. With that being said from what I can tell and what she tells me today I am not sure that she has done really anything that we have suggested over the past several weeks since we last saw her. She apparently as of right now has been putting a mixture of lotion/Vaseline over the wound and then just covering this with gauze as best I can tell. With that being said she is also leaving it open area sometimes because "it needs air". Nonetheless the wound is looking better and measuring a little smaller she tells me that she cannot tolerate the compression wrap at all. Electronic Signature(s) Signed: 06/16/2020 2:05:40 PM By: Lenda Kelp PA-C Entered By: Lenda Kelp on 06/16/2020 14:05:40 -------------------------------------------------------------------------------- Physical Exam Details Patient Name: Date of Service: Patricia [name], Patricia [name] 06/16/2020 1:00 PM Medical Record Number: [id number] Patient Account Number: [id number] Date of Birth/Sex: Treating RN: Nov 15, 1950 (70 y.o. Tommye Standard Primary Care Provider: Williemae Natter Other Clinician: Referring Provider: Treating Provider/Extender: Erma Heritage in Treatment: 6 Constitutional Well-nourished and well-hydrated in no acute distress. Respiratory normal breathing without difficulty. Psychiatric this patient is able to make decisions and demonstrates good insight into disease process. Alert and Oriented x 3. pleasant and cooperative. Notes  Patient's wound again showed signs of improvement from a size perspective does seem to be doing well with somewhat little dry and did have some slough on the surface of the wound I think this is just due to the fact that she has been  leaving this open area at times. Fortunately there does not appear to be signs of active infection which is great news overall however I am concerned about the fact that we cannot really get her edema under control as she does not tolerate the compression wrap also I am not sure what the home health nurse has been doing when she has been coming out with the orders that we previously sent. Electronic Signature(s) Signed: 06/16/2020 2:06:15 PM By: Lenda Kelp PA-C Entered By: Lenda Kelp on 06/16/2020 14:06:15 -------------------------------------------------------------------------------- Physician Orders Details Patient Name: Date of Service: Patricia [name], Patricia [name] 06/16/2020 1:00 PM Medical Record Number: [id number] Patient Account Number: [id number] Date of Birth/Sex: Treating RN: 1950-09-18 (70 y.o. Tommye Standard Primary Care Provider: Williemae Natter Other Clinician: Referring Provider: Treating Provider/Extender: Erma Heritage in Treatment: 6 Verbal / Phone Orders: No Diagnosis Coding ICD-10 Coding Code Description E11.622 Type 2 diabetes mellitus with other skin ulcer L97.812 Non-pressure chronic ulcer of other part of right lower leg with fat layer exposed E11.42 Type 2 diabetes mellitus with diabetic polyneuropathy I73.89 Other specified peripheral vascular diseases I10 Essential (primary) hypertension J44.9 Chronic obstructive pulmonary disease, unspecified I50.42 Chronic combined systolic (congestive) and diastolic (congestive) heart failure Follow-up Appointments Return Appointment in 2 weeks. Bathing/ Shower/ Hygiene May shower and wash wound with soap and water. Edema Control - Lymphedema / SCD / Other Elevate legs to the level of the heart or above for 30 minutes daily and/or when sitting, a frequency of: - throughout the day Exercise regularly Moisturize legs daily. Home Health New wound care orders this week; continue Home Health for  wound care. May utilize formulary equivalent dressing for wound treatment orders unless otherwise specified. Other Home Health Orders/Instructions: [facility] fax [phone] Wound Treatment Wound #6 - Lower Leg Wound Laterality: Right, Posterior Cleanser: Wound Cleanser (Home Health) 3 x Per Week/30 Days Discharge Instructions: Cleanse the wound with wound cleanser prior to applying a clean dressing using gauze sponges, not tissue or cotton balls. Peri-Wound Care: Sween Lotion (Moisturizing lotion) 3 x Per Week/30 Days Discharge Instructions: Apply moisturizing lotion such as eucerin cream to right leg with dressing changes Prim Dressing: Hydrofera Blue Ready Foam, 2.5 x2.5 in (Home Health) 3 x Per Week/30 Days ary Discharge Instructions: Apply to wound bed as instructed Secondary Dressing: Woven Gauze Sponge, Non-Sterile 4x4 in (Home Health) 3 x Per Week/30 Days Discharge Instructions: Apply over primary dressing as directed. Secured With: Insurance underwriter, Sterile 2x75 (in/in) (Home Health) 3 x Per Week/30 Days Discharge Instructions: Secure with stretch gauze as directed. Electronic Signature(s) Signed: 06/16/2020 5:03:34 PM By: Lenda Kelp PA-C Signed: 06/16/2020 6:09:51 PM By: Zenaida Deed RN, BSN Entered By: Zenaida Deed on 06/16/2020 14:02:52 -------------------------------------------------------------------------------- Problem List Details Patient Name: Date of Service: Patricia [name], Patricia [name] 06/16/2020 1:00 PM Medical Record Number: [id number] Patient Account Number: [id number] Date of Birth/Sex: Treating RN: 17-Sep-1950 (70 y.o. Tommye Standard Primary Care Provider: Williemae Natter Other Clinician: Referring Provider: Treating Provider/Extender: Erma Heritage in Treatment: 6 Active Problems ICD-10 Encounter Code Description Active Date MDM Diagnosis E11.622 Type 2 diabetes mellitus with other skin ulcer 05/05/2020 No  Yes L97.812 Non-pressure chronic ulcer  of other part of right lower leg with fat layer 05/05/2020 No Yes exposed E11.42 Type 2 diabetes mellitus with diabetic polyneuropathy 05/05/2020 No Yes I73.89 Other specified peripheral vascular diseases 05/05/2020 No Yes I10 Essential (primary) hypertension 05/05/2020 No Yes J44.9 Chronic obstructive pulmonary disease, unspecified 05/05/2020 No Yes I50.42 Chronic combined systolic (congestive) and diastolic (congestive) heart failure 05/05/2020 No Yes Inactive Problems Resolved Problems Electronic Signature(s) Signed: 06/16/2020 1:20:15 PM By: Lenda Kelp PA-C Entered By: Lenda Kelp on 06/16/2020 13:20:15 -------------------------------------------------------------------------------- Progress Note Details Patient Name: Date of Service: Patricia [name], Patricia [name] 06/16/2020 1:00 PM Medical Record Number: [id number] Patient Account Number: [id number] Date of Birth/Sex: Treating RN: December 28, 1950 (70 y.o. 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 Patient has most symptoms from the right medial calf wound with pain and discomfort. She has been putting Neosporin ointment on this wound and on the other wounds on the right and left legs. ABI in the clinic was unobtainable Patient's history significant for type 2 diabetes poorly controlled with the latest A1c of 11.8,  ongoing tobacco abuse, COPD-non-O2 dependent, CHF/chronic diastolic heart failure, Patient denies any fevers or chills, denies any other symptoms other than discomfort in the legs due to the wounds. 8/19; this is a patient who is a type II diabetic and a smoker. She was admitted to clinic last week with bilateral small punched-out painful wounds on her bilateral lower extremities and feet. Her ABIs in the clinic were 0.45 on the left and noncompressible on the right. She lives in [place] city and requires public transportation to get her back and forth. We attempted to arrange for arterial studies at the hospital in [place] but we do not have any information on this. We also ordered Santyl from [employer] she did not get that either. She has been applying a and D ointment to the wounds In speaking to the patient she is a minimal ambulator limited by both pain in her legs which may be claudication although she has apparently lumbar spinal stenosis. She tells me the maximum amount of walking she can do is 4 minutes while walking her dog 9/2; the patient finally had her arterial studies at [facility]. They were read by Dr. Rosalee Kaufman Radiology at [facility]. As it turns out she also had prior studies that I did not know about in August 2018. At that point her ABI on the right was 0.7. Noted to have calcific plaque at the common femoral bifurcation with mildly elevated systolic peak pressures focally elevated peak systolic velocities in the distal SFA and monophasic waveforms in the popliteal artery and runoff vessels. On the left her ABI was 0.5 plaque in the common femoral artery segmental occlusion of the mid SFA with tandem areas of stenosis resulting in markedly increased peak flow velocities focally in the mid segment monophasic arterial waveforms in the distal popliteal and runoff vessels. She did not have further evaluation that I am aware  of in 2018 In any case her repeat studies.  They did not do a right ABI. The evaluation suggested possible distal common femoral artery stenosis and probable diffuse disease throughout the superficial femoral artery popliteal artery and tibial arteries. There were monophasic waveforms in the superficial femoral artery on the right and left. Popliteal arteries showed monophasic waveforms bilaterally. There is no question in my mind she will require some form of angiography if not standard asked angiography then perhaps CT angiography The patient is a minimal ambulator. Her wounds are not making much progress. She could not afford the Santyl and there therefore is using Medihoney. Readmission: 05/05/2020 upon evaluation today patient appears to be doing well with regard to her wound all things considered. This is actually a reevaluation that we have not seen her for several months that she was trying to take care of this on her own at home. She did have an angiogram performed by vascular and interventional radiology specialist at [facility] imaging. Subsequently they were able to improve her blood flow in the right leg which was great news post intervention the patient seems to be doing much better. In fact she just has 1 wound remaining at this time and this wound even is showing signs of improvement. With that being said she does still need further help with getting the wound to heal. I think we can be beneficial in this regard. No fevers, chills, nausea, vomiting, or diarrhea. The patient does have diabetes mellitus type 2, hypertension, COPD, and congestive heart failure coupled with the peripheral vascular disease that is doing better after the angiogram formed on 03/25/2020 05/26/20 on evaluation today patient actually appears to be doing better in regard to her wound this is measuring smaller that is great news. With that being said there does not appear to be any signs of of active infection at this time. No fevers, chills, nausea, vomiting,  or diarrhea. With that being said the patient does tell me that she is having some irritation around the wound I definitely see this I think this is due to adhesive. When we just used a roll gauze she tells me this does not stay up. 06/17/2019 upon evaluation today patient appears to be doing well with regard to her leg ulcer. With that being said from what I can tell and what she tells me today I am not sure that she has done really anything that we have suggested over the past several weeks since we last saw her. She apparently as of right now has been putting a mixture of lotion/Vaseline over the wound and then just covering this with gauze as best I can tell. With that being said she is also leaving it open area sometimes because "it needs air". Nonetheless the wound is looking better and measuring a little smaller she tells me that she cannot tolerate the compression wrap at all. Objective Constitutional Well-nourished and well-hydrated in no acute distress. Vitals Time Taken: 1:18 PM, Height: 63 in, Weight: 183 lbs, BMI: 32.4, Temperature: 98.4 F, Pulse: 74 bpm, Respiratory Rate: 18 breaths/min, Blood Pressure: 189/79 mmHg. Respiratory normal breathing without difficulty. Psychiatric this patient is able to make decisions and demonstrates good insight into disease process. Alert and Oriented x 3. pleasant and cooperative. General Notes: Patient's wound again showed signs of improvement from a size perspective does seem to be doing well with somewhat little dry and did have some slough on the surface of the wound I think this is just due  to the fact that she has been leaving this open area at times. Fortunately there does not appear to be signs of active infection which is great news overall however I am concerned about the fact that we cannot really get her edema under control as she does not tolerate the compression wrap also I am not sure what the home health nurse has been doing when  she has been coming out with the orders that we previously sent. Integumentary (Hair, Skin) Wound #6 status is Open. Original cause of wound was Gradually Appeared. The wound is located on the Right,Posterior Lower Leg. The wound measures 2.7cm length x 1cm width x 0.1cm depth; 2.121cm^2 area and 0.212cm^3 volume. There is Fat Layer (Subcutaneous Tissue) exposed. There is no tunneling or undermining noted. There is a medium amount of serosanguineous drainage noted. The wound margin is distinct with the outline attached to the wound base. There is large (67-100%) red granulation within the wound bed. There is a small (1-33%) amount of necrotic tissue within the wound bed including Adherent Slough. Assessment Active Problems ICD-10 Type 2 diabetes mellitus with other skin ulcer Non-pressure chronic ulcer of other part of right lower leg with fat layer exposed Type 2 diabetes mellitus with diabetic polyneuropathy Other specified peripheral vascular diseases Essential (primary) hypertension Chronic obstructive pulmonary disease, unspecified Chronic combined systolic (congestive) and diastolic (congestive) heart failure Plan Follow-up Appointments: Return Appointment in 2 weeks. Bathing/ Shower/ Hygiene: May shower and wash wound with soap and water. Edema Control - Lymphedema / SCD / Other: Elevate legs to the level of the heart or above for 30 minutes daily and/or when sitting, a frequency of: - throughout the day Exercise regularly Moisturize legs daily. Home Health: New wound care orders this week; continue Home Health for wound care. May utilize formulary equivalent dressing for wound treatment orders unless otherwise specified. Other Home Health Orders/Instructions: [facility] fax [phone] WOUND #6: - Lower Leg Wound Laterality: Right, Posterior Cleanser: Wound Cleanser (Home Health) 3 x Per Week/30 Days Discharge Instructions: Cleanse the wound with wound cleanser prior to  applying a clean dressing using gauze sponges, not tissue or cotton balls. Peri-Wound Care: Sween Lotion (Moisturizing lotion) 3 x Per Week/30 Days Discharge Instructions: Apply moisturizing lotion such as eucerin cream to right leg with dressing changes Prim Dressing: Hydrofera Blue Ready Foam, 2.5 x2.5 in (Home Health) 3 x Per Week/30 Days ary Discharge Instructions: Apply to wound bed as instructed Secondary Dressing: Woven Gauze Sponge, Non-Sterile 4x4 in (Home Health) 3 x Per Week/30 Days Discharge Instructions: Apply over primary dressing as directed. Secured With: Insurance underwriter, Sterile 2x75 (in/in) (Home Health) 3 x Per Week/30 Days Discharge Instructions: Secure with stretch gauze as directed. 1. Would recommend currently that we continue with the [facility] I think that still the best way to go. Would use gauze over top of this followed by roll gauze to secure in place. 2. I am also can recommend the patient should try to elevate her legs much as possible especially since we cannot compression wrap her she needs to try to control the edema anyway she can. We will see patient back for reevaluation in 1 week here in the clinic. If anything worsens or changes patient will contact our office for additional recommendations. Electronic Signature(s) Signed: 06/16/2020 2:06:38 PM By: Lenda Kelp PA-C Entered By: Lenda Kelp on 06/16/2020 14:06:38 -------------------------------------------------------------------------------- SuperBill Details Patient Name: Date of Service: Patricia [name], Patricia [name] 06/16/2020 Medical Record Number: [id number]  Patient Account Number: [id number] Date of Birth/Sex: Treating RN: 1950/10/06 (70 y.o. Tommye Standard Primary Care Provider: Williemae Natter Other Clinician: Referring Provider: Treating Provider/Extender: Erma Heritage in Treatment: 6 Diagnosis Coding ICD-10 Codes Code Description [phone] Type 2  diabetes mellitus with other skin ulcer L97.812 Non-pressure chronic ulcer of other part of right lower leg with fat layer exposed E11.42 Type 2 diabetes mellitus with diabetic polyneuropathy I73.89 Other specified peripheral vascular diseases I10 Essential (primary) hypertension J44.9 Chronic obstructive pulmonary disease, unspecified I50.42 Chronic combined systolic (congestive) and diastolic (congestive) heart failure Facility Procedures CPT4 Code: [id number] Description: 99213 - WOUND CARE VISIT-LEV 3 EST PT Modifier: Quantity: 1 Physician Procedures : CPT4 Code Description Modifier [id number] 99213 - WC PHYS LEVEL 3 - EST PT ICD-10 Diagnosis Description E11.622 Type 2 diabetes mellitus with other skin ulcer L97.812 Non-pressure chronic ulcer of other part of right lower leg with fat layer exposed  E11.42 Type 2 diabetes mellitus with diabetic polyneuropathy I73.89 Other specified peripheral vascular diseases Quantity: 1 Electronic Signature(s) Signed: 06/16/2020 2:06:50 PM By: Lenda Kelp PA-C Entered By: Lenda Kelp on 06/16/2020 14:06:50

## 2020-06-16 NOTE — Progress Notes (Addendum)
Patricia [name] ([id number]) Visit Report for 06/16/2020 Arrival Information Details Patient Name: Date of Service: Patricia [name], Patricia [name] 06/16/2020 1:00 PM Medical Record Number: [id number] Patient Account Number: [id number] Date of Birth/Sex: Treating RN: 20-Feb-1951 (70 y.o. Ardis Rowan, Lauren Primary Care Florina Glas: Williemae Natter Other Clinician: Referring Khamil Lamica: Treating Izza Bickle/Extender: Erma Heritage in Treatment: 6 Visit Information History Since Last Visit Added or deleted any medications: No Patient Arrived: Other Any new allergies or adverse reactions: No Arrival Time: 13:12 Had a fall or experienced change in No Accompanied By: self activities of daily living that may affect Transfer Assistance: None risk of falls: Patient Identification Verified: Yes Signs or symptoms of abuse/neglect since last visito No Secondary Verification Process Completed: Yes Hospitalized since last visit: No Patient Requires Transmission-Based Precautions: No Implantable device outside of the clinic excluding No Patient Has Alerts: Yes cellular tissue based products placed in the center Patient Alerts: Patient on Blood Thinner since last visit: Has Dressing in Place as Prescribed: Yes Pain Present Now: Yes Electronic Signature(s) Signed: 06/16/2020 5:56:51 PM By: Fonnie Mu RN Entered By: Fonnie Mu on 06/16/2020 13:13:07 -------------------------------------------------------------------------------- Clinic Level of Care Assessment Details Patient Name: Date of Service: Patricia [name] 06/16/2020 1:00 PM Medical Record Number: [id number] Patient Account Number: [id number] Date of Birth/Sex: Treating RN: November 09, 1950 (70 y.o. Tommye Standard Primary Care Lameka Disla: Williemae Natter Other Clinician: Referring Madigan Rosensteel: Treating Yelitza Reach/Extender: Erma Heritage in Treatment: 6 Clinic Level of Care Assessment Items TOOL 4  Quantity Score  - 0 Use when only an EandM is performed on FOLLOW-UP visit ASSESSMENTS - Nursing Assessment / Reassessment X- 1 10 Reassessment of Co-morbidities (includes updates in patient status) X- 1 5 Reassessment of Adherence to Treatment Plan ASSESSMENTS - Wound and Skin A ssessment / Reassessment X - Simple Wound Assessment / Reassessment - one wound 1 5  - 0 Complex Wound Assessment / Reassessment - multiple wounds  - 0 Dermatologic / Skin Assessment (not related to wound area) ASSESSMENTS - Focused Assessment X- 1 5 Circumferential Edema Measurements - multi extremities  - 0 Nutritional Assessment / Counseling / Intervention X- 1 5 Lower Extremity Assessment (monofilament, tuning fork, pulses)  - 0 Peripheral Arterial Disease Assessment (using hand held doppler) ASSESSMENTS - Ostomy and/or Continence Assessment and Care  - 0 Incontinence Assessment and Management  - 0 Ostomy Care Assessment and Management (repouching, etc.) PROCESS - Coordination of Care X - Simple Patient / Family Education for ongoing care 1 15  - 0 Complex (extensive) Patient / Family Education for ongoing care X- 1 10 Staff obtains Chiropractor, Records, T Results / Process Orders est X- 1 10 Staff telephones HHA, Nursing Homes / Clarify orders / etc  - 0 Routine Transfer to another Facility (non-emergent condition)  - 0 Routine Hospital Admission (non-emergent condition)  - 0 New Admissions / Manufacturing engineer / Ordering NPWT Apligraf, etc. ,  - 0 Emergency Hospital Admission (emergent condition) X- 1 10 Simple Discharge Coordination  - 0 Complex (extensive) Discharge Coordination PROCESS - Special Needs  - 0 Pediatric / Minor Patient Management  - 0 Isolation Patient Management  - 0 Hearing / Language / Visual special needs  - 0 Assessment of Community assistance (transportation, D/C planning, etc.)  - 0 Additional assistance / Altered  mentation  - 0 Support Surface(s) Assessment (bed, cushion, seat, etc.) INTERVENTIONS - Wound Cleansing / Measurement X - Simple Wound Cleansing - one wound 1 5  - 0 Complex  Wound Cleansing - multiple wounds X- 1 5 Wound Imaging (photographs - any number of wounds) []  - 0 Wound Tracing (instead of photographs) X- 1 5 Simple Wound Measurement - one wound []  - 0 Complex Wound Measurement - multiple wounds INTERVENTIONS - Wound Dressings X - Small Wound Dressing one or multiple wounds 1 10 []  - 0 Medium Wound Dressing one or multiple wounds []  - 0 Large Wound Dressing one or multiple wounds X- 1 5 Application of Medications - topical []  - 0 Application of Medications - injection INTERVENTIONS - Miscellaneous []  - 0 External ear exam []  - 0 Specimen Collection (cultures, biopsies, blood, body fluids, etc.) []  - 0 Specimen(s) / Culture(s) sent or taken to Lab for analysis []  - 0 Patient Transfer (multiple staff / / Similar devices) []  - 0 Simple Staple / Suture removal (25 or less) []  - 0 Complex Staple / Suture removal (26 or more) []  - 0 Hypo / Hyperglycemic Management (close monitor of Blood Glucose) []  - 0 Ankle / Brachial Index (ABI) - do not check if billed separately X- 1 5 Vital Signs Has the patient been seen at the hospital within the last three years: Yes Total Score: 110 Level Of Care: New/Established - Level 3 Electronic Signature(s) Signed: 06/16/2020 6:09:51 PM By: RN, BSN Entered By: on 06/16/2020 13:57:38 -------------------------------------------------------------------------------- Encounter Discharge Information Details Patient Name: Date of Service: Patricia [name] 06/16/2020 1:00 PM Medical Record Number: Patient Account Number: Date of Birth/Sex: Treating RN: 1951/02/13 (70 y.o. , Lauren Primary Care Gianmarco Roye: Other Clinician: Referring  Irven Ingalsbe: Treating Shelisa Fern/Extender: in Treatment: 6 Encounter Discharge Information Items Discharge Condition: Stable Ambulatory Status: Walker Discharge Destination: Home Transportation: Other Accompanied By: 06/18/2020 Schedule Follow-up Appointment: Yes Clinical Summary of Care: Patient Declined Electronic Signature(s) Signed: 06/16/2020 5:56:51 PM By: Zenaida Deed RN Entered By: 06/18/2020 on 06/16/2020 13:56:34 -------------------------------------------------------------------------------- Lower Extremity Assessment Details Patient Name: Date of Service: ALICE, BURNSIDE 06/16/2020 1:00 PM Medical Record Number: [id number] Patient Account Number: 11/21/1950 Date of Birth/Sex: Treating RN: January 23, 1951 (70 y.o. Williemae Natter, Lauren Primary Care Owenn Rothermel: Erma Heritage Other Clinician: Referring Shaylah Mcghie: Treating Sundi Slevin/Extender: Melvenia Needles in Treatment: 6 Edema Assessment Assessed: 06/18/2020: No] Fonnie Mu: No] Edema: [Left: Ye] [Right: s] Calf Left: Right: Point of Measurement: 11 cm From Medial Instep 34.5 cm Ankle Left: Right: Point of Measurement: 30 cm From Medial Instep 21.5 cm Vascular Assessment Pulses: Dorsalis Pedis Palpable: [Right:Yes] Posterior Tibial Palpable: [Right:Yes] Electronic Signature(s) Signed: 06/16/2020 5:56:51 PM By: 06/18/2020 RN Entered By: [name] on 06/16/2020 13:18:49 -------------------------------------------------------------------------------- Multi-Disciplinary Care Plan Details Patient Name: Date of Service: MYLEKA, MONCURE 06/16/2020 1:00 PM Medical Record Number: 11/21/1950 Patient Account Number: [id number] Date of Birth/Sex: Treating RN: 1951/05/08 (70 y.o. Erma Heritage Primary Care Mabeline Varas: Kyra Searles Other Clinician: Referring Hovanes Hymas: Treating Sabre Romberger/Extender: Franne Forts in Treatment: 6 Active  Inactive Electronic Signature(s) Signed: 07/22/2020 10:42:12 AM By: Fonnie Mu RN, BSN Previous Signature: 06/16/2020 6:09:51 PM Version By: 06/18/2020 RN, BSN Entered By: [name] on 06/29/2020 16:57:06 -------------------------------------------------------------------------------- Pain Assessment Details Patient Name: Date of Service: CHARLESTON, VIERLING 06/16/2020 1:00 PM Medical Record Number: 11/21/1950 Patient Account Number: [id number] Date of Birth/Sex: Treating RN: 26-Mar-1951 (70 y.o. Erma Heritage, Lauren Primary Care Kallum Jorgensen: 09/21/2020 Other Clinician: Referring Jr Milliron: Treating Omran Keelin/Extender: Zenaida Deed in Treatment: 6 Active Problems  Location of Pain Severity and Description of Pain Patient Has Paino Yes Site Locations Pain Location: Pain Location: Generalized Pain With Dressing Change: Yes Duration of the Pain. Constant / Intermittento Constant Rate the pain. Current Pain Level: 8 Worst Pain Level: 10 Least Pain Level: 0 Tolerable Pain Level: 8 Character of Pain Describe the Pain: Aching Pain Management and Medication Current Pain Management: Medication: Yes Cold Application: No Rest: Yes Massage: No Activity: No T.E.N.S.: No Heat Application: No Leg drop or elevation: No Is the Current Pain Management Adequate: Adequate How does your wound impact your activities of daily livingo Sleep: No Bathing: No Appetite: No Relationship With Others: No Bladder Continence: No Emotions: No Bowel Continence: No Work: No Toileting: No Drive: No Dressing: No Hobbies: No Electronic Signature(s) Signed: 06/16/2020 5:56:51 PM By: Fonnie Mu RN Entered By: Fonnie Mu on 06/16/2020 13:19:53 -------------------------------------------------------------------------------- Patient/Caregiver Education Details Patient Name: Date of Service: [name] 1/26/2022andnbsp1:00 PM Medical Record  Number: [id number] Patient Account Number: [id number] Date of Birth/Gender: Treating RN: Jul 22, 1950 (70 y.o. Tommye Standard Primary Care Physician: Williemae Natter Other Clinician: Referring Physician: Treating Physician/Extender: Erma Heritage in Treatment: 6 Education Assessment Education Provided To: Patient Education Topics Provided Elevated Blood Sugar/ Impact on Healing: Methods: Explain/Verbal Responses: Reinforcements needed, State content correctly Venous: Methods: Explain/Verbal Responses: Reinforcements needed, State content correctly Wound/Skin Impairment: Methods: Explain/Verbal Responses: Reinforcements needed, State content correctly Electronic Signature(s) Signed: 06/16/2020 6:09:51 PM By: Zenaida Deed RN, BSN Entered By: Zenaida Deed on 06/16/2020 13:55:09 -------------------------------------------------------------------------------- Wound Assessment Details Patient Name: Date of Service: SHERIKA, SCHROM 06/16/2020 1:00 PM Medical Record Number: [id number] Patient Account Number: [id number] Date of Birth/Sex: Treating RN: Aug 03, 1950 (70 y.o. Ardis Rowan, Lauren Primary Care Denali Sharma: Williemae Natter Other Clinician: Referring Osiris Odriscoll: Treating Evalee Gerard/Extender: Erma Heritage in Treatment: 6 Wound Status Wound Number: 6 Primary Diabetic Wound/Ulcer of the Lower Extremity Etiology: Wound Location: Right, Posterior Lower Leg Secondary Arterial Insufficiency Ulcer Wounding Event: Gradually Appeared Etiology: Date Acquired: 05/06/2019 Wound Open Weeks Of Treatment: 6 Status: Clustered Wound: No Comorbid Cataracts, Chronic Obstructive Pulmonary Disease (COPD), History: Congestive Heart Failure, Hypertension, Peripheral Arterial Disease, Peripheral Venous Disease, Type II Diabetes, Neuropathy Photos Photo Uploaded By: Benjaman Kindler on 06/18/2020 14:55:10 Wound Measurements Length: (cm)  2.7 Width: (cm) 1 Depth: (cm) 0.1 Area: (cm) 2.121 Volume: (cm) 0.212 % Reduction in Area: 74.3% % Reduction in Volume: 74.3% Epithelialization: Medium (34-66%) Tunneling: No Undermining: No Wound Description Classification: Grade 2 Wound Margin: Distinct, outline attached Exudate Amount: Medium Exudate Type: Serosanguineous Exudate Color: red, brown Foul Odor After Cleansing: No Slough/Fibrino Yes Wound Bed Granulation Amount: Large (67-100%) Exposed Structure Granulation Quality: Red Fascia Exposed: No Necrotic Amount: Small (1-33%) Fat Layer (Subcutaneous Tissue) Exposed: Yes Necrotic Quality: Adherent Slough Tendon Exposed: No Muscle Exposed: No Joint Exposed: No Bone Exposed: No Electronic Signature(s) Signed: 06/16/2020 5:56:51 PM By: Fonnie Mu RN Entered By: Fonnie Mu on 06/16/2020 13:22:26 -------------------------------------------------------------------------------- Vitals Details Patient Name: Date of Service: [name] 06/16/2020 1:00 PM Medical Record Number: [id number] Patient Account Number: [id number] Date of Birth/Sex: Treating RN: 1951/03/13 (70 y.o. Ardis Rowan, Lauren Primary Care Stanislav Gervase: Williemae Natter Other Clinician: Referring Dondrell Loudermilk: Treating Aviella Disbrow/Extender: Erma Heritage in Treatment: 6 Vital Signs Time Taken: 13:18 Temperature (F): 98.4 Height (in): 63 Pulse (bpm): 74 Weight (lbs): 183 Respiratory Rate (breaths/min): 18 Body Mass Index (BMI): 32.4 Blood Pressure (mmHg): 189/79 Reference Range: 80 - 120 mg / dl Electronic Signature(s)  Signed: 06/16/2020 5:56:51 PM By: Fonnie Mu RN Entered By: Fonnie Mu on 06/16/2020 13:19:45

## 2020-06-30 ENCOUNTER — Encounter (HOSPITAL_BASED_OUTPATIENT_CLINIC_OR_DEPARTMENT_OTHER): Payer: Medicare Other | Admitting: Physician Assistant

## 2020-09-14 ENCOUNTER — Telehealth: Payer: Self-pay

## 2020-09-14 ENCOUNTER — Other Ambulatory Visit: Payer: Self-pay | Admitting: Interventional Radiology

## 2020-09-14 DIAGNOSIS — L97913 Non-pressure chronic ulcer of unspecified part of right lower leg with necrosis of muscle: Secondary | ICD-10-CM

## 2020-09-14 NOTE — Telephone Encounter (Signed)
S/W pt stated that she would like to hold off on scheduling due to transportation, pt stated that she does also see the wound clinic; WCB to schedule f/u when she can.

## 2021-06-22 IMAGING — XA IR ANGIO/EXT/UNI*R*
11 of 22 series · 11 of 24 positions shown · IV contrast (IODINE)
Comparison: none

INDICATION: 69-year-old female with a history of right-sided critical limb
ischemia/chronic limb threatening ischemia, with evidence of SFA
occlusion on noninvasive exam, presents for angiogram and possible
treatment for limb salvage and wound healing.

[Series 2: extr.4 care · 1 of 14 slices shown (1 of 8)]
[im 1/14]
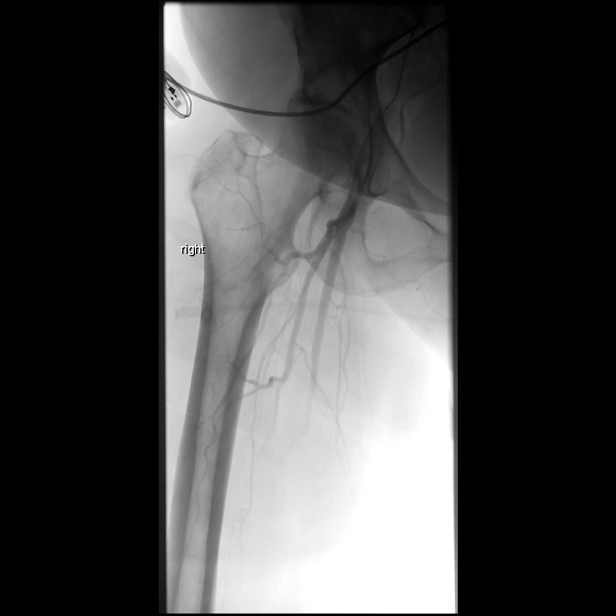

[Series 4: extr.4 care · 1 of 16 slices shown (2 of 8)]
[im 1/16]
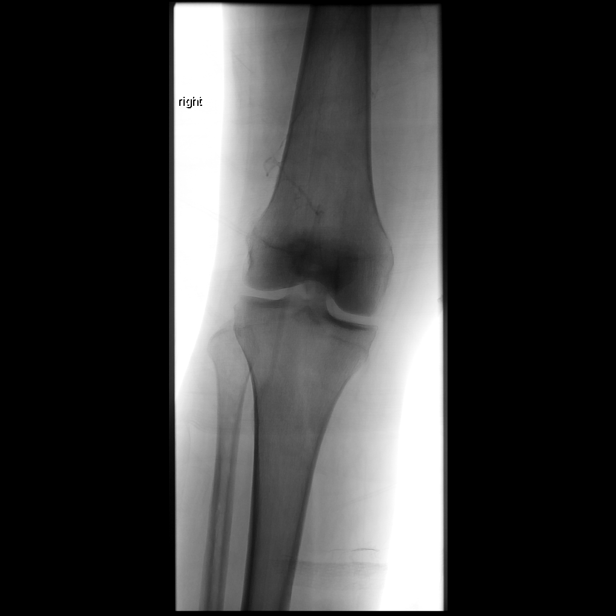

[Series 5: extr.4 care · 1 of 4 slices shown (3 of 8)]
[im 1/4]
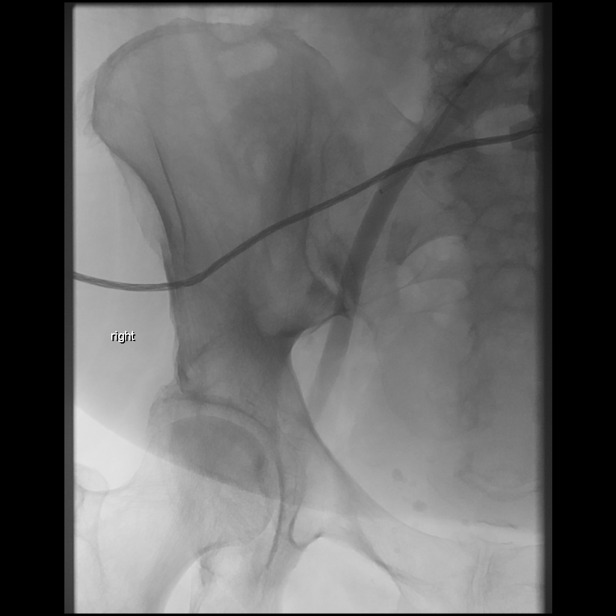

[Series 7: extr.4 care · 1 of 7 slices shown (4 of 8)]
[im 1/7]
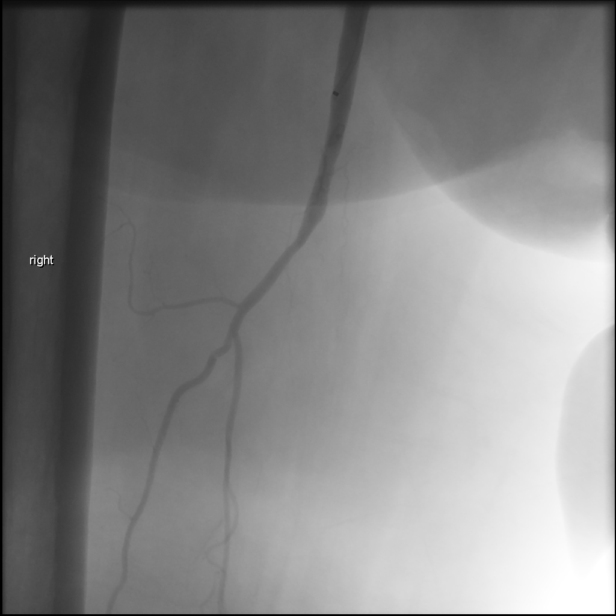

[Series 9: extr.4 care · 1 of 8 slices shown (5 of 8)]
[im 1/8]
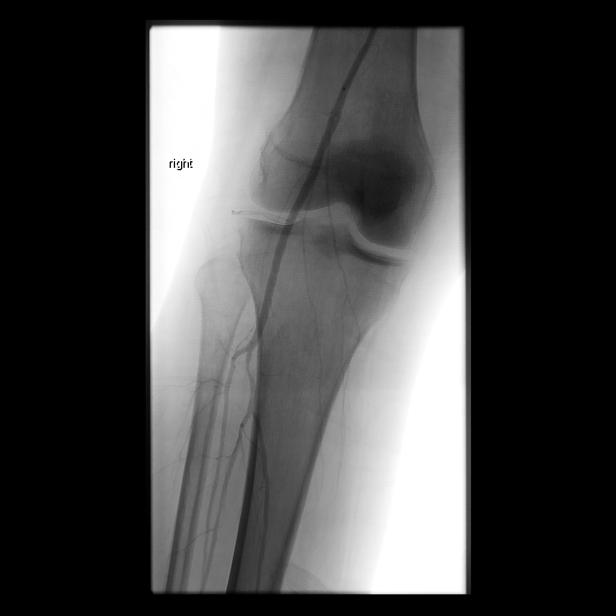

[Series 12: single care · 1 of 1 slices shown (1 of 3)]
[im 1/1]
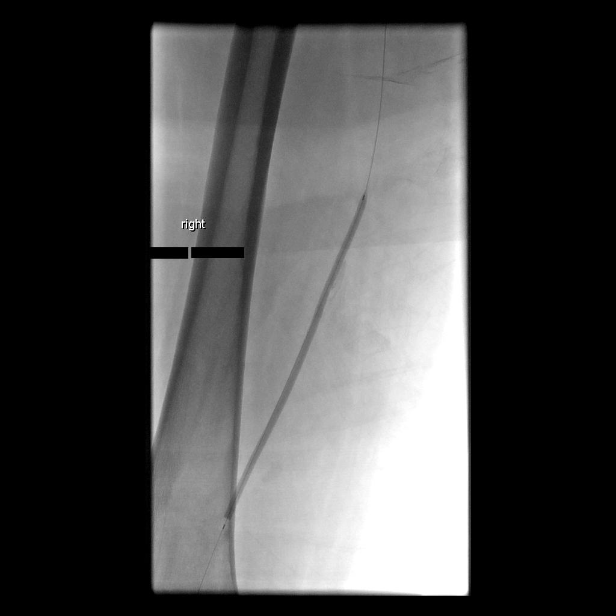

[Series 14: single care · 1 of 1 slices shown (2 of 3)]
[im 1/1]
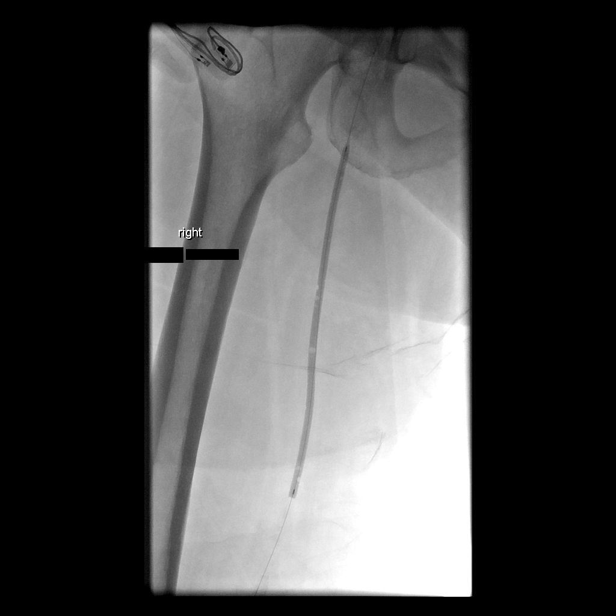

[Series 16: extr.4 care · 1 of 10 slices shown (6 of 8)]
[im 1/10]
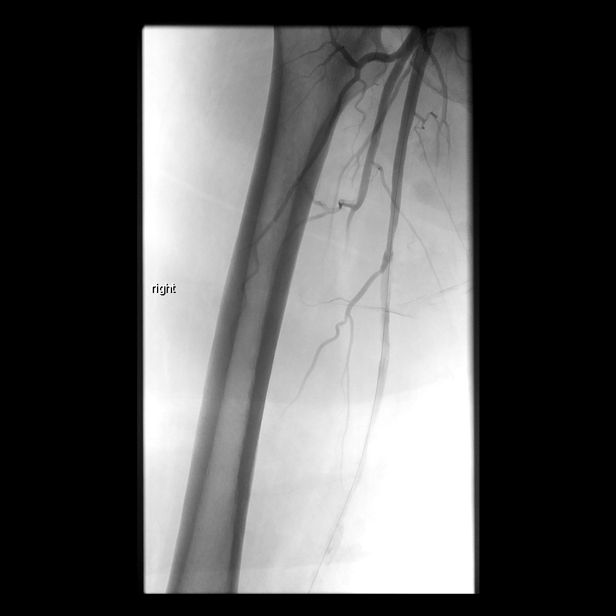

[Series 18: single care · 1 of 1 slices shown (3 of 3)]
[im 1/1]
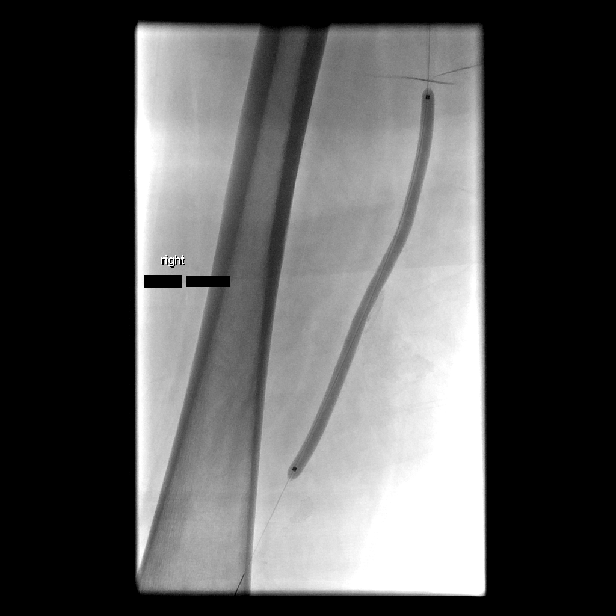

[Series 20: extr.4 care · 1 of 11 slices shown (7 of 8)]
[im 1/11]
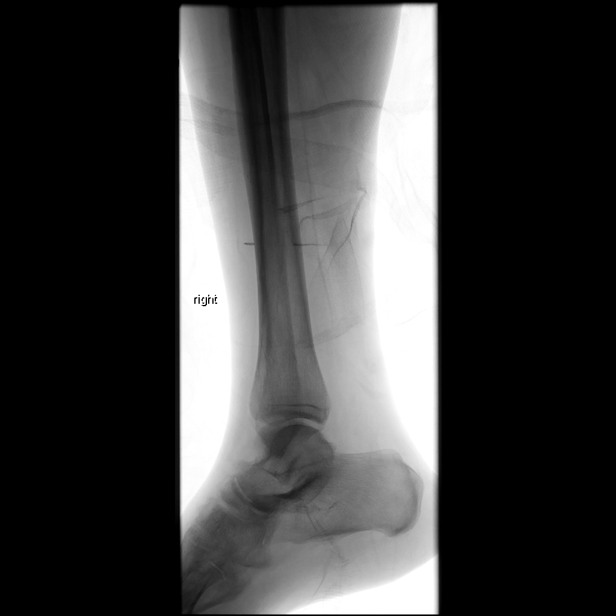

[Series 21: extr.4 care · 1 of 14 slices shown (8 of 8)]
[im 14/14]
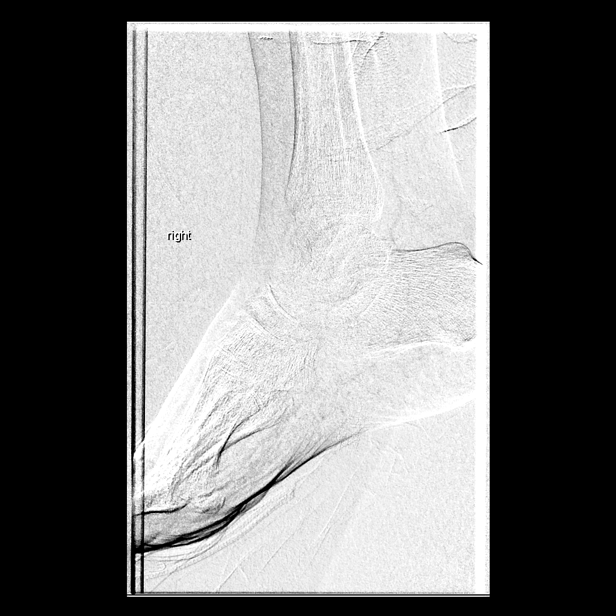

[11 of 24 positions shown; findings below may reference images not displayed]

EXAM:
ULTRASOUND-GUIDED ACCESS LEFT COMMON FEMORAL ARTERY

RIGHT LOWER EXTREMITY ANGIOGRAM

TREATMENT CHRONIC TOTAL OCCLUSION RIGHT SFA WITH DRUG-ELUTING
BALLOON ANGIOPLASTY

ANGIO-SEAL FOR HEMOSTASIS

MEDICATIONS:
6222 units IV heparin.  50 mg IV protamine sulfate

ANESTHESIA/SEDATION:
Moderate (conscious) sedation was employed during this procedure. A
total of Versed 2.0 mg and Fentanyl 100 mcg, 1.0 mg Dilaudid was
administered intravenously.

Moderate Sedation Time: 69 minutes. The patient's level of
consciousness and vital signs were monitored continuously by
radiology nursing throughout the procedure under my direct
supervision.

CONTRAST:  88 cc visit Schubert 320

FLUOROSCOPY TIME:  Fluoroscopy Time: 13 minutes 54 seconds (162
mGy).

COMPLICATIONS:
None



Ultrasound survey of the left inguinal region was performed with
images stored and sent to PACs, confirming patency of the vessel.

A micropuncture needle was used access the left common femoral
artery under ultrasound. With excellent arterial blood flow
returned, and an .018 micro wire was passed through the needle,
observed enter the abdominal aorta under fluoroscopy. The needle was
removed, and a micropuncture sheath was placed over the wire. The
inner dilator and wire were removed, and an 035 Bentson wire was
advanced under fluoroscopy into the abdominal aorta. The sheath was
removed and a standard 5 French vascular sheath was placed. The
dilator was removed and the sheath was flushed.

Omni Flush catheter was advanced over the Bentson wire to the
abdominal aorta. Angiogram was performed.

Omni Flush catheter was used to navigate a Bentson wire into the
external iliac artery. The Omni Flush catheter was removed and a 90
cm navigate cross catheter was advanced to the external iliac
artery. Right lower extremity angiogram was performed. Never cross
catheter was used to place the Bentson wire into the proximal SFA,
and the 5 French sheath was exchanged for a 45 cm straight to [HOSPITAL]
destination sheath.

With the destination sheath positioned in the distal external iliac
artery, the proximal face of the SFA occlusion was addressed. IV
heparinization was performed with a weight based dose.

The navigate cross catheter was used to navigate across the proximal
face of the occlusion using a combination of a Glidewire and a
Bentson wire. A distal catheter position in the superficial femoral
artery was achieved.

A 300 cm [REDACTED]core wire was then placed into the popliteal segment.
Balloon angioplasty of the SFA occlusion was then performed with a 4
mm x 150 mm standard balloon angioplasty.

Repeat angiogram was performed.

We then treated the diseased segment with a 5 mm x 150 mm
drug-eluting balloon. After deployment of 3 minutes, a more distal
position was achieved for a standard balloon angioplasty at a site
of dense calcium in the SFA segment.

The balloon was then removed and a final angiogram was performed
through the right lower extremity/foot.

Angio-Seal was then deployed for hemostasis after reversal of the
full heparinization with protamine sulfate.

Excellent pulses were confirmed at the right PT and AT points,
improved from the initial.

Patient was experiencing some lumbar back pain during the case,
though otherwise tolerated the procedure well.

No complications were encountered and no significant blood loss.
FINDINGS: Ultrasound demonstrates patent left common femoral artery

Inferior aorta and the pelvic angiogram confirms mild
atherosclerotic changes of the bilateral common iliac arteries
without high-grade stenosis. Right hypogastric arteries patent, with
what appears to be occlusion or critical stenosis of the proximal
left hypogastric artery. Angiogram reveals a proximal occlusion of
the left SFA.

There is occlusion of the proximal third of the right SFA, with a
branch point contributing to collateral filling of the thigh
vessels. No significant common femoral artery atherosclerotic
changes.

Profunda femoris and the circumflex femoral arteries remain patent.

Reconstitution of the right SFA is achieved in the thigh segment. A
second stenosis is evident at a site of calcification at the
proximal adductor canal.

The trifurcation is patent with less than 50% narrowing of the
tibioperoneal trunk secondary to atherosclerotic changes.

After treatment with 4 mm plain balloon angioplasty and 5 mm
drug-eluting balloon angioplasty, there is 0% residual stenosis of
the right SFA. In-line flow to the hindfoot and the midfoot arch Chassidy
did through patent anterior tibial artery and posterior tibial
artery. The patient clearly has small vessel disease of the distal
tibial and the pedal arteries.

Improved AT and PT pulses at the conclusion.

100% improved perfusion through the right SFA after treatment
IMPRESSION: Status post ultrasound-guided access left common femoral artery for
right lower extremity angiogram and treatment of SFA occlusion with
combination of standard balloon angioplasty and drug-eluting balloon
angioplasty, with restoration of inline flow to the foot.

Angio-Seal for hemostasis.

## 2021-08-01 ENCOUNTER — Ambulatory Visit: Payer: Medicare Other | Admitting: Internal Medicine

## 2021-08-18 ENCOUNTER — Encounter: Payer: Medicare Other | Attending: Physician Assistant | Admitting: Physician Assistant

## 2021-08-18 DIAGNOSIS — E11621 Type 2 diabetes mellitus with foot ulcer: Secondary | ICD-10-CM | POA: Insufficient documentation

## 2021-08-18 DIAGNOSIS — I130 Hypertensive heart and chronic kidney disease with heart failure and stage 1 through stage 4 chronic kidney disease, or unspecified chronic kidney disease: Secondary | ICD-10-CM | POA: Diagnosis not present

## 2021-08-18 DIAGNOSIS — L89620 Pressure ulcer of left heel, unstageable: Secondary | ICD-10-CM | POA: Diagnosis not present

## 2021-08-18 DIAGNOSIS — I2510 Atherosclerotic heart disease of native coronary artery without angina pectoris: Secondary | ICD-10-CM | POA: Diagnosis not present

## 2021-08-18 DIAGNOSIS — I872 Venous insufficiency (chronic) (peripheral): Secondary | ICD-10-CM | POA: Diagnosis not present

## 2021-08-18 DIAGNOSIS — L89610 Pressure ulcer of right heel, unstageable: Secondary | ICD-10-CM | POA: Diagnosis not present

## 2021-08-18 DIAGNOSIS — Z87891 Personal history of nicotine dependence: Secondary | ICD-10-CM | POA: Diagnosis not present

## 2021-08-18 DIAGNOSIS — N1831 Chronic kidney disease, stage 3a: Secondary | ICD-10-CM | POA: Diagnosis not present

## 2021-08-18 DIAGNOSIS — E1122 Type 2 diabetes mellitus with diabetic chronic kidney disease: Secondary | ICD-10-CM | POA: Insufficient documentation

## 2021-08-18 DIAGNOSIS — I5042 Chronic combined systolic (congestive) and diastolic (congestive) heart failure: Secondary | ICD-10-CM | POA: Diagnosis not present

## 2021-08-18 DIAGNOSIS — L89890 Pressure ulcer of other site, unstageable: Secondary | ICD-10-CM | POA: Insufficient documentation

## 2021-08-18 DIAGNOSIS — E1151 Type 2 diabetes mellitus with diabetic peripheral angiopathy without gangrene: Secondary | ICD-10-CM | POA: Diagnosis not present

## 2021-08-18 DIAGNOSIS — L89510 Pressure ulcer of right ankle, unstageable: Secondary | ICD-10-CM | POA: Diagnosis not present

## 2021-08-18 NOTE — Progress Notes (Signed)
Sameka, Meenach Kaeleigh H. ([id number]) ?Visit Report for 08/18/2021 ?Chief Complaint Document Details ?Patient Name: Patricia [name], Patricia [name] ?Date of Service: 08/18/2021 12:45 PM ?Medical Record Number: [id number] ?Patient Account Number: [id number] ?Date of Birth/Sex: [birth year] (71 y.o. F) ?Treating RN: Levora Dredge ?Primary Care Provider: Raelene Bott Other Clinician: ?Referring Provider: Raelene Bott ?Treating Provider/Extender: Jeri Cos ?Weeks in Treatment: 0 ?Information Obtained from: Patient ?Chief Complaint ?Bilateral Foot Ulcers ?Electronic Signature(s) ?Signed: 08/18/2021 1:56:38 PM By: Worthy Keeler PA-C ?Entered By: Worthy Keeler on 08/18/2021 13:56:38 ?Patricia [name], Patricia Tawyna H. ([id number]) ?-------------------------------------------------------------------------------- ?HPI Details ?Patient Name: Patricia [name], Patricia [name] ?Date of Service: 08/18/2021 12:45 PM ?Medical Record Number: [id number] ?Patient Account Number: [id number] ?Date of Birth/Sex: March 15, 1951 (71 y.o. F) ?Treating RN: Levora Dredge ?Primary Care Provider: Raelene Bott Other Clinician: ?Referring Provider: Raelene Bott ?Treating Provider/Extender: Jeri Cos ?Weeks in Treatment: 0 ?History of Present Illness ?HPI Description: 08/18/2021 patient presents today for reevaluation here in our clinic concerning issues she has been having with her feet ?bilaterally. She has multiple pressure injuries noted at this point. She was previously seen at [facility] where she did have a work-up ?which included x-rays and identified osteomyelitis likely in the fifth metatarsal base which is consistent with where she does have a wound noted ?at this point as well. The patient does appear to have severe peripheral vascular disease on the right with an ABI of 0.36 in the office and ABI on ?the left of 0.82. Obviously this is concerning for me. Even when I saw her in [place] 1 year ago January 2022 last she had very poor arterial ?flow at that time. She tells me  that she did go for revascularization I honestly cannot find that file anywhere to show where this was done. Again I ?am not saying it was not done I am just not able to find that record at this point. With that being said currently they have been using the Vaseline ?gauze with iodine. Nonetheless I do believe as well that the patient unfortunately has significant peripheral vascular disease which has led to the ?current wounds that she has at this point. ?She does have a history of multiple pressure injuries of her feet bilaterally in the heel and lateral portion of her right foot where there is noted to ?be likely osteomyelitis on x-ray. She had an elevated sed rate at 44 and a C-reactive protein of 26. Again this is often consistent with ?inflammation which could be related to osteomyelitis as well. With that being said the patient also has a history of diabetes mellitus type 2, ?chronic kidney disease stage IIIa, coronary artery disease, chronic venous insufficiency, and congestive heart failure. ?Electronic Signature(s) ?Signed: 08/18/2021 2:25:56 PM By: Worthy Keeler PA-C ?Entered By: Worthy Keeler on 08/18/2021 14:25:56 ?Patricia [name], Patricia Lindamarie H. ([id number]) ?-------------------------------------------------------------------------------- ?Physical Exam Details ?Patient Name: Patricia [name], Patricia [name] ?Date of Service: 08/18/2021 12:45 PM ?Medical Record Number: [id number] ?Patient Account Number: [id number] ?Date of Birth/Sex: February 08, 1951 (71 y.o. F) ?Treating RN: Levora Dredge ?Primary Care Provider: Raelene Bott Other Clinician: ?Referring Provider: Raelene Bott ?Treating Provider/Extender: Jeri Cos ?Weeks in Treatment: 0 ?Constitutional ?sitting or standing blood pressure is within target range for patient.. supine blood pressure is within target range for patient.. pulse regular and ?within target range for patient.Marland Kitchen respirations regular, non-labored and within target range for patient.Marland Kitchen temperature within  target range for ?patient.. Well-nourished and well-hydrated in no acute distress. ?Eyes ?conjunctiva clear no eyelid edema noted. pupils equal round and reactive to light and accommodation. ?Ears,  Nose, Mouth, and Throat ?no gross abnormality of ear auricles or external auditory canals. normal hearing noted during conversation. mucus membranes moist. ?Respiratory ?normal breathing without difficulty. ?Cardiovascular ?Absent posterior tibial and dorsalis pedis pulses bilateral lower extremities. ?Musculoskeletal ?Patient unable to walk without assistance. no significant deformity or arthritic changes, no loss or range of motion, no clubbing. ?Psychiatric ?this patient is able to make decisions and demonstrates good insight into disease process. Alert and Oriented x 3. pleasant and cooperative. ?Notes ?Upon inspection patient's wounds are all eschar covered and dry there does not appear to be any signs of infection right now which is great news. ?Fortunately I do not see any evidence of systemic infection either which is great news. Nonetheless I am concerned about the fact that the patient ?continues to have issues with wounds and again her screening with her arterial study today appears to be very poor in general. I think that she ?really needs vascular work-up and I would recommend referral back to her vascular doctor ASAP. ?Electronic Signature(s) ?Signed: 08/18/2021 2:26:30 PM By: Worthy Keeler PA-C ?Entered By: Worthy Keeler on 08/18/2021 14:26:30 ?Patricia [name], Patricia Beonca H. ([id number]) ?-------------------------------------------------------------------------------- ?Physician Orders Details ?Patient Name: Patricia [name], Patricia [name] ?Date of Service: 08/18/2021 12:45 PM ?Medical Record Number: [id number] ?Patient Account Number: [id number] ?Date of Birth/Sex: [birth year] (71 y.o. F) ?Treating RN: Levora Dredge ?Primary Care Provider: Raelene Bott Other Clinician: ?Referring Provider: Raelene Bott ?Treating  Provider/Extender: Jeri Cos ?Weeks in Treatment: 0 ?Verbal / Phone Orders: No ?Diagnosis Coding ?ICD-10 Coding ?Code Description ?L89.620 Pressure ulcer of left heel, unstageable ?L89.610 Pressure ulcer of right heel, unstageable ?L89.510 Pressure ulcer of right ankle, unstageable ?L89.890 Pressure ulcer of other site, unstageable ?I73.89 Other specified peripheral vascular diseases ?E11.621 Type 2 diabetes mellitus with foot ulcer ?N18.31 Chronic kidney disease, stage 3a ?I25.10 Atherosclerotic heart disease of native coronary artery without angina pectoris ?I87.2 Venous insufficiency (chronic) (peripheral) ?I50.42 Chronic combined systolic (congestive) and diastolic (congestive) heart failure ?Follow-up Appointments ?o Return Appointment in 2 weeks. ?o Nurse Visit as needed ?Bathing/ Shower/ Hygiene ?o May shower; gently cleanse wound with antibacterial soap, rinse and pat dry prior to dressing wounds ?o No tub bath. ?Off-Loading ?o Turn and reposition every 2 hours - please keep heels off bed when laying to relieve pressure ?Wound Treatment ?Wound #1 - Calcaneus Wound Laterality: Left ?Cleanser: Normal Saline 1 x Per Day/30 Days ?Discharge Instructions: Wash your hands with soap and water. Remove old dressing, discard into plastic bag and place into trash. ?Cleanse the wound with Normal Saline prior to applying a clean dressing using gauze sponges, not tissues or cotton balls. Do not scrub ?or use excessive force. Pat dry using gauze sponges, not tissue or cotton balls. ?Cleanser: Soap and Water 1 x Per Day/30 Days ?Discharge Instructions: Gently cleanse wound with antibacterial soap, rinse and pat dry prior to dressing wounds ?Topical: Betadine 1 x Per Day/30 Days ?Discharge Instructions: Apply betadine as directed. ?Primary Dressing: Gauze 1 x Per Day/30 Days ?Discharge Instructions: As directed: dry ?Secondary Dressing: Kerlix 4.5 x 4.1 (in/yd) 1 x Per Day/30 Days ?Discharge Instructions: Apply  Kerlix 4.5 x 4.1 (in/yd) as instructed ?Secured With: Medipore Tape - 35M Medipore H Soft Cloth Surgical Tape, 2x2 (in/yd) 1 x Per Day/30 Days ?Wound #2 - Calcaneus Wound Laterality: Right ?Cleanser: Normal Saline 1 x Per Day/3

## 2021-08-18 NOTE — Progress Notes (Signed)
Delayna, Updyke Shamel H. ([id number]) ?Visit Report for 08/18/2021 ?Abuse Risk Screen Details ?Patient Name: Patricia [name], Patricia [name] ?Date of Service: 08/18/2021 12:45 PM ?Medical Record Number: [id number] ?Patient Account Number: [id number] ?Date of Birth/Sex: [birth year] (71 y.o. F) ?Treating RN: Angelina Pih ?Primary Care Shann Lewellyn: Lindwood Qua Other Clinician: ?Referring Joannah Gitlin: Lindwood Qua ?Treating Yuvan Medinger/Extender: Allen Derry ?Weeks in Treatment: 0 ?Abuse Risk Screen Items ?Answer ?ABUSE RISK SCREEN: ?Has anyone close to you tried to hurt or harm you recentlyo No ?Do you feel uncomfortable with anyone in your familyo No ?Has anyone forced you do things that you didnot want to doo No ?Electronic Signature(s) ?Signed: 08/18/2021 4:27:49 PM By: Angelina Pih ?Entered By: Angelina Pih on 08/18/2021 13:14:22 ?Carlon, Trolinger Zonnie H. ([id number]) ?-------------------------------------------------------------------------------- ?Activities of Daily Living Details ?Patient Name: Patricia [name], Patricia [name] ?Date of Service: 08/18/2021 12:45 PM ?Medical Record Number: [id number] ?Patient Account Number: [id number] ?Date of Birth/Sex: May 28, 1950 (71 y.o. F) ?Treating RN: Angelina Pih ?Primary Care Kensley Valladares: Lindwood Qua Other Clinician: ?Referring Zuleyka Kloc: Lindwood Qua ?Treating Chana Lindstrom/Extender: Allen Derry ?Weeks in Treatment: 0 ?Activities of Daily Living Items ?Answer ?Activities of Daily Living (Please select one for each item) ?Drive Automobile Not Able ?Take Medications Completely Able ?Use Telephone Completely Able ?Care for Appearance Need Assistance ?Use Toilet Need Assistance ?Bath / Shower Need Assistance ?Dress Self Need Assistance ?Feed Self Completely Able ?Walk Need Assistance ?Get In / Out Bed Need Assistance ?Housework Not Able ?Prepare Meals Need Assistance ?Handle Money Need Assistance ?Shop for Self Not Able ?Electronic Signature(s) ?Signed: 08/18/2021 4:27:49 PM By: Angelina Pih ?Entered By: Angelina Pih  on 08/18/2021 13:15:03 ?Kennidi, Reynold Zoi H. ([id number]) ?-------------------------------------------------------------------------------- ?Education Screening Details ?Patient Name: Patricia [name], Patricia [name] ?Date of Service: 08/18/2021 12:45 PM ?Medical Record Number: [id number] ?Patient Account Number: [id number] ?Date of Birth/Sex: [birth year] (72 y.o. F) ?Treating RN: Angelina Pih ?Primary Care Mykal Kirchman: Lindwood Qua Other Clinician: ?Referring Srihitha Tagliaferri: Lindwood Qua ?Treating Zarra Geffert/Extender: Allen Derry ?Weeks in Treatment: 0 ?Learning Preferences/Education Level/Primary Language ?Learning Preference: Explanation, Demonstration, Video, Communication Board, Printed Material ?Preferred Language: English ?Cognitive Barrier ?Language Barrier: No ?Translator Needed: No ?Memory Deficit: No ?Emotional Barrier: No ?Cultural/Religious Beliefs Affecting Medical Care: No ?Physical Barrier ?Impaired Vision: Yes pt states more blurry ?Impaired Hearing: No ?Decreased Hand dexterity: No ?Knowledge/Comprehension ?Knowledge Level: Medium ?Comprehension Level: Medium ?Ability to understand written instructions: Medium ?Ability to understand verbal instructions: Medium ?Motivation ?Anxiety Level: Calm ?Cooperation: Cooperative ?Education Importance: Acknowledges Need ?Interest in Health Problems: Asks Questions ?Perception: Coherent ?Willingness to Engage in Self-Management ?High ?Activities: ?Readiness to Engage in Self-Management ?High ?Activities: ?Electronic Signature(s) ?Signed: 08/18/2021 4:27:49 PM By: Angelina Pih ?Entered By: Angelina Pih on 08/18/2021 13:15:29 ?Chanaya, Caliri Anara H. ([id number]) ?-------------------------------------------------------------------------------- ?Fall Risk Assessment Details ?Patient Name: Patricia [name], Patricia [name] ?Date of Service: 08/18/2021 12:45 PM ?Medical Record Number: [id number] ?Patient Account Number: [id number] ?Date of Birth/Sex: 06-Jul-1950 (71 y.o. F) ?Treating RN: Angelina Pih ?Primary  Care Siara Gorder: Lindwood Qua Other Clinician: ?Referring Scout Gumbs: Lindwood Qua ?Treating Medardo Hassing/Extender: Allen Derry ?Weeks in Treatment: 0 ?Fall Risk Assessment Items ?Have you had 2 or more falls in the last 12 monthso 0 Yes ?Have you had any fall that resulted in injury in the last 12 monthso 0 No ?FALLS RISK SCREEN ?History of falling - immediate or within 3 months 25 Yes ?Secondary diagnosis (Do you have 2 or more medical diagnoseso) 0 No ?Ambulatory aid ?None/bed rest/wheelchair/nurse 0 Yes ?Crutches/cane/walker 0 No ?Furniture 0 No ?Intravenous therapy Access/Saline/Heparin Lock 0 No ?Gait/Transferring ?Normal/ bed rest/ wheelchair 0 Yes ?Weak (short steps with or without shuffle,  stooped but able to lift head while walking, may ?10 Yes ?seek support from furniture) ?Impaired (short steps with shuffle, may have difficulty arising from chair, head down, impaired ?0 No ?balance) ?Mental Status ?Oriented to own ability 0 Yes ?Electronic Signature(s) ?Signed: 08/18/2021 4:27:49 PM By: Angelina PihGordon, Caitlin ?Entered By: Angelina PihGordon, Caitlin on 08/18/2021 13:16:29 ?Patricia [name], [name] ([id number]) ?-------------------------------------------------------------------------------- ?Foot Assessment Details ?Patient Name: Patricia [name], [name] ?Date of Service: 08/18/2021 12:45 PM ?Medical Record Number: [id number] ?Patient Account Number: [id number] ?Date of Birth/Sex: 01/20/1951 (71 y.o. F) ?Treating RN: Angelina PihGordon, Caitlin ?Primary Care Hetty Linhart: Lindwood QuaHoffman, Byron Other Clinician: ?Referring Thos Matsumoto: Lindwood QuaHoffman, Byron ?Treating Leona Pressly/Extender: Allen DerryStone, Hoyt ?Weeks in Treatment: 0 ?Foot Assessment Items ?Site Locations ?+ = Sensation present, - = Sensation absent, C = Callus, U = Ulcer ?R = Redness, W = Warmth, M = Maceration, PU = Pre-ulcerative lesion ?F = Fissure, S = Swelling, D = Dryness ?Assessment ?Right: Left: ?Other Deformity: No No ?Prior Foot Ulcer: No No ?Prior Amputation: No No ?Charcot Joint: No No ?Ambulatory Status:  Ambulatory With Help ?Assistance Device: Wheelchair ?Gait: Unsteady ?Electronic Signature(s) ?Signed: 08/18/2021 4:27:49 PM By: Angelina PihGordon, Caitlin ?Entered By: Angelina PihGordon, Caitlin on 08/18/2021 13:39:32 ?Patricia [name], [name] ([id number]) ?-------------------------------------------------------------------------------- ?Nutrition Risk Screening Details ?Patient Name: Patricia [name], [name] ?Date of Service: 08/18/2021 12:45 PM ?Medical Record Number: [id number] ?Patient Account Number: [id number] ?Date of Birth/Sex: 10/12/1950 (71 y.o. F) ?Treating RN: Angelina PihGordon, Caitlin ?Primary Care Rudy Luhmann: Lindwood QuaHoffman, Byron Other Clinician: ?Referring Minka Knight: Lindwood QuaHoffman, Byron ?Treating Jailene Cupit/Extender: Allen DerryStone, Hoyt ?Weeks in Treatment: 0 ?Height (in): ?Weight (lbs): ?Body Mass Index (BMI): ?Nutrition Risk Screening Items ?Score Screening ?NUTRITION RISK SCREEN: ?I have an illness or condition that made me change the kind and/or amount of food I eat 0 No ?I eat fewer than two meals per day 0 No ?I eat few fruits and vegetables, or milk products 0 No ?I have three or more drinks of beer, liquor or wine almost every day 0 No ?I have tooth or mouth problems that make it hard for me to eat 0 No ?I don't always have enough money to buy the food I need 0 No ?I eat alone most of the time 0 No ?I take three or more different prescribed or over-the-counter drugs a day 0 No ?Without wanting to, I have lost or gained 10 pounds in the last six months 0 No ?I am not always physically able to shop, cook and/or feed myself 0 No ?Nutrition Protocols ?Good Risk Protocol ?Provide education on elevated ?Moderate Risk Protocol 0 blood sugars and impact on ?wound healing, as applicable ?High Risk Proctocol ?Risk Level: Good Risk ?Score: 0 ?Electronic Signature(s) ?Signed: 08/18/2021 4:27:49 PM By: Angelina PihGordon, Caitlin ?Entered By: Angelina PihGordon, Caitlin on 08/18/2021 13:16:52 ?

## 2021-08-18 NOTE — Progress Notes (Signed)
Philis, Doke Adaria H. ([id number]) ?Visit Report for 08/18/2021 ?Allergy List Details ?Patient Name: Patricia [name], Patricia [name] ?Date of Service: 08/18/2021 12:45 PM ?Medical Record Number: [id number] ?Patient Account Number: [id number] ?Date of Birth/Sex: [birth year] (71 y.o. F) ?Treating RN: Angelina Pih ?Primary Care Toryn Dewalt: Lindwood Qua Other Clinician: ?Referring Mattie Nordell: Lindwood Qua ?Treating Catia Todorov/Extender: Allen Derry ?Weeks in Treatment: 0 ?Allergies ?Active Allergies ?hydrocodone ?Allergy Notes ?Electronic Signature(s) ?Signed: 08/18/2021 4:27:49 PM By: Angelina Pih ?Entered By: Angelina Pih on 08/18/2021 13:08:09 ?Briele, Lagasse Mikea H. ([id number]) ?-------------------------------------------------------------------------------- ?Arrival Information Details ?Patient Name: Patricia [name], Patricia [name] ?Date of Service: 08/18/2021 12:45 PM ?Medical Record Number: [id number] ?Patient Account Number: [id number] ?Date of Birth/Sex: [birth year] (71 y.o. F) ?Treating RN: Angelina Pih ?Primary Care Adalyna Godbee: Lindwood Qua Other Clinician: ?Referring Dax Murguia: Lindwood Qua ?Treating Bentzion Dauria/Extender: Allen Derry ?Weeks in Treatment: 0 ?Visit Information ?Patient Arrived: Wheel Chair ?Arrival Time: 13:06 ?Accompanied By: self ?Transfer Assistance: EasyPivot Patient Lift ?Patient Identification Verified: Yes ?Secondary Verification Process Completed: Yes ?Patient Has Alerts: Yes ?Patient Alerts: Patient on Blood Thinner ?type 2 diabetic ?Electronic Signature(s) ?Signed: 08/18/2021 4:06:10 PM By: Angelina Pih ?Entered By: Angelina Pih on 08/18/2021 16:06:10 ?Walter, Min Montserrat H. ([id number]) ?-------------------------------------------------------------------------------- ?Clinic Level of Care Assessment Details ?Patient Name: Patricia [name], Patricia [name] ?Date of Service: 08/18/2021 12:45 PM ?Medical Record Number: [id number] ?Patient Account Number: [id number] ?Date of Birth/Sex: 10-15-1950 (71 y.o. F) ?Treating RN: Angelina Pih ?Primary  Care Samaad Hashem: Lindwood Qua Other Clinician: ?Referring Yardley Lekas: Lindwood Qua ?Treating Ceri Mayer/Extender: Allen Derry ?Weeks in Treatment: 0 ?Clinic Level of Care Assessment Items ?TOOL 2 Quantity Score ?  - Use when only an EandM is performed on the INITIAL visit 0 ?ASSESSMENTS - Nursing Assessment / Reassessment ?  - General Physical Exam (combine w/ comprehensive assessment (listed just below) when performed on new ?0 ?pt. evals) ?X- 1 25 ?Comprehensive Assessment (HX, ROS, Risk Assessments, Wounds Hx, etc.) ?ASSESSMENTS - Wound and Skin Assessment / Reassessment ?  - Simple Wound Assessment / Reassessment - one wound 0 ?X- 4 5 ?Complex Wound Assessment / Reassessment - multiple wounds ?  - 0 ?Dermatologic / Skin Assessment (not related to wound area) ?ASSESSMENTS - Ostomy and/or Continence Assessment and Care ?  - Incontinence Assessment and Management 0 ?  - 0 ?Ostomy Care Assessment and Management (repouching, etc.) ?PROCESS - Coordination of Care ?  - Simple Patient / Family Education for ongoing care 0 ?X- 1 20 ?Complex (extensive) Patient / Family Education for ongoing care ?  - 0 ?Staff obtains Consents, Records, Test Results / Process Orders ?  - 0 ?Staff telephones HHA, Nursing Homes / Clarify orders / etc ?  - 0 ?Routine Transfer to another Facility (non-emergent condition) ?  - 0 ?Routine Hospital Admission (non-emergent condition) ?  - 0 ?New Admissions / Manufacturing engineer / Ordering NPWT, Apligraf, etc. ?  - 0 ?Emergency Hospital Admission (emergent condition) ?  - 0 ?Simple Discharge Coordination ?X- 1 15 ?Complex (extensive) Discharge Coordination ?PROCESS - Special Needs ?  - Pediatric / Minor Patient Management 0 ?  - 0 ?Isolation Patient Management ?  - 0 ?Hearing / Language / Visual special needs ?  - 0 ?Assessment of Community assistance (transportation, D/C planning, etc.) ?  - 0 ?Additional assistance / Altered mentation ?  - 0 ?Support Surface(s) Assessment  (bed, cushion, seat, etc.) ?INTERVENTIONS - Wound Cleansing / Measurement ?X - Wound Imaging (photographs - any number of wounds) 1 5 ?  - 0 ?Wound Tracing (instead of photographs) ?  - 0 ?Simple Wound Measurement - one wound ?X- 4 5 ?Complex Wound Measurement - multiple wounds ?Patricia [name], Patricia Kiarah H. ([id number]) ?  - 0 ?Simple Wound Cleansing -  one wound ?X- 4 5 ?Complex Wound Cleansing - multiple wounds ?INTERVENTIONS - Wound Dressings ?X - Small Wound Dressing one or multiple wounds 4 10 ?[]  - 0 ?Medium Wound Dressing one or multiple wounds ?[]  - 0 ?Large Wound Dressing one or multiple wounds ?[]  - 0 ?Application of Medications - injection ?INTERVENTIONS - Miscellaneous ?[]  - External ear exam 0 ?[]  - 0 ?Specimen Collection (cultures, biopsies, blood, body fluids, etc.) ?[]  - 0 ?Specimen(s) / Culture(s) sent or taken to Lab for analysis ?[]  - 0 ?Patient Transfer (multiple staff / Nurse, adultHoyer Lift / Similar devices) ?[]  - 0 ?Simple Staple / Suture removal (25 or less) ?[]  - 0 ?Complex Staple / Suture removal (26 or more) ?[]  - 0 ?Hypo / Hyperglycemic Management (close monitor of Blood Glucose) ?X- 1 15 ?Ankle / Brachial Index (ABI) - do not check if billed separately ?Has the patient been seen at the hospital within the last three years: Yes ?Total Score: 180 ?Level Of Care: New/Established - Level ?5 ?Electronic Signature(s) ?Signed: 08/18/2021 4:27:49 PM By: Angelina PihGordon, Caitlin ?Entered By: Angelina PihGordon, Caitlin on 08/18/2021 14:43:15 ?Patricia [name], [name] ([id number]) ?-------------------------------------------------------------------------------- ?Encounter Discharge Information Details ?Patient Name: Patricia [name], [name] ?Date of Service: 08/18/2021 12:45 PM ?Medical Record Number: [id number] ?Patient Account Number: [id number] ?Date of Birth/Sex: 03/05/1951 (71 y.o. F) ?Treating RN: Angelina PihGordon, Caitlin ?Primary Care Su Duma: Lindwood QuaHoffman, Byron Other Clinician: ?Referring Euell Schiff: Lindwood QuaHoffman, Byron ?Treating Makalia Bare/Extender: Allen DerryStone,  Hoyt ?Weeks in Treatment: 0 ?Encounter Discharge Information Items ?Discharge Condition: Stable ?Ambulatory Status: Wheelchair ?Discharge Destination: Skilled Nursing Facility ?Telephoned: No ?Orders Sent: Yes ?Transportation: Other ?Accompanied By: self ?Schedule Follow-up Appointment: Yes ?Clinical Summary of Care: ?Electronic Signature(s) ?Signed: 08/18/2021 2:44:36 PM By: Angelina PihGordon, Caitlin ?Entered By: Angelina PihGordon, Caitlin on 08/18/2021 14:44:36 ?Patricia [name], [name] ([id number]) ?-------------------------------------------------------------------------------- ?Lower Extremity Assessment Details ?Patient Name: Patricia [name], Cosandra H. ?Date of Service: 08/18/2021 12:45 PM ?Medical Record Number: [id number] ?Patient Account Number: [id number] ?Date of Birth/Sex: 05/28/1950 (71 y.o. F) ?Treating RN: Angelina PihGordon, Caitlin ?Primary Care Fallan Mccarey: Lindwood QuaHoffman, Byron Other Clinician: ?Referring Joncarlo Friberg: Lindwood QuaHoffman, Byron ?Treating Akira Adelsberger/Extender: Allen DerryStone, Hoyt ?Weeks in Treatment: 0 ?Vascular Assessment ?Blood Pressure: ?Brachial: [Left:107] [Right:110] ?Dorsalis Pedis: 50 ?[Left:Dorsalis Pedis: 40] ?Ankle: ?Posterior Tibial: 90 ?[Left:Posterior Tibial: 40 0.82] [Right:0.36] ?Electronic Signature(s) ?Signed: 08/18/2021 4:27:49 PM By: Angelina PihGordon, Caitlin ?Entered By: Angelina PihGordon, Caitlin on 08/18/2021 13:37:29 ?Patricia [name], [name] ([id number]) ?-------------------------------------------------------------------------------- ?Multi Wound Chart Details ?Patient Name: Patricia [name], Tevis H. ?Date of Service: 08/18/2021 12:45 PM ?Medical Record Number: [id number] ?Patient Account Number: [id number] ?Date of Birth/Sex: 07/23/1950 (71 y.o. F) ?Treating RN: Angelina PihGordon, Caitlin ?Primary Care Abayomi Pattison: Lindwood QuaHoffman, Byron Other Clinician: ?Referring Jerusha Reising: Lindwood QuaHoffman, Byron ?Treating Numair Masden/Extender: Allen DerryStone, Hoyt ?Weeks in Treatment: 0 ?Vital Signs ?Height(in): 64 ?Pulse(bpm): 70 ?Weight(lbs): 142 ?Blood Pressure(mmHg): 107/67 ?Body Mass Index(BMI): 24.4 ?Temperature(??F):  97.7 ?Respiratory Rate(breaths/min): 18 ?Photos: ?Wound Location: Left Calcaneus Right Calcaneus Right, Lateral Ankle ?Wounding Event: Pressure Injury Pressure Injury Pressure Injury ?Primary Etiology: Pressure Ulcer Pressur

## 2021-09-01 ENCOUNTER — Ambulatory Visit: Payer: Medicare Other | Admitting: Physician Assistant

## 2021-09-12 ENCOUNTER — Encounter: Payer: Medicare Other | Attending: Physician Assistant | Admitting: Physician Assistant

## 2021-09-12 DIAGNOSIS — L89620 Pressure ulcer of left heel, unstageable: Secondary | ICD-10-CM | POA: Insufficient documentation

## 2021-09-12 DIAGNOSIS — I872 Venous insufficiency (chronic) (peripheral): Secondary | ICD-10-CM | POA: Diagnosis not present

## 2021-09-12 DIAGNOSIS — L89510 Pressure ulcer of right ankle, unstageable: Secondary | ICD-10-CM | POA: Diagnosis not present

## 2021-09-12 DIAGNOSIS — E1151 Type 2 diabetes mellitus with diabetic peripheral angiopathy without gangrene: Secondary | ICD-10-CM | POA: Insufficient documentation

## 2021-09-12 DIAGNOSIS — E1122 Type 2 diabetes mellitus with diabetic chronic kidney disease: Secondary | ICD-10-CM | POA: Insufficient documentation

## 2021-09-12 DIAGNOSIS — L89610 Pressure ulcer of right heel, unstageable: Secondary | ICD-10-CM | POA: Insufficient documentation

## 2021-09-12 DIAGNOSIS — N1831 Chronic kidney disease, stage 3a: Secondary | ICD-10-CM | POA: Diagnosis not present

## 2021-09-12 DIAGNOSIS — I2510 Atherosclerotic heart disease of native coronary artery without angina pectoris: Secondary | ICD-10-CM | POA: Diagnosis not present

## 2021-09-12 DIAGNOSIS — L89890 Pressure ulcer of other site, unstageable: Secondary | ICD-10-CM | POA: Insufficient documentation

## 2021-09-12 DIAGNOSIS — I5042 Chronic combined systolic (congestive) and diastolic (congestive) heart failure: Secondary | ICD-10-CM | POA: Insufficient documentation

## 2021-09-12 NOTE — Progress Notes (Addendum)
Patricia [name], Patricia [name] [name] ([id number]) ?Visit Report for 09/12/2021 ?Chief Complaint Document Details ?Patient Name: Patricia [name], Patricia [name] ?Date of Service: 09/12/2021 10:45 AM ?Medical Record Number: [id number] ?Patient Account Number: [id number] ?Date of Birth/Sex: [birth year] (71 y.o. F) ?Treating RN: Donnamarie Poag ?Primary Care Provider: Raelene Bott Other Clinician: ?Referring Provider: Raelene Bott ?Treating Provider/Extender: Jeri Cos ?Weeks in Treatment: 3 ?Information Obtained from: Patient ?Chief Complaint ?Bilateral Foot Ulcers ?Electronic Signature(s) ?Signed: 09/12/2021 10:29:02 AM By: Worthy Keeler PA-C ?Entered By: Worthy Keeler on 09/12/2021 10:29:01 ?Patricia [name], Patricia Ivanka H. ([id number]) ?-------------------------------------------------------------------------------- ?HPI Details ?Patient Name: Patricia [name] ?Date of Service: 09/12/2021 10:45 AM ?Medical Record Number: [id number] ?Patient Account Number: [id number] ?Date of Birth/Sex: 1950/06/01 (71 y.o. F) ?Treating RN: Donnamarie Poag ?Primary Care Provider: Raelene Bott Other Clinician: ?Referring Provider: Raelene Bott ?Treating Provider/Extender: Jeri Cos ?Weeks in Treatment: 3 ?History of Present Illness ?HPI Description: 08/18/2021 patient presents today for reevaluation here in our clinic concerning issues she has been having with her feet ?bilaterally. She has multiple pressure injuries noted at this point. She was previously seen at [facility] where she did have a work-up ?which included x-rays and identified osteomyelitis likely in the fifth metatarsal base which is consistent with where she does have a wound noted ?at this point as well. The patient does appear to have severe peripheral vascular disease on the right with an ABI of 0.36 in the office and ABI on ?the left of 0.82. Obviously this is concerning for me. Even when I saw her in [place] 1 year ago January 2022 last she had very poor arterial ?flow at that time. She tells me that she  did go for revascularization I honestly cannot find that file anywhere to show where this was done. Again I ?am not saying it was not done I am just not able to find that record at this point. With that being said currently they have been using the Vaseline ?gauze with iodine. Nonetheless I do believe as well that the patient unfortunately has significant peripheral vascular disease which has led to the ?current wounds that she has at this point. ?She does have a history of multiple pressure injuries of her feet bilaterally in the heel and lateral portion of her right foot where there is noted to ?be likely osteomyelitis on x-ray. She had an elevated sed rate at 44 and a C-reactive protein of 26. Again this is often consistent with ?inflammation which could be related to osteomyelitis as well. With that being said the patient also has a history of diabetes mellitus type 2, ?chronic kidney disease stage IIIa, coronary artery disease, chronic venous insufficiency, and congestive heart failure. ?09-12-2021 upon evaluation today patient appears to be doing well currently in regard to her wounds all things considered. Unfortunately she has ?not had pretty much anything that was recommended 3 weeks ago when she was here done with the facility. She was supposed to have a ?evaluation with vascular that has not been scheduled as far as I can tell. She was also supposed to have an MRI of the right foot also this has not ?been done as best I can tell. She was supposed to be having Betadine applied to the foot followed by dry gauze and a lightly applied roll gauze to ?secure in place. Again this has also not been done. Again I am not really certain what exactly is going on and why there is this delay. I discussed ?this with the patient today she is very frustrated as  well. ?Electronic Signature(s) ?Signed: 09/12/2021 11:18:43 AM By: Worthy Keeler PA-C ?Entered By: Worthy Keeler on 09/12/2021 11:18:42 ?Patricia [name], Patricia Sinda H.  ([id number]) ?-------------------------------------------------------------------------------- ?Physical Exam Details ?Patient Name: Patricia [name], Patricia [name] ?Date of Service: 09/12/2021 10:45 AM ?Medical Record Number: [id number] ?Patient Account Number: [id number] ?Date of Birth/Sex: 1950/10/28 (71 y.o. F) ?Treating RN: Donnamarie Poag ?Primary Care Provider: Raelene Bott Other Clinician: ?Referring Provider: Raelene Bott ?Treating Provider/Extender: Jeri Cos ?Weeks in Treatment: 3 ?Constitutional ?Well-nourished and well-hydrated in no acute distress. ?Respiratory ?normal breathing without difficulty. ?Psychiatric ?this patient is able to make decisions and demonstrates good insight into disease process. Alert and Oriented x 3. pleasant and cooperative. ?Notes ?Upon inspection patient's wound bed actually showed signs of eschar still noted at this point. For that reason I really do think that she still would ?benefit from the use of the Betadine as we have been doing. With that being said I am still concerned about the overall coloring of her skin I do ?believe in the lower extremities this is indicative of some vascular compromise she needs to see vascular ASAP. ?Electronic Signature(s) ?Signed: 09/12/2021 11:19:29 AM By: Worthy Keeler PA-C ?Entered By: Worthy Keeler on 09/12/2021 11:19:28 ?Patricia [name], Patricia Katerina H. ([id number]) ?-------------------------------------------------------------------------------- ?Physician Orders Details ?Patient Name: Patricia [name], Patricia [name] ?Date of Service: 09/12/2021 10:45 AM ?Medical Record Number: [id number] ?Patient Account Number: [id number] ?Date of Birth/Sex: 1950-09-08 (71 y.o. F) ?Treating RN: Donnamarie Poag ?Primary Care Provider: Raelene Bott Other Clinician: ?Referring Provider: Raelene Bott ?Treating Provider/Extender: Jeri Cos ?Weeks in Treatment: 3 ?Verbal / Phone Orders: No ?Diagnosis Coding ?ICD-10 Coding ?Code Description ?L89.620 Pressure ulcer of left heel, unstageable ?L89.610  Pressure ulcer of right heel, unstageable ?L89.510 Pressure ulcer of right ankle, unstageable ?L89.890 Pressure ulcer of other site, unstageable ?I73.89 Other specified peripheral vascular diseases ?E11.621 Type 2 diabetes mellitus with foot ulcer ?N18.31 Chronic kidney disease, stage 3a ?I25.10 Atherosclerotic heart disease of native coronary artery without angina pectoris ?I87.2 Venous insufficiency (chronic) (peripheral) ?I50.42 Chronic combined systolic (congestive) and diastolic (congestive) heart failure ?Follow-up Appointments ?o Return Appointment in 2 weeks. ?o Nurse Visit as needed ?Bathing/ Shower/ Hygiene ?o May shower; gently cleanse wound with antibacterial soap, rinse and pat dry prior to dressing wounds ?o No tub bath. ?Anesthetic (Use 'Patient Medications' Section for Anesthetic Order Entry) ?o Lidocaine applied to wound bed ?Edema Control - Lymphedema / Segmental Compressive Device / Other ?o DO YOUR BEST to sleep in the bed at night. DO NOT sleep in your recliner. Long hours of sitting in a recliner leads to ?swelling of the legs and/or potential wounds on your backside. ?Off-Loading ?o Turn and reposition every 2 hours - please keep heels off bed when laying to relieve pressure ?Wound Treatment ?Wound #1 - Calcaneus Wound Laterality: Left ?Cleanser: Normal Saline 1 x Per Day/30 Days ?Discharge Instructions: Wash your hands with soap and water. Remove old dressing, discard into plastic bag and place into trash. ?Cleanse the wound with Normal Saline prior to applying a clean dressing using gauze sponges, not tissues or cotton balls. Do not scrub ?or use excessive force. Pat dry using gauze sponges, not tissue or cotton balls. ?Cleanser: Soap and Water 1 x Per Day/30 Days ?Discharge Instructions: Gently cleanse wound with antibacterial soap, rinse and pat dry prior to dressing wounds ?Topical: Betadine 1 x Per Day/30 Days ?Discharge Instructions: Apply betadine as directed. ?Primary  Dressing: Gauze 1 x Per Day/30 Days ?Discharge Instructions: As directed: dry ?Secondary Dressing: Kerlix 4.5 x 4.1 (in/yd)  1 x Per Day/30 Days ?Discharge Instructions: Apply Kerlix 4.5 x 4.1 (in/yd) as instr

## 2021-09-12 NOTE — Progress Notes (Addendum)
Patricia [name], Berndt [name] ([id number]) ?Visit Report for 09/12/2021 ?Arrival Information Details ?Patient Name: Patricia [name], Patricia [name] ?Date of Service: 09/12/2021 10:45 AM ?Medical Record Number: [id number] ?Patient Account Number: [id number] ?Date of Birth/Sex: 10/14/1950 (71 y.o. F) ?Treating RN: Donnamarie Poag ?Primary Care Jeremiah Tarpley: Raelene Bott Other Clinician: ?Referring Allanna Bresee: Raelene Bott ?Treating Artha Chiasson/Extender: Jeri Cos ?Weeks in Treatment: 3 ?Visit Information History Since Last Visit ?Added or deleted any medications: No ?Patient Arrived: Wheel Chair ?Had a fall or experienced change in No ?Arrival Time: 10:44 ?activities of daily living that may affect ?Accompanied By: self ?risk of falls: ?Transfer Assistance: Manual ?Hospitalized since last visit: No ?Patient Identification Verified: Yes ?Has Dressing in Place as Prescribed: No ?Secondary Verification Process Completed: Yes ?Pain Present Now: Yes ?Patient Requires Transmission-Based No ?Precautions: ?Patient Has Alerts: Yes ?Patient Alerts: Patient on Blood ?Thinner ?type 2 diabetic ?ELIQUIS ?[place] ?Electronic Signature(s) ?Signed: 09/12/2021 3:45:15 PM By: Donnamarie Poag ?Entered ByDonnamarie Poag on 09/12/2021 11:08:38 ?Chia, Dahlman Sri H. ([id number]) ?-------------------------------------------------------------------------------- ?Clinic Level of Care Assessment Details ?Patient Name: Patricia [name], Patricia [name] ?Date of Service: 09/12/2021 10:45 AM ?Medical Record Number: [id number] ?Patient Account Number: [id number] ?Date of Birth/Sex: Oct 11, 1950 (71 y.o. F) ?Treating RN: Donnamarie Poag ?Primary Care Maalle Starrett: Raelene Bott Other Clinician: ?Referring Erik Nessel: Raelene Bott ?Treating Kenza Munar/Extender: Jeri Cos ?Weeks in Treatment: 3 ?Clinic Level of Care Assessment Items ?TOOL 4 Quantity Score ?[]  - Use when only an EandM is performed on FOLLOW-UP visit 0 ?ASSESSMENTS - Nursing Assessment / Reassessment ?[]  - Reassessment of Co-morbidities (includes updates  in patient status) 0 ?[]  - 0 ?Reassessment of Adherence to Treatment Plan ?ASSESSMENTS - Wound and Skin Assessment / Reassessment ?[]  - Simple Wound Assessment / Reassessment - one wound 0 ?X- 4 5 ?Complex Wound Assessment / Reassessment - multiple wounds ?[]  - 0 ?Dermatologic / Skin Assessment (not related to wound area) ?ASSESSMENTS - Focused Assessment ?[]  - Circumferential Edema Measurements - multi extremities 0 ?[]  - 0 ?Nutritional Assessment / Counseling / Intervention ?[]  - 0 ?Lower Extremity Assessment (monofilament, tuning fork, pulses) ?[]  - 0 ?Peripheral Arterial Disease Assessment (using hand held doppler) ?ASSESSMENTS - Ostomy and/or Continence Assessment and Care ?[]  - Incontinence Assessment and Management 0 ?[]  - 0 ?Ostomy Care Assessment and Management (repouching, etc.) ?PROCESS - Coordination of Care ?X - Simple Patient / Family Education for ongoing care 1 15 ?[]  - 0 ?Complex (extensive) Patient / Family Education for ongoing care ?X- 1 10 ?Staff obtains Consents, Records, Test Results / Process Orders ?X- 1 10 ?Staff telephones HHA, Nursing Homes / Clarify orders / etc ?[]  - 0 ?Routine Transfer to another Facility (non-emergent condition) ?[]  - 0 ?Routine Hospital Admission (non-emergent condition) ?[]  - 0 ?New Admissions / Biomedical engineer / Ordering NPWT, Apligraf, etc. ?[]  - 0 ?Emergency Hospital Admission (emergent condition) ?X- 1 10 ?Simple Discharge Coordination ?[]  - 0 ?Complex (extensive) Discharge Coordination ?PROCESS - Special Needs ?[]  - Pediatric / Minor Patient Management 0 ?[]  - 0 ?Isolation Patient Management ?[]  - 0 ?Hearing / Language / Visual special needs ?[]  - 0 ?Assessment of Community assistance (transportation, D/C planning, etc.) ?[]  - 0 ?Additional assistance / Altered mentation ?[]  - 0 ?Support Surface(s) Assessment (bed, cushion, seat, etc.) ?INTERVENTIONS - Wound Cleansing / Measurement ?Renoda, Loury Mary H. ([id number]) ?[]  - 0 ?Simple Wound Cleansing - one  wound ?X- 4 5 ?Complex Wound Cleansing - multiple wounds ?X- 1 5 ?Wound Imaging (photographs - any number of wounds) ?[]  - 0 ?Wound Tracing (instead of photographs) ?[]  - 0 ?  Simple Wound Measurement - one wound ?X- 4 5 ?Complex Wound Measurement - multiple wounds ?INTERVENTIONS - Wound Dressings ?X - Small Wound Dressing one or multiple wounds 4 10 ?[]  - 0 ?Medium Wound Dressing one or multiple wounds ?[]  - 0 ?Large Wound Dressing one or multiple wounds ?X- 1 5 ?Application of Medications - topical ?[]  - 0 ?Application of Medications - injection ?INTERVENTIONS - Miscellaneous ?[]  - External ear exam 0 ?[]  - 0 ?Specimen Collection (cultures, biopsies, blood, body fluids, etc.) ?[]  - 0 ?Specimen(s) / Culture(s) sent or taken to Lab for analysis ?[]  - 0 ?Patient Transfer (multiple staff / Civil Service fast streamer / Similar devices) ?[]  - 0 ?Simple Staple / Suture removal (25 or less) ?[]  - 0 ?Complex Staple / Suture removal (26 or more) ?[]  - 0 ?Hypo / Hyperglycemic Management (close monitor of Blood Glucose) ?[]  - 0 ?Ankle / Brachial Index (ABI) - do not check if billed separately ?X- 1 5 ?Vital Signs ?Has the patient been seen at the hospital within the last three years: Yes ?Total Score: 160 ?Level Of Care: New/Established - Level ?5 ?Electronic Signature(s) ?Signed: 09/12/2021 3:45:15 PM By: Donnamarie Poag ?Entered ByDonnamarie Poag on 09/12/2021 11:18:38 ?Kissa, Hoole Jazzlynn H. ([id number]) ?-------------------------------------------------------------------------------- ?Encounter Discharge Information Details ?Patient Name: Patricia [name], Patricia [name] ?Date of Service: 09/12/2021 10:45 AM ?Medical Record Number: [id number] ?Patient Account Number: [id number] ?Date of Birth/Sex: 1950/07/02 (71 y.o. F) ?Treating RN: Donnamarie Poag ?Primary Care Latoyia Tecson: Raelene Bott Other Clinician: ?Referring Salisa Broz: Raelene Bott ?Treating Roiza Wiedel/Extender: Jeri Cos ?Weeks in Treatment: 3 ?Encounter Discharge Information Items ?Discharge Condition:  Stable ?Ambulatory Status: Wheelchair ?Discharge Destination: [place] ?Telephoned: No ?Orders Sent: Yes ?Transportation: Other ?Accompanied By: self ?Schedule Follow-up Appointment: Yes ?Clinical Summary of Care: ?Electronic Signature(s) ?Signed: 09/12/2021 3:45:15 PM By: Donnamarie Poag ?Entered ByDonnamarie Poag on 09/12/2021 11:30:17 ?Dina, Condrey Jeilyn H. ([id number]) ?-------------------------------------------------------------------------------- ?Lower Extremity Assessment Details ?Patient Name: Patricia [name], Patricia [name] ?Date of Service: 09/12/2021 10:45 AM ?Medical Record Number: [id number] ?Patient Account Number: [id number] ?Date of Birth/Sex: 14-Dec-1950 (71 y.o. F) ?Treating RN: Donnamarie Poag ?Primary Care Anne Boltz: Raelene Bott Other Clinician: ?Referring Demeshia Sherburne: Raelene Bott ?Treating Aowyn Rozeboom/Extender: Jeri Cos ?Weeks in Treatment: 3 ?Edema Assessment ?Assessed: [Left: Yes] [Right: Yes] ?[Left: Edema] [Right: :] ?Ankle ?Left: Right: ?Point of Measurement: From Medial Instep 20.4 cm 20.4 cm ?Electronic Signature(s) ?Signed: 09/12/2021 3:45:15 PM By: Donnamarie Poag ?Entered ByDonnamarie Poag on 09/12/2021 10:54:35 ?Vandana, Snelling Shazia H. ([id number]) ?-------------------------------------------------------------------------------- ?Multi Wound Chart Details ?Patient Name: Patricia [name], Patricia [name] ?Date of Service: 09/12/2021 10:45 AM ?Medical Record Number: [id number] ?Patient Account Number: [id number] ?Date of Birth/Sex: Oct 30, 1950 (71 y.o. F) ?Treating RN: Donnamarie Poag ?Primary Care Jesua Tamblyn: Raelene Bott Other Clinician: ?Referring Harshaan Whang: Raelene Bott ?Treating Pamlea Finder/Extender: Jeri Cos ?Weeks in Treatment: 3 ?Vital Signs ?Height(in): 64 ?Pulse(bpm): 67 ?Weight(lbs): 142 ?Blood Pressure(mmHg): 111/70 ?Body Mass Index(BMI): 24.4 ?Temperature(??F): 97.1 ?Respiratory Rate(breaths/min): 16 ?Photos: ?Wound Location: Left Calcaneus Right Calcaneus Right, Lateral Ankle ?Wounding Event: Pressure Injury Pressure  Injury Pressure Injury ?Primary Etiology: Pressure Ulcer Pressure Ulcer Pressure Ulcer ?Secondary Etiology: N/A Diabetic Wound/Ulcer of the Lower Diabetic Wound/Ulcer of the Lower ?Extremity Extremity ?Comorbid Histor

## 2021-09-26 ENCOUNTER — Encounter: Payer: Medicare Other | Attending: Internal Medicine | Admitting: Internal Medicine

## 2021-09-26 DIAGNOSIS — I5042 Chronic combined systolic (congestive) and diastolic (congestive) heart failure: Secondary | ICD-10-CM | POA: Insufficient documentation

## 2021-09-26 DIAGNOSIS — L89620 Pressure ulcer of left heel, unstageable: Secondary | ICD-10-CM | POA: Diagnosis not present

## 2021-09-26 DIAGNOSIS — E1140 Type 2 diabetes mellitus with diabetic neuropathy, unspecified: Secondary | ICD-10-CM | POA: Diagnosis not present

## 2021-09-26 DIAGNOSIS — L89890 Pressure ulcer of other site, unstageable: Secondary | ICD-10-CM | POA: Diagnosis not present

## 2021-09-26 DIAGNOSIS — E11621 Type 2 diabetes mellitus with foot ulcer: Secondary | ICD-10-CM | POA: Diagnosis not present

## 2021-09-26 DIAGNOSIS — I2510 Atherosclerotic heart disease of native coronary artery without angina pectoris: Secondary | ICD-10-CM | POA: Diagnosis not present

## 2021-09-26 DIAGNOSIS — E1122 Type 2 diabetes mellitus with diabetic chronic kidney disease: Secondary | ICD-10-CM | POA: Diagnosis not present

## 2021-09-26 DIAGNOSIS — N1831 Chronic kidney disease, stage 3a: Secondary | ICD-10-CM | POA: Insufficient documentation

## 2021-09-26 DIAGNOSIS — M069 Rheumatoid arthritis, unspecified: Secondary | ICD-10-CM | POA: Diagnosis not present

## 2021-09-26 DIAGNOSIS — I130 Hypertensive heart and chronic kidney disease with heart failure and stage 1 through stage 4 chronic kidney disease, or unspecified chronic kidney disease: Secondary | ICD-10-CM | POA: Insufficient documentation

## 2021-09-26 DIAGNOSIS — E1151 Type 2 diabetes mellitus with diabetic peripheral angiopathy without gangrene: Secondary | ICD-10-CM | POA: Insufficient documentation

## 2021-09-26 DIAGNOSIS — L89510 Pressure ulcer of right ankle, unstageable: Secondary | ICD-10-CM | POA: Insufficient documentation

## 2021-09-26 DIAGNOSIS — L89610 Pressure ulcer of right heel, unstageable: Secondary | ICD-10-CM | POA: Diagnosis not present

## 2021-09-26 NOTE — Progress Notes (Signed)
Patricia [name], Patricia Breah H. ([id number]) ?Visit Report for 09/26/2021 ?Arrival Information Details ?Patient Name: Patricia [name], Patricia [name] ?Date of Service: 09/26/2021 11:30 AM ?Medical Record Number: [id number] ?Patient Account Number: [id number] ?Date of Birth/Sex: 1951-03-05 (71 y.o. F) ?Treating RN: Donnamarie Poag ?Primary Care Nahom Carfagno: Raelene Bott Other Clinician: ?Referring Caston Coopersmith: Raelene Bott ?Treating Ronson Hagins/Extender: Ricard Dillon ?Weeks in Treatment: 5 ?Visit Information History Since Last Visit ?Added or deleted any medications: No ?Patient Arrived: Wheel Chair ?Had a fall or experienced change in No ?Arrival Time: 11:47 ?activities of daily living that may affect ?Accompanied By: self ?risk of falls: ?Transfer Assistance: [employer] Patient Lift ?Hospitalized since last visit: No ?Patient Identification Verified: Yes ?Has Dressing in Place as Prescribed: Yes ?Secondary Verification Process Completed: Yes ?Pain Present Now: No ?Patient Requires Transmission-Based No ?Precautions: ?Patient Has Alerts: Yes ?Patient Alerts: Patient on Blood ?Thinner ?type 2 diabetic ?ELIQUIS ?[place] ?Electronic Signature(s) ?Signed: 09/26/2021 4:56:25 PM By: Donnamarie Poag ?Entered ByDonnamarie Poag on 09/26/2021 11:48:04 ?Patricia [name], Patricia Lenix H. ([id number]) ?-------------------------------------------------------------------------------- ?Clinic Level of Care Assessment Details ?Patient Name: Patricia [name], Patricia [name] ?Date of Service: 09/26/2021 11:30 AM ?Medical Record Number: [id number] ?Patient Account Number: [id number] ?Date of Birth/Sex: 27-Apr-1951 (71 y.o. F) ?Treating RN: Donnamarie Poag ?Primary Care Meka Lewan: Raelene Bott Other Clinician: ?Referring Analyn Matusek: Raelene Bott ?Treating Sacha Topor/Extender: Ricard Dillon ?Weeks in Treatment: 5 ?Clinic Level of Care Assessment Items ?TOOL 4 Quantity Score ?[]  - Use when only an EandM is performed on FOLLOW-UP visit 0 ?ASSESSMENTS - Nursing Assessment / Reassessment ?[]  - Reassessment of  Co-morbidities (includes updates in patient status) 0 ?[]  - 0 ?Reassessment of Adherence to Treatment Plan ?ASSESSMENTS - Wound and Skin Assessment / Reassessment ?[]  - Simple Wound Assessment / Reassessment - one wound 0 ?X- 5 5 ?Complex Wound Assessment / Reassessment - multiple wounds ?[]  - 0 ?Dermatologic / Skin Assessment (not related to wound area) ?ASSESSMENTS - Focused Assessment ?[]  - Circumferential Edema Measurements - multi extremities 0 ?[]  - 0 ?Nutritional Assessment / Counseling / Intervention ?[]  - 0 ?Lower Extremity Assessment (monofilament, tuning fork, pulses) ?[]  - 0 ?Peripheral Arterial Disease Assessment (using hand held doppler) ?ASSESSMENTS - Ostomy and/or Continence Assessment and Care ?[]  - Incontinence Assessment and Management 0 ?[]  - 0 ?Ostomy Care Assessment and Management (repouching, etc.) ?PROCESS - Coordination of Care ?X - Simple Patient / Family Education for ongoing care 1 15 ?[]  - 0 ?Complex (extensive) Patient / Family Education for ongoing care ?[]  - 0 ?Staff obtains Consents, Records, Test Results / Process Orders ?X- 1 10 ?Staff telephones HHA, Nursing Homes / Clarify orders / etc ?[]  - 0 ?Routine Transfer to another Facility (non-emergent condition) ?[]  - 0 ?Routine Hospital Admission (non-emergent condition) ?[]  - 0 ?New Admissions / Biomedical engineer / Ordering NPWT, Apligraf, etc. ?[]  - 0 ?Emergency Hospital Admission (emergent condition) ?X- 1 10 ?Simple Discharge Coordination ?[]  - 0 ?Complex (extensive) Discharge Coordination ?PROCESS - Special Needs ?[]  - Pediatric / Minor Patient Management 0 ?[]  - 0 ?Isolation Patient Management ?[]  - 0 ?Hearing / Language / Visual special needs ?[]  - 0 ?Assessment of Community assistance (transportation, D/C planning, etc.) ?[]  - 0 ?Additional assistance / Altered mentation ?[]  - 0 ?Support Surface(s) Assessment (bed, cushion, seat, etc.) ?INTERVENTIONS - Wound Cleansing / Measurement ?Patricia [name], Patricia Ka H. ([id number]) ?[]  -  0 ?Simple Wound Cleansing - one wound ?X- 5 5 ?Complex Wound Cleansing - multiple wounds ?X- 1 5 ?Wound Imaging (photographs - any number of wounds) ?[]  - 0 ?Wound Tracing (instead of  photographs) ?[]  - 0 ?Simple Wound Measurement - one wound ?X- 5 5 ?Complex Wound Measurement - multiple wounds ?INTERVENTIONS - Wound Dressings ?X - Small Wound Dressing one or multiple wounds 5 10 ?[]  - 0 ?Medium Wound Dressing one or multiple wounds ?[]  - 0 ?Large Wound Dressing one or multiple wounds ?X- 1 5 ?Application of Medications - topical ?[]  - 0 ?Application of Medications - injection ?INTERVENTIONS - Miscellaneous ?[]  - External ear exam 0 ?[]  - 0 ?Specimen Collection (cultures, biopsies, blood, body fluids, etc.) ?[]  - 0 ?Specimen(s) / Culture(s) sent or taken to Lab for analysis ?[]  - 0 ?Patient Transfer (multiple staff / Civil Service fast streamer / Similar devices) ?[]  - 0 ?Simple Staple / Suture removal (25 or less) ?[]  - 0 ?Complex Staple / Suture removal (26 or more) ?[]  - 0 ?Hypo / Hyperglycemic Management (close monitor of Blood Glucose) ?[]  - 0 ?Ankle / Brachial Index (ABI) - do not check if billed separately ?X- 1 5 ?Vital Signs ?Has the patient been seen at the hospital within the last three years: Yes ?Total Score: 175 ?Level Of Care: New/Established - Level ?5 ?Electronic Signature(s) ?Signed: 09/26/2021 4:56:25 PM By: Donnamarie Poag ?Entered ByDonnamarie Poag on 09/26/2021 12:16:43 ?Patricia [name], Patricia Vanity H. ([id number]) ?-------------------------------------------------------------------------------- ?Encounter Discharge Information Details ?Patient Name: Patricia [name], Patricia [name] ?Date of Service: 09/26/2021 11:30 AM ?Medical Record Number: [id number] ?Patient Account Number: [id number] ?Date of Birth/Sex: [birth year] (71 y.o. F) ?Treating RN: Donnamarie Poag ?Primary Care Jaret Coppedge: Raelene Bott Other Clinician: ?Referring Jaretssi Kraker: Raelene Bott ?Treating Rudransh Bellanca/Extender: Ricard Dillon ?Weeks in Treatment: 5 ?Encounter Discharge Information  Items ?Discharge Condition: Stable ?Ambulatory Status: Gilford Rile ?Discharge Destination: [place] ?Telephoned: No ?Orders Sent: No ?Transportation: Other ?Accompanied By: self ?Schedule Follow-up Appointment: Yes ?Clinical Summary of Care: ?Electronic Signature(s) ?Signed: 09/26/2021 4:56:25 PM By: Donnamarie Poag ?Entered ByDonnamarie Poag on 09/26/2021 12:17:47 ?Lejla, Taflinger Nollie H. ([id number]) ?-------------------------------------------------------------------------------- ?Lower Extremity Assessment Details ?Patient Name: Patricia [name], Patricia [name] ?Date of Service: 09/26/2021 11:30 AM ?Medical Record Number: [id number] ?Patient Account Number: [id number] ?Date of Birth/Sex: November 26, 1950 (71 y.o. F) ?Treating RN: Donnamarie Poag ?Primary Care Ollie Esty: Raelene Bott Other Clinician: ?Referring Harla Mensch: Raelene Bott ?Treating Chanette Demo/Extender: Ricard Dillon ?Weeks in Treatment: 5 ?Edema Assessment ?Assessed: [Left: Yes] [Right: Yes] ?[Left: Edema] [Right: :] ?Ankle ?Left: Right: ?Point of Measurement: From Medial Instep 22 cm 22 cm ?Electronic Signature(s) ?Signed: 09/26/2021 4:56:25 PM By: Donnamarie Poag ?Entered ByDonnamarie Poag on 09/26/2021 12:02:29 ?Leany, Franchina Khya H. ([id number]) ?-------------------------------------------------------------------------------- ?Multi Wound Chart Details ?Patient Name: Patricia [name], Patricia [name] ?Date of Service: 09/26/2021 11:30 AM ?Medical Record Number: [id number] ?Patient Account Number: [id number] ?Date of Birth/Sex: 06/25/1950 (71 y.o. F) ?Treating RN: Donnamarie Poag ?Primary Care Louvina Cleary: Raelene Bott Other Clinician: ?Referring Casy Brunetto: Raelene Bott ?Treating Kelise Kuch/Extender: Ricard Dillon ?Weeks in Treatment: 5 ?Vital Signs ?Height(in): 64 ?Pulse(bpm): 116 ?Weight(lbs): 142 ?Blood Pressure(mmHg): 104/62 ?Body Mass Index(BMI): 24.4 ?Temperature(??F): 98.6 ?Respiratory Rate(breaths/min): 16 ?Photos: ?Wound Location: Left Calcaneus Right Calcaneus Right, Lateral Ankle ?Wounding Event:  Pressure Injury Pressure Injury Pressure Injury ?Primary Etiology: Pressure Ulcer Pressure Ulcer Pressure Ulcer ?Secondary Etiology: N/A Diabetic Wound/Ulcer of the Lower Diabetic Wound/Ulcer of the Lower ?Extremity

## 2021-09-27 NOTE — Progress Notes (Signed)
Patricia, Alvardo Delynn H. ([id number]) ?Visit Report for 09/26/2021 ?HPI Details ?Patient Name: Patricia [name], Patricia [name] ?Date of Service: 09/26/2021 11:30 AM ?Medical Record Number: [id number] ?Patient Account Number: [id number] ?Date of Birth/Sex: 08-29-1950 (71 y.o. F) ?Treating RN: Donnamarie Poag ?Primary Care Provider: Raelene Bott Other Clinician: ?Referring Provider: Raelene Bott ?Treating Provider/Extender: Ricard Dillon ?Weeks in Treatment: 5 ?History of Present Illness ?HPI Description: 08/18/2021 patient presents today for reevaluation here in our clinic concerning issues she has been having with her feet ?bilaterally. She has multiple pressure injuries noted at this point. She was previously seen at [facility] where she did have a work-up ?which included x-rays and identified osteomyelitis likely in the fifth metatarsal base which is consistent with where she does have a wound noted ?at this point as well. The patient does appear to have severe peripheral vascular disease on the right with an ABI of 0.36 in the office and ABI on ?the left of 0.82. Obviously this is concerning for me. Even when I saw her in [place] 1 year ago January 2022 last she had very poor arterial ?flow at that time. She tells me that she did go for revascularization I honestly cannot find that file anywhere to show where this was done. Again I ?am not saying it was not done I am just not able to find that record at this point. With that being said currently they have been using the Vaseline ?gauze with iodine. Nonetheless I do believe as well that the patient unfortunately has significant peripheral vascular disease which has led to the ?current wounds that she has at this point. ?She does have a history of multiple pressure injuries of her feet bilaterally in the heel and lateral portion of her right foot where there is noted to ?be likely osteomyelitis on x-ray. She had an elevated sed rate at 44 and a C-reactive protein of 26. Again this  is often consistent with ?inflammation which could be related to osteomyelitis as well. With that being said the patient also has a history of diabetes mellitus type 2, ?chronic kidney disease stage IIIa, coronary artery disease, chronic venous insufficiency, and congestive heart failure. ?09-12-2021 upon evaluation today patient appears to be doing well currently in regard to her wounds all things considered. Unfortunately she has ?not had pretty much anything that was recommended 3 weeks ago when she was here done with the facility. She was supposed to have a ?evaluation with vascular that has not been scheduled as far as I can tell. She was also supposed to have an MRI of the right foot also this has not ?been done as best I can tell. She was supposed to be having Betadine applied to the foot followed by dry gauze and a lightly applied roll gauze to ?secure in place. Again this has also not been done. Again I am not really certain what exactly is going on and why there is this delay. I discussed ?this with the patient today she is very frustrated as well. ?5/8; 2-week follow-up. Patient had a skilled facility in [place]. She has wounds on the right lateral foot, right lateral heel and right lateral ankle. ?Also wound on the left heel tip. We discovered a new area on the left posterior mid calf. All of these covered in significant eschar. I did not debride ?these. ?The patient did have her MRI done in [place]. This was of the right foot only. This showed multiple shallow skin ulcerations at the lateral ?midfoot posterior  lateral heel and lateral ankle without evidence of active osteomyelitis. She had a nondisplaced fracture of the fifth metatarsal ?neck ?Electronic Signature(s) ?Signed: 09/27/2021 6:42:33 AM By: Linton Ham MD ?Entered By: Linton Ham on 09/26/2021 12:15:57 ?Jennaya, [place] H. ([id number]) ?-------------------------------------------------------------------------------- ?Physical Exam  Details ?Patient Name: Patricia [name] ?Date of Service: 09/26/2021 11:30 AM ?Medical Record Number: [id number] ?Patient Account Number: [id number] ?Date of Birth/Sex: Apr 24, 1951 (71 y.o. F) ?Treating RN: Donnamarie Poag ?Primary Care Provider: Raelene Bott Other Clinician: ?Referring Provider: Raelene Bott ?Treating Provider/Extender: Ricard Dillon ?Weeks in Treatment: 5 ?Constitutional ?Sitting or standing Blood Pressure is within target range for patient.. Pulse regular and within target range for patient.Marland Kitchen Respirations regular, non- ?labored and within target range.. Temperature is normal and within the target range for the patient.Marland Kitchen appears in no distress. ?Cardiovascular ?I cannot feel popliteal pulses on either side. Pedal pulses absent bilaterally.Marland Kitchen ?Notes ?Wound exam; all of her wounds are covered with thick black ischemic looking eschar. No evidence of surrounding infection although the patient is ?tender when you press in the area I do not think she needs anything like antibiotics or any cultures done at this point. Peripheral pulses are very ?difficult to feel ?Electronic Signature(s) ?Signed: 09/27/2021 6:42:33 AM By: Linton Ham MD ?Entered By: Linton Ham on 09/26/2021 12:17:27 ?Tarnesha, Fenoglio Aubrie H. ([id number]) ?-------------------------------------------------------------------------------- ?Physician Orders Details ?Patient Name: Patricia [name] ?Date of Service: 09/26/2021 11:30 AM ?Medical Record Number: [id number] ?Patient Account Number: [id number] ?Date of Birth/Sex: [birth year] (71 y.o. F) ?Treating RN: Donnamarie Poag ?Primary Care Provider: Raelene Bott Other Clinician: ?Referring Provider: Raelene Bott ?Treating Provider/Extender: Ricard Dillon ?Weeks in Treatment: 5 ?Verbal / Phone Orders: No ?Diagnosis Coding ?Follow-up Appointments ?o Return Appointment in 3 weeks. ?o Nurse Visit as needed ?Bathing/ Shower/ Hygiene ?o May shower; gently cleanse wound with antibacterial soap,  rinse and pat dry prior to dressing wounds ?o No tub bath. ?Anesthetic (Use 'Patient Medications' Section for Anesthetic Order Entry) ?o Lidocaine applied to wound bed ?Edema Control - Lymphedema / Segmental Compressive Device / Other ?o DO YOUR BEST to sleep in the bed at night. DO NOT sleep in your recliner. Long hours of sitting in a recliner leads to ?swelling of the legs and/or potential wounds on your backside. ?Off-Loading ?o Turn and reposition every 2 hours - please keep heels off bed when laying to relieve pressure ?Wound Treatment ?Wound #1 - Calcaneus Wound Laterality: Left ?Cleanser: Normal Saline 1 x Per Day/30 Days ?Discharge Instructions: Wash your hands with soap and water. Remove old dressing, discard into plastic bag and place into trash. ?Cleanse the wound with Normal Saline prior to applying a clean dressing using gauze sponges, not tissues or cotton balls. Do not scrub ?or use excessive force. Pat dry using gauze sponges, not tissue or cotton balls. ?Cleanser: Soap and Water 1 x Per Day/30 Days ?Discharge Instructions: Gently cleanse wound with antibacterial soap, rinse and pat dry prior to dressing wounds ?Topical: Betadine 1 x Per Day/30 Days ?Discharge Instructions: Apply betadine as directed. ?Secondary Dressing: ABD Pad 5x9 (in/in) 1 x Per Day/30 Days ?Discharge Instructions: Cover with ABD pad ?Secondary Dressing: Kerlix 4.5 x 4.1 (in/yd) 1 x Per Day/30 Days ?Discharge Instructions: Apply Kerlix 4.5 x 4.1 (in/yd) as instructed ?Secured With: Medipore Tape - 39M Medipore H Soft Cloth Surgical Tape, 2x2 (in/yd) 1 x Per Day/30 Days ?Wound #2 - Calcaneus Wound Laterality: Right ?Cleanser: Normal Saline 1 x Per Day/30 Days ?Discharge Instructions: Wash your hands with soap and water.  Remove old dressing, discard into plastic bag and place into trash. ?Cleanse the wound with Normal Saline prior to applying a clean dressing using gauze sponges, not tissues or cotton balls. Do not  scrub ?or use excessive force. Pat dry using gauze sponges, not tissue or cotton balls. ?Cleanser: Soap and Water 1 x Per Day/30 Days ?Discharge Instructions: Gently cleanse wound with antibacterial soap, rinse

## 2021-10-10 ENCOUNTER — Ambulatory Visit: Payer: Medicare Other | Admitting: Physician Assistant

## 2021-10-18 ENCOUNTER — Encounter: Payer: Medicare Other | Admitting: Physician Assistant

## 2021-10-18 DIAGNOSIS — L89620 Pressure ulcer of left heel, unstageable: Secondary | ICD-10-CM | POA: Diagnosis not present

## 2021-10-18 NOTE — Progress Notes (Addendum)
Patricia, [name] ([id number]) Visit Report for 10/18/2021 Chief Complaint Document Details Patient Name: Patricia [name], Patricia [name]. Date of Service: 10/18/2021 11:30 AM Medical Record Number: [id number] Patient Account Number: [id number] Date of Birth/Sex: 1950-12-21 (71 y.o. F) Treating RN: Levora Dredge Primary Care Provider: Raelene Bott Other Clinician: Referring Provider: Raelene Bott Treating Provider/Extender: Skipper Cliche in Treatment: 8 Information Obtained from: Patient Chief Complaint Bilateral Foot Ulcers Electronic Signature(s) Signed: 10/18/2021 11:18:23 AM By: Worthy Keeler PA-C Entered By: Worthy Keeler on 10/18/2021 11:18:23 Lauritsen, Kyree HMarland Kitchen ([id number]) -------------------------------------------------------------------------------- HPI Details Patient Name: Patricia [name], [name] Date of Service: 10/18/2021 11:30 AM Medical Record Number: [id number] Patient Account Number: [id number] Date of Birth/Sex: [birth year] (71 y.o. F) Treating RN: Levora Dredge Primary Care Provider: Raelene Bott Other Clinician: Referring Provider: Raelene Bott Treating Provider/Extender: Skipper Cliche in Treatment: 8 History of Present Illness HPI Description: 08/18/2021 patient presents today for reevaluation here in our clinic concerning issues she has been having with her feet bilaterally. She has multiple pressure injuries noted at this point. She was previously seen at [facility] where she did have a work-up which included x-rays and identified osteomyelitis likely in the fifth metatarsal base which is consistent with where she does have a wound noted at this point as well. The patient does appear to have severe peripheral vascular disease on the right with an ABI of 0.36 in the office and ABI on the left of 0.82. Obviously this is concerning for me. Even when I saw her in [place] 1 year ago January 2022 last she had very poor arterial flow at that time. She tells me  that she did go for revascularization I honestly cannot find that file anywhere to show where this was done. Again I am not saying it was not done I am just not able to find that record at this point. With that being said currently they have been using the Vaseline gauze with iodine. Nonetheless I do believe as well that the patient unfortunately has significant peripheral vascular disease which has led to the current wounds that she has at this point. She does have a history of multiple pressure injuries of her feet bilaterally in the heel and lateral portion of her right foot where there is noted to be likely osteomyelitis on x-ray. She had an elevated sed rate at 44 and a C-reactive protein of 26. Again this is often consistent with inflammation which could be related to osteomyelitis as well. With that being said the patient also has a history of diabetes mellitus type 2, chronic kidney disease stage IIIa, coronary artery disease, chronic venous insufficiency, and congestive heart failure. 09-12-2021 upon evaluation today patient appears to be doing well currently in regard to her wounds all things considered. Unfortunately she has not had pretty much anything that was recommended 3 weeks ago when she was here done with the facility. She was supposed to have a evaluation with vascular that has not been scheduled as far as I can tell. She was also supposed to have an MRI of the right foot also this has not been done as best I can tell. She was supposed to be having Betadine applied to the foot followed by dry gauze and a lightly applied roll gauze to secure in place. Again this has also not been done. Again I am not really certain what exactly is going on and why there is this delay. I discussed this with the patient today she is very frustrated as  well. 5/8; 2-week follow-up. Patient had a skilled facility in [place]. She has wounds on the right lateral foot, right lateral heel and right lateral  ankle. Also wound on the left heel tip. We discovered a new area on the left posterior mid calf. All of these covered in significant eschar. I did not debride these. The patient did have her MRI done in [place]. This was of the right foot only. This showed multiple shallow skin ulcerations at the lateral midfoot posterior lateral heel and lateral ankle without evidence of active osteomyelitis. She had a nondisplaced fracture of the fifth metatarsal neck 10-18-2021 upon evaluation today patient appears to be doing a little better in regard to her wounds. Everything seems to be dry and stable which is great news. Fortunately I do not see any evidence of active infection locally or systemically at this time which is great news. No fevers, chills, nausea, vomiting, or diarrhea. With that being said there is some question here as to whether or not the patient actually had her vascular appointment I cannot see anything active in care everywhere. With that being said listening to her talk today it appears that she may have seen a vascular surgeon that may have actually talked about the fact that they did not feel anything could be done without damaging her kidneys to improve the blood flow in her legs. With that being said she does not want to have an amputation which I completely understand and agree with. Especially if she is staying dry and stable. With that being said as I explained to her today especially if we cannot improve blood flow and have no option there I am not certain if this could have anything we can do to know whether or not she will end up with an amputation which is good have to keep doing what you are doing and trying to get this healed as best we can. Electronic Signature(s) Signed: 10/18/2021 12:05:10 PM By: Worthy Keeler PA-C Entered By: Worthy Keeler on 10/18/2021 12:05:10 Isabell, Matia Lemmie Evens  ([id number]) -------------------------------------------------------------------------------- Physical Exam Details Patient Name: [name], [name] Date of Service: 10/18/2021 11:30 AM Medical Record Number: [id number] Patient Account Number: [id number] Date of Birth/Sex: 10/02/1950 (71 y.o. F) Treating RN: Levora Dredge Primary Care Provider: Raelene Bott Other Clinician: Referring Provider: Raelene Bott Treating Provider/Extender: Skipper Cliche in Treatment: 8 Constitutional Well-nourished and well-hydrated in no acute distress. Respiratory normal breathing without difficulty. Psychiatric this patient is able to make decisions and demonstrates good insight into disease process. Alert and Oriented x 3. pleasant and cooperative. Notes Upon inspection again patient's wounds appear to be doing well the leg did heal as far as her ankle and foot locations I feel like all of these are doing better with some drying up and even some overall improvement in size with all being said and done. With that being said it still is a fairly significant issue that we have going on here and to be honest it is going to take some time for these wounds to heal if we go this route. Electronic Signature(s) Signed: 10/18/2021 12:05:39 PM By: Worthy Keeler PA-C Entered By: Worthy Keeler on 10/18/2021 12:05:39 Heilman, Atha Lemmie Evens ([id number]) -------------------------------------------------------------------------------- Physician Orders Details Patient Name: Patricia [name], Mardell H. Date of Service: 10/18/2021 11:30 AM Medical Record Number: [id number] Patient Account Number: [id number] Date of Birth/Sex: [birth year] (71 y.o. F) Treating RN: Levora Dredge Primary Care Provider: Raelene Bott Other Clinician: Referring Provider: Raelene Bott  Treating Provider/Extender: Skipper Cliche in Treatment: 8 Verbal / Phone Orders: No Diagnosis Coding ICD-10 Coding Code Description L89.620 Pressure ulcer of  left heel, unstageable L89.610 Pressure ulcer of right heel, unstageable L89.510 Pressure ulcer of right ankle, unstageable L89.890 Pressure ulcer of other site, unstageable I73.89 Other specified peripheral vascular diseases E11.621 Type 2 diabetes mellitus with foot ulcer N18.31 Chronic kidney disease, stage 3a I25.10 Atherosclerotic heart disease of native coronary artery without angina pectoris I87.2 Venous insufficiency (chronic) (peripheral) I50.42 Chronic combined systolic (congestive) and diastolic (congestive) heart failure Follow-up Appointments o Return Appointment in 3 weeks. o Nurse Visit as needed Bathing/ Shower/ Hygiene o May shower; gently cleanse wound with antibacterial soap, rinse and pat dry prior to dressing wounds o No tub bath. Anesthetic (Use 'Patient Medications' Section for Anesthetic Order Entry) o Lidocaine applied to wound bed Edema Control - Lymphedema / Segmental Compressive Device / Other o DO YOUR BEST to sleep in the bed at night. DO NOT sleep in your recliner. Long hours of sitting in a recliner leads to swelling of the legs and/or potential wounds on your backside. Off-Loading o Turn and reposition every 2 hours - please keep heels off bed when laying to relieve pressure Wound Treatment Wound #1 - Calcaneus Wound Laterality: Left Cleanser: Normal Saline 1 x Per Day/30 Days Discharge Instructions: Wash your hands with soap and water. Remove old dressing, discard into plastic bag and place into trash. Cleanse the wound with Normal Saline prior to applying a clean dressing using gauze sponges, not tissues or cotton balls. Do not scrub or use excessive force. Pat dry using gauze sponges, not tissue or cotton balls. Cleanser: Soap and Water 1 x Per Day/30 Days Discharge Instructions: Gently cleanse wound with antibacterial soap, rinse and pat dry prior to dressing wounds Topical: Betadine 1 x Per Day/30 Days Discharge Instructions: Apply  betadine as directed. Secondary Dressing: ABD Pad 5x9 (in/in) 1 x Per Day/30 Days Discharge Instructions: Cover with ABD pad Secondary Dressing: Kerlix 4.5 x 4.1 (in/yd) 1 x Per Day/30 Days Discharge Instructions: Apply Kerlix 4.5 x 4.1 (in/yd) as instructed Secured With: Medipore Tape - 39M Medipore H Soft Cloth Surgical Tape, 2x2 (in/yd) 1 x Per Day/30 Days Decker, Ahilyn H. ([id number]) Wound #2 - Calcaneus Wound Laterality: Right Cleanser: Normal Saline 1 x Per Day/30 Days Discharge Instructions: Wash your hands with soap and water. Remove old dressing, discard into plastic bag and place into trash. Cleanse the wound with Normal Saline prior to applying a clean dressing using gauze sponges, not tissues or cotton balls. Do not scrub or use excessive force. Pat dry using gauze sponges, not tissue or cotton balls. Cleanser: Soap and Water 1 x Per Day/30 Days Discharge Instructions: Gently cleanse wound with antibacterial soap, rinse and pat dry prior to dressing wounds Topical: Betadine 1 x Per Day/30 Days Discharge Instructions: Apply betadine as directed. Secondary Dressing: ABD Pad 5x9 (in/in) 1 x Per Day/30 Days Discharge Instructions: Cover with ABD pad Secondary Dressing: Kerlix 4.5 x 4.1 (in/yd) 1 x Per Day/30 Days Discharge Instructions: Apply Kerlix 4.5 x 4.1 (in/yd) as instructed Secured With: Medipore Tape - 39M Medipore H Soft Cloth Surgical Tape, 2x2 (in/yd) 1 x Per Day/30 Days Wound #3 - Ankle Wound Laterality: Right, Lateral Cleanser: Normal Saline 1 x Per Day/30 Days Discharge Instructions: Wash your hands with soap and water. Remove old dressing, discard into plastic bag and place into trash. Cleanse the wound with Normal Saline prior to applying a  clean dressing using gauze sponges, not tissues or cotton balls. Do not scrub or use excessive force. Pat dry using gauze sponges, not tissue or cotton balls. Cleanser: Soap and Water 1 x Per Day/30 Days Discharge Instructions:  Gently cleanse wound with antibacterial soap, rinse and pat dry prior to dressing wounds Topical: Betadine 1 x Per Day/30 Days Discharge Instructions: Apply betadine as directed. Secondary Dressing: ABD Pad 5x9 (in/in) 1 x Per Day/30 Days Discharge Instructions: Cover with ABD pad Secondary Dressing: Kerlix 4.5 x 4.1 (in/yd) 1 x Per Day/30 Days Discharge Instructions: Apply Kerlix 4.5 x 4.1 (in/yd) as instructed Secured With: Medipore Tape - 71M Medipore H Soft Cloth Surgical Tape, 2x2 (in/yd) 1 x Per Day/30 Days Wound #4 - Foot Wound Laterality: Right, Lateral Cleanser: Normal Saline 1 x Per Day/30 Days Discharge Instructions: Wash your hands with soap and water. Remove old dressing, discard into plastic bag and place into trash. Cleanse the wound with Normal Saline prior to applying a clean dressing using gauze sponges, not tissues or cotton balls. Do not scrub or use excessive force. Pat dry using gauze sponges, not tissue or cotton balls. Cleanser: Soap and Water 1 x Per Day/30 Days Discharge Instructions: Gently cleanse wound with antibacterial soap, rinse and pat dry prior to dressing wounds Topical: Betadine 1 x Per Day/30 Days Discharge Instructions: Apply betadine as directed. Secondary Dressing: ABD Pad 5x9 (in/in) 1 x Per Day/30 Days Discharge Instructions: Cover with ABD pad Secondary Dressing: Kerlix 4.5 x 4.1 (in/yd) 1 x Per Day/30 Days Discharge Instructions: Apply Kerlix 4.5 x 4.1 (in/yd) as instructed Secured With: [place] H Soft Cloth Surgical Tape, 2x2 (in/yd) 1 x Per Day/30 Days Electronic Signature(s) Signed: 10/18/2021 12:09:48 PM By: Worthy Keeler PA-C Signed: 10/18/2021 4:32:40 PM By: Denyce Robert, Cieara HMarland Kitchen ([id number]) Entered By: Levora Dredge on 10/18/2021 11:59:36 Sumpter, Yer H. ([id number]) -------------------------------------------------------------------------------- Problem List Details Patient Name: Ocain, Sarya  H. Date of Service: 10/18/2021 11:30 AM Medical Record Number: [id number] Patient Account Number: [id number] Date of Birth/Sex: Dec 26, 1950 (71 y.o. F) Treating RN: Levora Dredge Primary Care Provider: Raelene Bott Other Clinician: Referring Provider: Raelene Bott Treating Provider/Extender: Skipper Cliche in Treatment: 8 Active Problems ICD-10 Encounter Code Description Active Date MDM Diagnosis L89.620 Pressure ulcer of left heel, unstageable 08/18/2021 No Yes L89.610 Pressure ulcer of right heel, unstageable 08/18/2021 No Yes L89.510 Pressure ulcer of right ankle, unstageable 08/18/2021 No Yes L89.890 Pressure ulcer of other site, unstageable 08/18/2021 No Yes I73.89 Other specified peripheral vascular diseases 08/18/2021 No Yes E11.621 Type 2 diabetes mellitus with foot ulcer 08/18/2021 No Yes N18.31 Chronic kidney disease, stage 3a 08/18/2021 No Yes I25.10 Atherosclerotic heart disease of native coronary artery without angina 08/18/2021 No Yes pectoris I87.2 Venous insufficiency (chronic) (peripheral) 08/18/2021 No Yes I50.42 Chronic combined systolic (congestive) and diastolic (congestive) heart 08/18/2021 No Yes failure Inactive Problems Resolved Problems Electronic Signature(s) Signed: 10/18/2021 11:18:19 AM By: Worthy Keeler PA-C Entered By: Worthy Keeler on 10/18/2021 11:18:19 Veloso, Diera H. ([id number]) Kollmann, Jendayi H. ([id number]) -------------------------------------------------------------------------------- Progress Note Details Patient Name: Mederos, Mashonda H. Date of Service: 10/18/2021 11:30 AM Medical Record Number: [id number] Patient Account Number: [id number] Date of Birth/Sex: 03-Aug-1950 (71 y.o. F) Treating RN: Levora Dredge Primary Care Provider: Raelene Bott Other Clinician: Referring Provider: Raelene Bott Treating Provider/Extender: Skipper Cliche in Treatment: 8 Subjective Chief Complaint Information obtained from Patient Bilateral  Foot Ulcers History of Present Illness (HPI) 08/18/2021 patient presents today for reevaluation  here in our clinic concerning issues she has been having with her feet bilaterally. She has multiple pressure injuries noted at this point. She was previously seen at [facility] where she did have a work-up which included x-rays and identified osteomyelitis likely in the fifth metatarsal base which is consistent with where she does have a wound noted at this point as well. The patient does appear to have severe peripheral vascular disease on the right with an ABI of 0.36 in the office and ABI on the left of 0.82. Obviously this is concerning for me. Even when I saw her in [place] 1 year ago January 2022 last she had very poor arterial flow at that time. She tells me that she did go for revascularization I honestly cannot find that file anywhere to show where this was done. Again I am not saying it was not done I am just not able to find that record at this point. With that being said currently they have been using the Vaseline gauze with iodine. Nonetheless I do believe as well that the patient unfortunately has significant peripheral vascular disease which has led to the current wounds that she has at this point. She does have a history of multiple pressure injuries of her feet bilaterally in the heel and lateral portion of her right foot where there is noted to be likely osteomyelitis on x-ray. She had an elevated sed rate at 44 and a C-reactive protein of 26. Again this is often consistent with inflammation which could be related to osteomyelitis as well. With that being said the patient also has a history of diabetes mellitus type 2, chronic kidney disease stage IIIa, coronary artery disease, chronic venous insufficiency, and congestive heart failure. 09-12-2021 upon evaluation today patient appears to be doing well currently in regard to her wounds all things considered. Unfortunately she  has not had pretty much anything that was recommended 3 weeks ago when she was here done with the facility. She was supposed to have a evaluation with vascular that has not been scheduled as far as I can tell. She was also supposed to have an MRI of the right foot also this has not been done as best I can tell. She was supposed to be having Betadine applied to the foot followed by dry gauze and a lightly applied roll gauze to secure in place. Again this has also not been done. Again I am not really certain what exactly is going on and why there is this delay. I discussed this with the patient today she is very frustrated as well. 5/8; 2-week follow-up. Patient had a skilled facility in [place]. She has wounds on the right lateral foot, right lateral heel and right lateral ankle. Also wound on the left heel tip. We discovered a new area on the left posterior mid calf. All of these covered in significant eschar. I did not debride these. The patient did have her MRI done in [place]. This was of the right foot only. This showed multiple shallow skin ulcerations at the lateral midfoot posterior lateral heel and lateral ankle without evidence of active osteomyelitis. She had a nondisplaced fracture of the fifth metatarsal neck 10-18-2021 upon evaluation today patient appears to be doing a little better in regard to her wounds. Everything seems to be dry and stable which is great news. Fortunately I do not see any evidence of active infection locally or systemically at this time which is great news. No fevers, chills, nausea,  vomiting, or diarrhea. With that being said there is some question here as to whether or not the patient actually had her vascular appointment I cannot see anything active in care everywhere. With that being said listening to her talk today it appears that she may have seen a vascular surgeon that may have actually talked about the fact that they did not feel anything could be  done without damaging her kidneys to improve the blood flow in her legs. With that being said she does not want to have an amputation which I completely understand and agree with. Especially if she is staying dry and stable. With that being said as I explained to her today especially if we cannot improve blood flow and have no option there I am not certain if this could have anything we can do to know whether or not she will end up with an amputation which is good have to keep doing what you are doing and trying to get this healed as best we can. Objective Constitutional Well-nourished and well-hydrated in no acute distress. Vitals Time Taken: 11:13 AM, Height: 64 in, Weight: 142 lbs, BMI: 24.4, Temperature: 98.1 F, Pulse: 86 bpm, Respiratory Rate: 18 breaths/min, Blood Pressure: 114/74 mmHg. Respiratory Shanker, Beckham ([id number]) normal breathing without difficulty. Psychiatric this patient is able to make decisions and demonstrates good insight into disease process. Alert and Oriented x 3. pleasant and cooperative. General Notes: Upon inspection again patient's wounds appear to be doing well the leg did heal as far as her ankle and foot locations I feel like all of these are doing better with some drying up and even some overall improvement in size with all being said and done. With that being said it still is a fairly significant issue that we have going on here and to be honest it is going to take some time for these wounds to heal if we go this route. Integumentary (Hair, Skin) Wound #1 status is Open. Original cause of wound was Pressure Injury. The date acquired was: 05/22/2021. The wound has been in treatment 8 weeks. The wound is located on the Left Calcaneus. The wound measures 2.3cm length x 3cm width x 0.1cm depth; 5.419cm^2 area and 0.542cm^3 volume. There is no tunneling or undermining noted. There is a none present amount of drainage noted. There is no granulation within the  wound bed. There is a large (67-100%) amount of necrotic tissue within the wound bed including Eschar. Wound #2 status is Open. Original cause of wound was Pressure Injury. The date acquired was: 05/22/2021. The wound has been in treatment 8 weeks. The wound is located on the Right Calcaneus. The wound measures 2cm length x 2.7cm width x 0.1cm depth; 4.241cm^2 area and 0.424cm^3 volume. There is Fat Layer (Subcutaneous Tissue) exposed. There is no tunneling or undermining noted. There is a none present amount of drainage noted. There is no granulation within the wound bed. There is a large (67-100%) amount of necrotic tissue within the wound bed including Eschar. Wound #3 status is Open. Original cause of wound was Pressure Injury. The date acquired was: 07/21/2018. The wound has been in treatment 8 weeks. The wound is located on the Right,Lateral Ankle. The wound measures 0.5cm length x 0.5cm width x 0.1cm depth; 0.196cm^2 area and 0.02cm^3 volume. There is Fat Layer (Subcutaneous Tissue) exposed. There is no tunneling or undermining noted. There is a none present amount of drainage noted. There is no granulation within the wound bed. There is  a large (67-100%) amount of necrotic tissue within the wound bed including Eschar. Wound #4 status is Open. Original cause of wound was Pressure Injury. The date acquired was: 07/21/2018. The wound has been in treatment 8 weeks. The wound is located on the Right,Lateral Foot. The wound measures 2.7cm length x 3cm width x 0.1cm depth; 6.362cm^2 area and 0.636cm^3 volume. There is Fat Layer (Subcutaneous Tissue) exposed. There is no tunneling or undermining noted. There is a none present amount of drainage noted. There is no granulation within the wound bed. There is a large (67-100%) amount of necrotic tissue within the wound bed including Eschar. Wound #5 status is Healed - Epithelialized. Original cause of wound was Gradually Appeared. The date acquired was:  09/19/2021. The wound has been in treatment 3 weeks. The wound is located on the Left,Posterior Lower Leg. The wound measures 0cm length x 0cm width x 0cm depth; 0cm^2 area and 0cm^3 volume. There is no tunneling or undermining noted. There is a none present amount of drainage noted. There is no granulation within the wound bed. There is no necrotic tissue within the wound bed. Assessment Active Problems ICD-10 Pressure ulcer of left heel, unstageable Pressure ulcer of right heel, unstageable Pressure ulcer of right ankle, unstageable Pressure ulcer of other site, unstageable Other specified peripheral vascular diseases Type 2 diabetes mellitus with foot ulcer Chronic kidney disease, stage 3a Atherosclerotic heart disease of native coronary artery without angina pectoris Venous insufficiency (chronic) (peripheral) Chronic combined systolic (congestive) and diastolic (congestive) heart failure Plan Follow-up Appointments: Return Appointment in 3 weeks. Nurse Visit as needed Bathing/ Shower/ Hygiene: May shower; gently cleanse wound with antibacterial soap, rinse and pat dry prior to dressing wounds No tub bath. Anesthetic (Use 'Patient Medications' Section for Anesthetic Order Entry): Lidocaine applied to wound bed Edema Control - Lymphedema / Segmental Compressive Device / Other: DO YOUR BEST to sleep in the bed at night. DO NOT sleep in your recliner. Long hours of sitting in a recliner leads to swelling of the legs and/or potential wounds on your backside. Off-Loading: COLBIE, VESCOVI. ([id number]) Turn and reposition every 2 hours - please keep heels off bed when laying to relieve pressure WOUND #1: - Calcaneus Wound Laterality: Left Cleanser: Normal Saline 1 x Per Day/30 Days Discharge Instructions: Wash your hands with soap and water. Remove old dressing, discard into plastic bag and place into trash. Cleanse the wound with Normal Saline prior to applying a clean dressing  using gauze sponges, not tissues or cotton balls. Do not scrub or use excessive force. Pat dry using gauze sponges, not tissue or cotton balls. Cleanser: Soap and Water 1 x Per Day/30 Days Discharge Instructions: Gently cleanse wound with antibacterial soap, rinse and pat dry prior to dressing wounds Topical: Betadine 1 x Per Day/30 Days Discharge Instructions: Apply betadine as directed. Secondary Dressing: ABD Pad 5x9 (in/in) 1 x Per Day/30 Days Discharge Instructions: Cover with ABD pad Secondary Dressing: Kerlix 4.5 x 4.1 (in/yd) 1 x Per Day/30 Days Discharge Instructions: Apply Kerlix 4.5 x 4.1 (in/yd) as instructed Secured With: Medipore Tape - 9M Medipore H Soft Cloth Surgical Tape, 2x2 (in/yd) 1 x Per Day/30 Days WOUND #2: - Calcaneus Wound Laterality: Right Cleanser: Normal Saline 1 x Per Day/30 Days Discharge Instructions: Wash your hands with soap and water. Remove old dressing, discard into plastic bag and place into trash. Cleanse the wound with Normal Saline prior to applying a clean dressing using gauze sponges, not tissues or cotton  balls. Do not scrub or use excessive force. Pat dry using gauze sponges, not tissue or cotton balls. Cleanser: Soap and Water 1 x Per Day/30 Days Discharge Instructions: Gently cleanse wound with antibacterial soap, rinse and pat dry prior to dressing wounds Topical: Betadine 1 x Per Day/30 Days Discharge Instructions: Apply betadine as directed. Secondary Dressing: ABD Pad 5x9 (in/in) 1 x Per Day/30 Days Discharge Instructions: Cover with ABD pad Secondary Dressing: Kerlix 4.5 x 4.1 (in/yd) 1 x Per Day/30 Days Discharge Instructions: Apply Kerlix 4.5 x 4.1 (in/yd) as instructed Secured With: Medipore Tape - 34M Medipore H Soft Cloth Surgical Tape, 2x2 (in/yd) 1 x Per Day/30 Days WOUND #3: - Ankle Wound Laterality: Right, Lateral Cleanser: Normal Saline 1 x Per Day/30 Days Discharge Instructions: Wash your hands with soap and water. Remove old  dressing, discard into plastic bag and place into trash. Cleanse the wound with Normal Saline prior to applying a clean dressing using gauze sponges, not tissues or cotton balls. Do not scrub or use excessive force. Pat dry using gauze sponges, not tissue or cotton balls. Cleanser: Soap and Water 1 x Per Day/30 Days Discharge Instructions: Gently cleanse wound with antibacterial soap, rinse and pat dry prior to dressing wounds Topical: Betadine 1 x Per Day/30 Days Discharge Instructions: Apply betadine as directed. Secondary Dressing: ABD Pad 5x9 (in/in) 1 x Per Day/30 Days Discharge Instructions: Cover with ABD pad Secondary Dressing: Kerlix 4.5 x 4.1 (in/yd) 1 x Per Day/30 Days Discharge Instructions: Apply Kerlix 4.5 x 4.1 (in/yd) as instructed Secured With: Medipore Tape - 34M Medipore H Soft Cloth Surgical Tape, 2x2 (in/yd) 1 x Per Day/30 Days WOUND #4: - Foot Wound Laterality: Right, Lateral Cleanser: Normal Saline 1 x Per Day/30 Days Discharge Instructions: Wash your hands with soap and water. Remove old dressing, discard into plastic bag and place into trash. Cleanse the wound with Normal Saline prior to applying a clean dressing using gauze sponges, not tissues or cotton balls. Do not scrub or use excessive force. Pat dry using gauze sponges, not tissue or cotton balls. Cleanser: Soap and Water 1 x Per Day/30 Days Discharge Instructions: Gently cleanse wound with antibacterial soap, rinse and pat dry prior to dressing wounds Topical: Betadine 1 x Per Day/30 Days Discharge Instructions: Apply betadine as directed. Secondary Dressing: ABD Pad 5x9 (in/in) 1 x Per Day/30 Days Discharge Instructions: Cover with ABD pad Secondary Dressing: Kerlix 4.5 x 4.1 (in/yd) 1 x Per Day/30 Days Discharge Instructions: Apply Kerlix 4.5 x 4.1 (in/yd) as instructed Secured With: Medipore Tape - 34M Medipore H Soft Cloth Surgical Tape, 2x2 (in/yd) 1 x Per Day/30 Days 1. Again I am unsure as to whether  or not the patient saw the vascular surgeon my suspicion is that she may have. I cannot be 100% for sure. With that being said we will get a try to reach out to the facility and see if they have a note that they could share with [name]. 2. I am also can recommend that we continue with the Betadine paints to the wounds daily and then subsequently the dry gauze and ABD pad to secure. 3. I am also going to suggest the patient should try to keep pressure off of her foot/ankle, and heel locations to prevent this from worsening. We will see patient back for reevaluation in 3 weeks here in the clinic. If anything worsens or changes patient will contact our office for additional recommendations. Electronic Signature(s) Signed: 10/18/2021 12:06:29 PM By: Joaquim Lai  III, Bijan Ridgley PA-C Entered By: Worthy Keeler on 10/18/2021 12:06:29 MANILLA, LUERAS ([id number]) -------------------------------------------------------------------------------- SuperBill Details Patient Name: TOWNSHEND, Kiasia H. Date of Service: 10/18/2021 Medical Record Number: [id number] Patient Account Number: [id number] Date of Birth/Sex: September 05, 1950 (71 y.o. 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 diastolic (congestive) heart  failure Facility Procedures CPT4 Code: [id number] Description: 99214 - WOUND CARE VISIT-LEV 4 EST PT Modifier: Quantity: 1 Physician Procedures CPT4 [id number] Description: 99214 - WC PHYS LEVEL 4 - EST PT Modifier: Quantity: 1 CPT4 Code: Description: ICD-10 Diagnosis Description L89.620 Pressure ulcer of left heel, unstageable L89.610 Pressure ulcer of right heel, unstageable L89.510 Pressure ulcer of right ankle, unstageable L89.890 Pressure ulcer of other site, unstageable Modifier: Quantity: Electronic Signature(s) Signed: 10/18/2021 12:06:54 PM By: Worthy Keeler PA-C Entered By: Worthy Keeler on 10/18/2021 12:06:54

## 2021-10-18 NOTE — Progress Notes (Signed)
ORETA, SOLOWAY ([id number]) Visit Report for 10/18/2021 Arrival Information Details Patient Name: [name] [name] [name] [name]. Date of Service: 10/18/2021 11:30 AM Medical Record Number: [id number] Patient Account Number: [id number] Date of Birth/Sex: 1951-04-22 (71 y.o. F) Treating RN: Levora Dredge Primary Care [name] [name] Other Clinician: Referring [name] [name] Treating Temitope Flammer/Extender: Skipper Cliche in Treatment: 8 Visit Information History Since Last Visit Added or deleted any medications: No Patient Arrived: Wheel Chair Any new allergies or adverse reactions: No Arrival Time: 11:12 Had a fall or experienced change in No Accompanied By: staff activities of daily living that may affect Transfer Assistance: EasyPivot Patient Lift risk of falls: Patient Identification Verified: Yes Hospitalized since last visit: No Secondary Verification Process Completed: Yes Has Dressing in Place as Prescribed: No Patient Requires Transmission-Based No Pain Present Now: No Precautions: Patient Has Alerts: Yes Patient Alerts: Patient on Blood Thinner type 2 diabetic Lander Signature(s) Signed: 10/18/2021 4:32:40 PM By: Levora Dredge Entered By: Levora Dredge on 10/18/2021 11:13:07 [name] [name] [name] [name] ([id number]) -------------------------------------------------------------------------------- Clinic Level of Care Assessment Details Patient Name: [name] [name] H. Date of Service: 10/18/2021 11:30 AM Medical Record Number: [id number] Patient Account Number: [id number] Date of Birth/Sex: 04/23/1951 (71 y.o. F) Treating RN: Levora Dredge Primary Care Jahnia Hewes: Raelene [name] Other Clinician: Referring Maye Parkinson: Raelene [name] Treating Eriyonna Matsushita/Extender: Skipper Cliche in Treatment: 8 Clinic Level of Care Assessment Items TOOL 4 Quantity Score []  - Use when only an EandM is performed on FOLLOW-UP visit 0 ASSESSMENTS - Nursing  Assessment / Reassessment X - Reassessment of Co-morbidities (includes updates in patient status) 1 10 []  - 0 Reassessment of Adherence to Treatment Plan ASSESSMENTS - Wound and Skin Assessment / Reassessment []  - Simple Wound Assessment / Reassessment - one wound 0 X- 4 5 Complex Wound Assessment / Reassessment - multiple wounds []  - 0 Dermatologic / Skin Assessment (not related to wound area) ASSESSMENTS - Focused Assessment X - Circumferential Edema Measurements - multi extremities 1 5 []  - 0 Nutritional Assessment / Counseling / Intervention []  - 0 Lower Extremity Assessment (monofilament, tuning fork, pulses) []  - 0 Peripheral Arterial Disease Assessment (using hand held doppler) ASSESSMENTS - Ostomy and/or Continence Assessment and Care []  - Incontinence Assessment and Management 0 []  - 0 Ostomy Care Assessment and Management (repouching, etc.) PROCESS - Coordination of Care X - Simple Patient / Family Education for ongoing care 1 15 []  - 0 Complex (extensive) Patient / Family Education for ongoing care []  - 0 Staff obtains Programmer, systems, Records, Test Results / Process Orders []  - 0 Staff telephones HHA, Nursing Homes / Clarify orders / etc []  - 0 Routine Transfer to another Facility (non-emergent condition) []  - 0 Routine Hospital Admission (non-emergent condition) []  - 0 New Admissions / Biomedical engineer / Ordering NPWT, Apligraf, etc. []  - 0 Emergency Hospital Admission (emergent condition) X- 1 10 Simple Discharge Coordination []  - 0 Complex (extensive) Discharge Coordination PROCESS - Special Needs []  - Pediatric / Minor Patient Management 0 []  - 0 Isolation Patient Management []  - 0 Hearing / Language / Visual special needs []  - 0 Assessment of Community assistance (transportation, D/C planning, etc.) []  - 0 Additional assistance / Altered mentation []  - 0 Support Surface(s) Assessment (bed, cushion, seat, etc.) INTERVENTIONS - Wound Cleansing /  Measurement Holderman, [name] H. ([id number]) []  - 0 Simple Wound Cleansing - one wound X- 4 5 Complex Wound Cleansing - multiple wounds X- 1 5 Wound Imaging (photographs - any number of wounds) []  -  0 Wound Tracing (instead of photographs) $RemoveBeforeD'[]'xHJYmKnyUndkeQ$  - 0 Simple Wound Measurement - one wound X- 4 5 Complex Wound Measurement - multiple wounds INTERVENTIONS - Wound Dressings X - Small Wound Dressing one or multiple wounds 4 10 $Re'[]'HSH$  - 0 Medium Wound Dressing one or multiple wounds $RemoveBeforeD'[]'HvEmKbnCvqcKYS$  - 0 Large Wound Dressing one or multiple wounds $RemoveBeforeD'[]'wHSbUUgwCjMUGc$  - 0 Application of Medications - topical $RemoveB'[]'kQgxayNF$  - 0 Application of Medications - injection INTERVENTIONS - Miscellaneous $RemoveBeforeD'[]'uxWbkpENwZyyDM$  - External ear exam 0 $Remo'[]'wEIUm$  - 0 Specimen Collection (cultures, biopsies, blood, body fluids, etc.) $RemoveBefor'[]'FYOPvjqePqzC$  - 0 Specimen(s) / Culture(s) sent or taken to Lab for analysis $RemoveBefo'[]'rRNwLjaybYj$  - 0 Patient Transfer (multiple staff / Civil Service fast streamer / Similar devices) $RemoveBeforeDE'[]'OlRCQTFNyRrIvWp$  - 0 Simple Staple / Suture removal (25 or less) $Remove'[]'xeaZwjT$  - 0 Complex Staple / Suture removal (26 or more) $Remove'[]'VsDTMGY$  - 0 Hypo / Hyperglycemic Management (close monitor of Blood Glucose) $RemoveBefore'[]'DKRlKrZPrQgJS$  - 0 Ankle / Brachial Index (ABI) - do not check if billed separately X- 1 5 Vital Signs Has the patient been seen at the hospital within the last three years: Yes Total Score: 150 Level Of Care: New/Established - Level 4 Electronic Signature(s) Signed: 10/18/2021 4:32:40 PM By: Levora Dredge Entered By: Levora Dredge on 10/18/2021 12:04:48 [name] [name] [name] [name] ([id number]) -------------------------------------------------------------------------------- Encounter Discharge Information Details Patient Name: Patricia [name] [name] H. Date of Service: 10/18/2021 11:30 AM Medical Record Number: [id number] Patient Account Number: [id number] Date of Birth/Sex: [birth year] (70 y.o. F) Treating RN: Levora Dredge Primary Care Eura Radabaugh: Raelene [name] Other Clinician: Referring Elliot Meldrum: Raelene [name] Treating Sacramento Monds/Extender: Skipper Cliche in Treatment: 8 Encounter Discharge Information Items Discharge Condition: Stable Ambulatory Status: Wheelchair Discharge Destination: [place] Telephoned: No Orders Sent: Yes Transportation: Other Accompanied By: staff Schedule Follow-up Appointment: Yes Clinical Summary of Care: Electronic Signature(s) Signed: 10/18/2021 4:32:40 PM By: Levora Dredge Entered By: Levora Dredge on 10/18/2021 12:05:41 [name] [name] [name] [name] ([id number]) -------------------------------------------------------------------------------- Lower Extremity Assessment Details Patient Name: Urbach, Annette H. Date of Service: 10/18/2021 11:30 AM Medical Record Number: [id number] Patient Account Number: [id number] Date of Birth/Sex: [birth year] (71 y.o. F) Treating RN: Levora Dredge Primary Care Munirah Doerner: Raelene [name] Other Clinician: Referring Napoleon Monacelli: Raelene [name] Treating Shan Valdes/Extender: Skipper Cliche in Treatment: 8 Edema Assessment Assessed: [Left: No] [Right: No] Edema: [Left: Yes] [Right: Yes] Calf Left: Right: Point of Measurement: 30 cm From Medial Instep 35.5 cm 35 cm Ankle Left: Right: Point of Measurement: 9 cm From Medial Instep 21.5 cm 22.2 cm Vascular Assessment Pulses: Dorsalis Pedis Doppler Audible: [Left:Yes] [Right:Yes] Electronic Signature(s) Signed: 10/18/2021 4:32:40 PM By: Levora Dredge Entered By: Levora Dredge on 10/18/2021 11:32:02 [name] [name] H. ([id number]) -------------------------------------------------------------------------------- Multi Wound Chart Details Patient Name: Bokhari, Myrna H. Date of Service: 10/18/2021 11:30 AM Medical Record Number: [id number] Patient Account Number: [id number] Date of Birth/Sex: 1951-03-16 (71 y.o. F) Treating RN: Levora Dredge Primary Care Serafina Topham: Raelene [name] Other Clinician: Referring Oluchi Pucci: Raelene [name] Treating Claudell Rhody/Extender: Skipper Cliche in Treatment: 8 Vital  Signs Height(in): 38 Pulse(bpm): 11 Weight(lbs): 142 Blood Pressure(mmHg): 114/74 Body Mass Index(BMI): 24.4 Temperature(F): 98.1 Respiratory Rate(breaths/min): 18 Photos: Wound Location: Left Calcaneus Right Calcaneus Right, Lateral Ankle Wounding Event: Pressure Injury Pressure Injury Pressure Injury Primary Etiology: Pressure Ulcer Pressure Ulcer Pressure Ulcer Secondary Etiology: N/A Diabetic Wound/Ulcer of the Lower Diabetic Wound/Ulcer of the Lower Extremity Extremity Comorbid History: Chronic sinus problems/congestion, Chronic sinus problems/congestion, Chronic sinus problems/congestion, Anemia, Chronic Obstructive Anemia, Chronic Obstructive Anemia, Chronic Obstructive Pulmonary Disease (COPD), Pulmonary Disease (COPD), Pulmonary Disease (COPD), Congestive Heart Failure,  Congestive Heart Failure, Congestive Heart Failure, Hypertension, Peripheral Venous Hypertension, Peripheral Venous Hypertension, Peripheral Venous Disease, Type II Diabetes, Disease, Type II Diabetes, Disease, Type II Diabetes, Rheumatoid Arthritis, Neuropathy Rheumatoid Arthritis, Neuropathy Rheumatoid Arthritis, Neuropathy Date Acquired: 05/22/2021 05/22/2021 07/21/2018 Weeks of Treatment: 8 8 8  Wound Status: Open Open Open Wound Recurrence: No No No Measurements L x [name] x D (cm) 2.3x3x0.1 2x2.7x0.1 0.5x0.5x0.1 Area (cm) : 5.419 4.241 0.196 Volume (cm) : 0.542 0.424 0.02 % Reduction in Area: -15.00% -50.00% 69.20% % Reduction in Volume: -15.10% -49.80% 68.80% Classification: Unstageable/Unclassified Unstageable/Unclassified Unstageable/Unclassified Exudate Amount: None Present None Present None Present Granulation Amount: None Present (0%) None Present (0%) None Present (0%) Necrotic Amount: Large (67-100%) Large (67-100%) Large (67-100%) Necrotic Tissue: Eschar Eschar Eschar Exposed Structures: Fat Layer (Subcutaneous Tissue): Fat Layer (Subcutaneous Tissue): Fat Layer (Subcutaneous Tissue): No Yes  Yes Fascia: No Fascia: No Tendon: No Tendon: No Muscle: No Muscle: No Joint: No Joint: No Bone: No Bone: No Epithelialization: None None None Wound Number: 4 5 N/A Photos: N/A KALEI, MEDA ([name]) Wound Location: Right, Lateral Foot Left, Posterior Lower Leg N/A Wounding Event: Pressure Injury Gradually Appeared N/A Primary Etiology: Pressure Ulcer Diabetic Wound/Ulcer of the Lower N/A Extremity Secondary Etiology: 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 N/A Extremity Comorbid History: Chronic sinus problems/congestion, Chronic sinus problems/congestion, N/A Anemia, Chronic Obstructive Anemia, Chronic Obstructive Pulmonary Disease (COPD), Pulmonary Disease (COPD), Congestive Heart Failure, Congestive Heart Failure, Hypertension, Peripheral Venous Hypertension, Peripheral Venous Disease, Type II Diabetes, Disease, Type II Diabetes, Rheumatoid Arthritis, Neuropathy Rheumatoid Arthritis, Neuropathy Date Acquired: 07/21/2018 09/19/2021 N/A Weeks of Treatment: 8 3 N/A Wound Status: Open Open N/A Wound Recurrence: No No N/A Measurements L x [name] x D (cm) 2.7x3x0.1 0.1x0.1x0.1 N/A Area (cm) : 6.362 0.008 N/A Volume (cm) : 0.636 0.001 N/A % Reduction in Area: 6.20% 99.00% N/A % Reduction in Volume: 6.30% 98.70% N/A Classification: Unstageable/Unclassified Grade 1 N/A Exudate Amount: None Present None Present N/A Granulation Amount: None Present (0%) None Present (0%) N/A Necrotic Amount: Large (67-100%) None Present (0%) N/A Necrotic Tissue: Eschar N/A N/A Exposed Structures: Fat Layer (Subcutaneous Tissue): N/A N/A Yes Fascia: No Tendon: No Muscle: No Joint: No Bone: No Epithelialization: None Large (67-100%) N/A Treatment Notes Electronic Signature(s) Signed: 10/18/2021 4:32:40 PM By: 10/20/2021 Previous Signature: 10/18/2021 11:34:35 AM Version By: 10/20/2021 Entered By: Angelina Pih on 10/18/2021 11:46:51 [name] [name] 10/20/2021  (Rexene Edison) -------------------------------------------------------------------------------- Multi-Disciplinary Care Plan Details Patient Name: [name] [name] H. Date of Service: 10/18/2021 11:30 AM Medical Record Number: 10/20/2021 Patient Account Number: [id number] Date of Birth/Sex: 1951-01-29 (71 y.o. F) Treating RN: 62 Primary Care Timathy Newberry: Angelina Pih Other Clinician: Referring Caeson Filippi: Lindwood Qua Treating Stana Bayon/Extender: Lindwood Qua in Treatment: 8 Active Inactive Pressure Nursing Diagnoses: Knowledge deficit related to causes and risk factors for pressure ulcer development Knowledge deficit related to management of pressures ulcers Potential for impaired tissue integrity related to pressure, friction, moisture, and shear Goals: Patient will remain free from development of additional pressure ulcers Date Initiated: 08/18/2021 Date Inactivated: 10/18/2021 Target Resolution Date: 08/20/2021 Goal Status: Met Patient will remain free of pressure ulcers Date Initiated: 08/18/2021 Target Resolution Date: 08/20/2021 Goal Status: Active Patient/caregiver will verbalize risk factors for pressure ulcer development Date Initiated: 08/18/2021 Target Resolution Date: 08/18/2021 Goal Status: Active Patient/caregiver will verbalize understanding of pressure ulcer management Date Initiated: 08/18/2021 Date Inactivated: 09/12/2021 Target Resolution Date: 08/18/2021 Goal Status: Met Interventions: Assess: immobility, friction, shearing, incontinence upon admission and as needed Assess  offloading mechanisms upon admission and as needed Assess potential for pressure ulcer upon admission and as needed Provide education on pressure ulcers Notes: Wound/Skin Impairment Nursing Diagnoses: Impaired tissue integrity Knowledge deficit related to ulceration/compromised skin integrity Goals: Ulcer/skin breakdown will have a volume reduction of 30% by week 4 Date Initiated:  08/18/2021 Target Resolution Date: 09/15/2021 Goal Status: Active Ulcer/skin breakdown will have a volume reduction of 50% by week 8 Date Initiated: 08/18/2021 Target Resolution Date: 10/13/2021 Goal Status: Active Ulcer/skin breakdown will have a volume reduction of 80% by week 12 Date Initiated: 08/18/2021 Target Resolution Date: 11/10/2021 Goal Status: Active Ulcer/skin breakdown will heal within 14 weeks Date Initiated: 08/18/2021 Target Resolution Date: 11/24/2021 Goal Status: Active Interventions: Assess patient/caregiver ability to obtain necessary supplies Assess patient/caregiver ability to perform ulcer/skin care regimen upon admission and as needed ZEA, KOSTKA ([id number]) Assess ulceration(s) every visit Provide education on ulcer and skin care Notes: Electronic Signature(s) Signed: 10/18/2021 4:32:40 PM By: Levora Dredge Previous Signature: 10/18/2021 11:34:25 AM Version By: Levora Dredge Entered By: Levora Dredge on 10/18/2021 11:46:36 [place], [place]. ([id number]) -------------------------------------------------------------------------------- Pain Assessment Details Patient Name: Wares, [name] H. Date of Service: 10/18/2021 11:30 AM Medical Record Number: [id number] Patient Account Number: [id number] Date of Birth/Sex: July 11, 1950 (71 y.o. F) Treating RN: Levora Dredge Primary Care Mell Mellott: Raelene [name] Other Clinician: Referring Ylonda Storr: Raelene [name] Treating Natilee Gauer/Extender: Skipper Cliche in Treatment: 8 Active Problems Location of Pain Severity and Description of Pain Patient Has Paino No Site Locations Rate the pain. Current Pain Level: 0 Pain Management and Medication Current Pain Management: Electronic Signature(s) Signed: 10/18/2021 4:32:40 PM By: Levora Dredge Entered By: Levora Dredge on 10/18/2021 11:15:03 Openshaw, Jevaeh [name] [name]  ([id number]) -------------------------------------------------------------------------------- Patient/Caregiver Education Details Patient Name: Patricia [name], Dan H. Date of Service: 10/18/2021 11:30 AM Medical Record Number: [id number] Patient Account Number: [id number] Date of Birth/Gender: September 19, 1950 (71 y.o. F) Treating RN: Levora Dredge Primary Care Physician: Raelene [name] Other Clinician: Referring Physician: Raelene [name] Treating Physician/Extender: Skipper Cliche in Treatment: 8 Education Assessment Education Provided To: Patient and Caregiver Education Topics Provided Wound/Skin Impairment: Handouts: Caring for Your Ulcer Methods: Explain/Verbal Responses: State content correctly Electronic Signature(s) Signed: 10/18/2021 4:32:40 PM By: Levora Dredge Entered By: Levora Dredge on 10/18/2021 12:05:02 Vertz, Kindra [name] [name] ([id number]) -------------------------------------------------------------------------------- Wound Assessment Details Patient Name: Catoe, Mariaisabel H. Date of Service: 10/18/2021 11:30 AM Medical Record Number: [id number] Patient Account Number: [id number] Date of Birth/Sex: 1950/07/30 (71 y.o. F) Treating RN: Levora Dredge Primary Care Becker Christopher: Raelene [name] Other Clinician: Referring Sohrab Keelan: Raelene [name] Treating Yuette Putnam/Extender: Skipper Cliche in Treatment: 8 Wound Status Wound Number: 1 Primary Pressure Ulcer Etiology: Wound Location: Left Calcaneus Wound Open Wounding Event: Pressure Injury Status: Date Acquired: 05/22/2021 Comorbid Chronic sinus problems/congestion, Anemia, Chronic Weeks Of Treatment: 8 History: Obstructive Pulmonary Disease (COPD), Congestive Heart Clustered Wound: No Failure, Hypertension, Peripheral Venous Disease, Type II Diabetes, Rheumatoid Arthritis, Neuropathy Photos Wound Measurements Length: (cm) 2.3 Width: (cm) 3 Depth: (cm) 0.1 Area: (cm) 5.419 Volume: (cm) 0.542 % Reduction in Area: -15% %  Reduction in Volume: -15.1% Epithelialization: None Tunneling: No Undermining: No Wound Description Classification: Unstageable/Unclassified Exudate Amount: None Present Foul Odor After Cleansing: No Slough/Fibrino Yes Wound Bed Granulation Amount: None Present (0%) Exposed Structure Necrotic Amount: Large (67-100%) Fat Layer (Subcutaneous Tissue) Exposed: No Necrotic Quality: Eschar Treatment Notes Wound #1 (Calcaneus) Wound Laterality: Left Cleanser Normal Saline Discharge Instruction: Wash your hands with soap and water. Remove old dressing, discard into plastic bag and place into trash. Cleanse  the wound with Normal Saline prior to applying a clean dressing using gauze sponges, not tissues or cotton balls. Do not scrub or use excessive force. Pat dry using gauze sponges, not tissue or cotton balls. Soap and Water Discharge Instruction: Gently cleanse wound with antibacterial soap, rinse and pat dry prior to dressing wounds Peri-Wound Care KUHLMANN, Agustina H. ([id number]) Topical Betadine Discharge Instruction: Apply betadine as directed. Primary Dressing Secondary Dressing ABD Pad 5x9 (in/in) Discharge Instruction: Cover with ABD pad Kerlix 4.5 x 4.1 (in/yd) Discharge Instruction: Apply Kerlix 4.5 x 4.1 (in/yd) as instructed Secured With [place] H Soft Cloth Surgical Tape, 2x2 (in/yd) Compression Wrap Compression Stockings Add-Ons Electronic Signature(s) Signed: 10/18/2021 4:32:40 PM By: Levora Dredge Entered By: Levora Dredge on 10/18/2021 11:28:13 Pettry, Serenity H. ([id number]) -------------------------------------------------------------------------------- Wound Assessment Details Patient Name: Dupas, Tinsleigh H. Date of Service: 10/18/2021 11:30 AM Medical Record Number: [id number] Patient Account Number: [id number] Date of Birth/Sex: 15-Jun-1950 (71 y.o. F) Treating RN: Levora Dredge Primary Care Nicola Quesnell: Raelene [name] Other  Clinician: Referring Emori Mumme: Raelene [name] Treating Annaya Bangert/Extender: Skipper Cliche in Treatment: 8 Wound Status Wound Number: 2 Primary Pressure Ulcer Etiology: Wound Location: Right Calcaneus Secondary Diabetic Wound/Ulcer of the Lower Extremity Wounding Event: Pressure Injury Etiology: Date Acquired: 05/22/2021 Wound Open Weeks Of Treatment: 8 Status: Clustered Wound: No Comorbid Chronic sinus problems/congestion, Anemia, Chronic History: Obstructive Pulmonary Disease (COPD), Congestive Heart Failure, Hypertension, Peripheral Venous Disease, Type II Diabetes, Rheumatoid Arthritis, Neuropathy Photos Wound Measurements Length: (cm) 2 Width: (cm) 2.7 Depth: (cm) 0.1 Area: (cm) 4.241 Volume: (cm) 0.424 % Reduction in Area: -50% % Reduction in Volume: -49.8% Epithelialization: None Tunneling: No Undermining: No Wound Description Classification: Unstageable/Unclassified Exudate Amount: None Present Foul Odor After Cleansing: No Slough/Fibrino Yes Wound Bed Granulation Amount: None Present (0%) Exposed Structure Necrotic Amount: Large (67-100%) Fascia Exposed: No Necrotic Quality: Eschar Fat Layer (Subcutaneous Tissue) Exposed: Yes Tendon Exposed: No Muscle Exposed: No Joint Exposed: No Bone Exposed: No Treatment Notes Wound #2 (Calcaneus) Wound Laterality: Right Cleanser Normal Saline Discharge Instruction: Wash your hands with soap and water. Remove old dressing, discard into plastic bag and place into trash. Cleanse the wound with Normal Saline prior to applying a clean dressing using gauze sponges, not tissues or cotton balls. Do not Menning, Sudie H. ([id number]) scrub or use excessive force. Pat dry using gauze sponges, not tissue or cotton balls. Soap and Water Discharge Instruction: Gently cleanse wound with antibacterial soap, rinse and pat dry prior to dressing wounds Peri-Wound Care Topical Betadine Discharge Instruction: Apply betadine as  directed. Primary Dressing Secondary Dressing ABD Pad 5x9 (in/in) Discharge Instruction: Cover with ABD pad Kerlix 4.5 x 4.1 (in/yd) Discharge Instruction: Apply Kerlix 4.5 x 4.1 (in/yd) as instructed Secured With [place] H Soft Cloth Surgical Tape, 2x2 (in/yd) Compression Wrap Compression Stockings Add-Ons Electronic Signature(s) Signed: 10/18/2021 4:32:40 PM By: Levora Dredge Entered By: Levora Dredge on 10/18/2021 11:28:43 [name] [name] H. ([id number]) -------------------------------------------------------------------------------- Wound Assessment Details Patient Name: Robin, Michalla H. Date of Service: 10/18/2021 11:30 AM Medical Record Number: [id number] Patient Account Number: [id number] Date of Birth/Sex: February 02, 1951 (71 y.o. F) Treating RN: Levora Dredge Primary Care Marrion Accomando: Raelene [name] Other Clinician: Referring Shermon Bozzi: Raelene [name] Treating Jamieon Lannen/Extender: Skipper Cliche in Treatment: 8 Wound Status Wound Number: 3 Primary Pressure Ulcer Etiology: Wound Location: Right, Lateral Ankle Secondary Diabetic Wound/Ulcer of the Lower Extremity Wounding Event: Pressure Injury Etiology: Date Acquired: 07/21/2018 Wound Open Weeks Of Treatment: 8 Status: Clustered Wound:  No Comorbid Chronic sinus problems/congestion, Anemia, Chronic History: Obstructive Pulmonary Disease (COPD), Congestive Heart Failure, Hypertension, Peripheral Venous Disease, Type II Diabetes, Rheumatoid Arthritis, Neuropathy Photos Wound Measurements Length: (cm) 0.5 Width: (cm) 0.5 Depth: (cm) 0.1 Area: (cm) 0.196 Volume: (cm) 0.02 % Reduction in Area: 69.2% % Reduction in Volume: 68.8% Epithelialization: None Tunneling: No Undermining: No Wound Description Classification: Unstageable/Unclassified Exudate Amount: None Present Foul Odor After Cleansing: No Slough/Fibrino Yes Wound Bed Granulation Amount: None Present (0%) Exposed Structure Necrotic  Amount: Large (67-100%) Fascia Exposed: No Necrotic Quality: Eschar Fat Layer (Subcutaneous Tissue) Exposed: Yes Tendon Exposed: No Muscle Exposed: No Joint Exposed: No Bone Exposed: No Treatment Notes Wound #3 (Ankle) Wound Laterality: Right, Lateral Cleanser Normal Saline Discharge Instruction: Wash your hands with soap and water. Remove old dressing, discard into plastic bag and place into trash. Cleanse the wound with Normal Saline prior to applying a clean dressing using gauze sponges, not tissues or cotton balls. Do not Liford, Stephen H. ([id number]) scrub or use excessive force. Pat dry using gauze sponges, not tissue or cotton balls. Soap and Water Discharge Instruction: Gently cleanse wound with antibacterial soap, rinse and pat dry prior to dressing wounds Peri-Wound Care Topical Betadine Discharge Instruction: Apply betadine as directed. Primary Dressing Secondary Dressing ABD Pad 5x9 (in/in) Discharge Instruction: Cover with ABD pad Kerlix 4.5 x 4.1 (in/yd) Discharge Instruction: Apply Kerlix 4.5 x 4.1 (in/yd) as instructed Secured With [place] H Soft Cloth Surgical Tape, 2x2 (in/yd) Compression Wrap Compression Stockings Add-Ons Electronic Signature(s) Signed: 10/18/2021 4:32:40 PM By: Levora Dredge Entered By: Levora Dredge on 10/18/2021 11:29:17 Perkey, Kamisha H. ([id number]) -------------------------------------------------------------------------------- Wound Assessment Details Patient Name: Crockett, Armya H. Date of Service: 10/18/2021 11:30 AM Medical Record Number: [id number] Patient Account Number: [id number] Date of Birth/Sex: 1951/03/03 (71 y.o. F) Treating RN: Levora Dredge Primary Care Britanie Harshman: Raelene [name] Other Clinician: Referring Karly Pitter: Raelene [name] Treating Lizmarie Witters/Extender: Skipper Cliche in Treatment: 8 Wound Status Wound Number: 4 Primary Pressure Ulcer Etiology: Wound Location: Right, Lateral  Foot Secondary Diabetic Wound/Ulcer of the Lower Extremity Wounding Event: Pressure Injury Etiology: Date Acquired: 07/21/2018 Wound Open Weeks Of Treatment: 8 Status: Clustered Wound: No Comorbid Chronic sinus problems/congestion, Anemia, Chronic History: Obstructive Pulmonary Disease (COPD), Congestive Heart Failure, Hypertension, Peripheral Venous Disease, Type II Diabetes, Rheumatoid Arthritis, Neuropathy Photos Wound Measurements Length: (cm) 2.7 Width: (cm) 3 Depth: (cm) 0.1 Area: (cm) 6.362 Volume: (cm) 0.636 % Reduction in Area: 6.2% % Reduction in Volume: 6.3% Epithelialization: None Tunneling: No Undermining: No Wound Description Classification: Unstageable/Unclassified Exudate Amount: None Present Foul Odor After Cleansing: No Slough/Fibrino Yes Wound Bed Granulation Amount: None Present (0%) Exposed Structure Necrotic Amount: Large (67-100%) Fascia Exposed: No Necrotic Quality: Eschar Fat Layer (Subcutaneous Tissue) Exposed: Yes Tendon Exposed: No Muscle Exposed: No Joint Exposed: No Bone Exposed: No Treatment Notes Wound #4 (Foot) Wound Laterality: Right, Lateral Cleanser Normal Saline Discharge Instruction: Wash your hands with soap and water. Remove old dressing, discard into plastic bag and place into trash. Cleanse the wound with Normal Saline prior to applying a clean dressing using gauze sponges, not tissues or cotton balls. Do not Bouchard, Jull H. ([id number]) scrub or use excessive force. Pat dry using gauze sponges, not tissue or cotton balls. Soap and Water Discharge Instruction: Gently cleanse wound with antibacterial soap, rinse and pat dry prior to dressing wounds Peri-Wound Care Topical Betadine Discharge Instruction: Apply betadine as directed. Primary Dressing Secondary Dressing ABD Pad 5x9 (in/in) Discharge Instruction: Cover with ABD  pad Kerlix 4.5 x 4.1 (in/yd) Discharge Instruction: Apply Kerlix 4.5 x 4.1 (in/yd) as  instructed Secured With [place] H Soft Cloth Surgical Tape, 2x2 (in/yd) Compression Wrap Compression Stockings Add-Ons Electronic Signature(s) Signed: 10/18/2021 4:32:40 PM By: Levora Dredge Entered By: Levora Dredge on 10/18/2021 11:29:55 Sine, Gabrianna H. ([id number]) -------------------------------------------------------------------------------- Wound Assessment Details Patient Name: Batt, Breezie H. Date of Service: 10/18/2021 11:30 AM Medical Record Number: [id number] Patient Account Number: [id number] Date of Birth/Sex: 1951-05-09 (71 y.o. F) Treating RN: Levora Dredge Primary Care Mardelle Pandolfi: Raelene [name] Other Clinician: Referring [employer]: Raelene [name] Treating Jennife Zaucha/Extender: Skipper Cliche in Treatment: 8 Wound Status Wound Number: 5 Primary Diabetic Wound/Ulcer of the Lower Extremity Etiology: Wound Location: Left, Posterior Lower Leg Secondary Venous Leg Ulcer Wounding Event: Gradually Appeared Etiology: Date Acquired: 09/19/2021 Wound Healed - Epithelialized Weeks Of Treatment: 3 Status: Clustered Wound: No Comorbid Chronic sinus problems/congestion, Anemia, Chronic History: Obstructive Pulmonary Disease (COPD), Congestive Heart Failure, Hypertension, Peripheral Venous Disease, Type II Diabetes, Rheumatoid Arthritis, Neuropathy Photos Wound Measurements Length: (cm) 0 % Width: (cm) 0 % Depth: (cm) 0 Ep Area: (cm) 0 T Volume: (cm) 0 U Reduction in Area: 100% Reduction in Volume: 100% ithelialization: Large (67-100%) unneling: No ndermining: No Wound Description Classification: Grade 1 Fo Exudate Amount: None Present Sl ul Odor After Cleansing: No ough/Fibrino No Wound Bed Granulation Amount: None Present (0%) Necrotic Amount: None Present (0%) Treatment Notes Wound #5 (Lower Leg) Wound Laterality: Left, Posterior Cleanser Peri-Wound Care Topical Primary Dressing Secondary Dressing Secured With CYNAI, [employer] ([id number]) Compression Wrap Compression Stockings Add-Ons Electronic Signature(s) Signed: 10/18/2021 4:32:40 PM By: Levora Dredge Entered By: Levora Dredge on 10/18/2021 11:47:13 Pujol, Jahira H. ([id number]) -------------------------------------------------------------------------------- Vitals Details Patient Name: Leske, Korbin H. Date of Service: 10/18/2021 11:30 AM Medical Record Number: [id number] Patient Account Number: [id number] Date of Birth/Sex: [birth year] (71 y.o. F) Treating RN: Levora Dredge Primary Care Dezyrae Kensinger: Raelene [name] Other Clinician: Referring Tyshawn Keel: Raelene [name] Treating Arieona Swaggerty/Extender: Skipper Cliche in Treatment: 8 Vital Signs Time Taken: 11:13 Temperature (F): 98.1 Height (in): 64 Pulse (bpm): 86 Weight (lbs): 142 Respiratory Rate (breaths/min): 18 Body Mass Index (BMI): 24.4 Blood Pressure (mmHg): 114/74 Reference Range: 80 - 120 mg / dl Electronic Signature(s) Signed: 10/18/2021 4:32:40 PM By: Levora Dredge Entered By: Levora Dredge on 10/18/2021 11:14:41

## 2021-11-08 ENCOUNTER — Encounter: Payer: Medicare Other | Attending: Physician Assistant | Admitting: Physician Assistant

## 2021-11-08 DIAGNOSIS — I2510 Atherosclerotic heart disease of native coronary artery without angina pectoris: Secondary | ICD-10-CM | POA: Insufficient documentation

## 2021-11-08 DIAGNOSIS — L89510 Pressure ulcer of right ankle, unstageable: Secondary | ICD-10-CM | POA: Diagnosis not present

## 2021-11-08 DIAGNOSIS — L89620 Pressure ulcer of left heel, unstageable: Secondary | ICD-10-CM | POA: Diagnosis not present

## 2021-11-08 DIAGNOSIS — E1151 Type 2 diabetes mellitus with diabetic peripheral angiopathy without gangrene: Secondary | ICD-10-CM | POA: Diagnosis not present

## 2021-11-08 DIAGNOSIS — N1831 Chronic kidney disease, stage 3a: Secondary | ICD-10-CM | POA: Diagnosis not present

## 2021-11-08 DIAGNOSIS — E11621 Type 2 diabetes mellitus with foot ulcer: Secondary | ICD-10-CM | POA: Diagnosis present

## 2021-11-08 DIAGNOSIS — I130 Hypertensive heart and chronic kidney disease with heart failure and stage 1 through stage 4 chronic kidney disease, or unspecified chronic kidney disease: Secondary | ICD-10-CM | POA: Insufficient documentation

## 2021-11-08 DIAGNOSIS — L89610 Pressure ulcer of right heel, unstageable: Secondary | ICD-10-CM | POA: Diagnosis not present

## 2021-11-08 DIAGNOSIS — I872 Venous insufficiency (chronic) (peripheral): Secondary | ICD-10-CM | POA: Diagnosis not present

## 2021-11-08 DIAGNOSIS — J449 Chronic obstructive pulmonary disease, unspecified: Secondary | ICD-10-CM | POA: Insufficient documentation

## 2021-11-08 DIAGNOSIS — I5042 Chronic combined systolic (congestive) and diastolic (congestive) heart failure: Secondary | ICD-10-CM | POA: Insufficient documentation

## 2021-11-08 DIAGNOSIS — L89890 Pressure ulcer of other site, unstageable: Secondary | ICD-10-CM | POA: Insufficient documentation

## 2021-11-08 DIAGNOSIS — E1122 Type 2 diabetes mellitus with diabetic chronic kidney disease: Secondary | ICD-10-CM | POA: Insufficient documentation

## 2021-11-08 DIAGNOSIS — M069 Rheumatoid arthritis, unspecified: Secondary | ICD-10-CM | POA: Insufficient documentation

## 2021-11-08 DIAGNOSIS — E1140 Type 2 diabetes mellitus with diabetic neuropathy, unspecified: Secondary | ICD-10-CM | POA: Diagnosis not present

## 2021-11-08 NOTE — Progress Notes (Signed)
JILLANE, PO ([id number]) Visit Report for 11/08/2021 Chief Complaint Document Details Patient Name: Patricia [name], Patricia [name]. Date of Service: 11/08/2021 11:30 AM Medical Record Number: [id number] Patient Account Number: [id number] Date of Birth/Sex: 1950-07-20 (71 y.o. F) Treating RN: Angelina Pih Primary Care Provider: Lindwood Qua Other Clinician: Referring Provider: Lindwood Qua Treating Provider/Extender: Rowan Blase in Treatment: 11 Information Obtained from: Patient Chief Complaint Bilateral Foot Ulcers Electronic Signature(s) Signed: 11/08/2021 12:02:50 PM By: Lenda Kelp PA-C Entered By: Lenda Kelp on 11/08/2021 12:02:50 Goltz, Darrell Rexene Edison ([id number]) -------------------------------------------------------------------------------- Physician Orders Details Patient Name: Patricia [name], Shantanique H. Date of Service: 11/08/2021 11:30 AM Medical Record Number: [id number] Patient Account Number: [id number] Date of Birth/Sex: Sep 29, 1950 (71 y.o. F) Treating RN: Angelina Pih Primary Care Provider: Lindwood Qua Other Clinician: Referring Provider: Lindwood Qua Treating Provider/Extender: Rowan Blase in Treatment: 11 Verbal / Phone Orders: No Diagnosis Coding ICD-10 Coding Code Description L89.620 Pressure ulcer of left heel, unstageable L89.610 Pressure ulcer of right heel, unstageable L89.510 Pressure ulcer of right ankle, unstageable L89.890 Pressure ulcer of other site, unstageable I73.89 Other specified peripheral vascular diseases E11.621 Type 2 diabetes mellitus with foot ulcer N18.31 Chronic kidney disease, stage 3a I25.10 Atherosclerotic heart disease of native coronary artery without angina pectoris I87.2 Venous insufficiency (chronic) (peripheral) I50.42 Chronic combined systolic (congestive) and diastolic (congestive) heart failure Follow-up Appointments o Return Appointment in 3 weeks. o Nurse Visit as needed Bathing/ Shower/ Hygiene o  May shower; gently cleanse wound with antibacterial soap, rinse and pat dry prior to dressing wounds o No tub bath. Anesthetic (Use 'Patient Medications' Section for Anesthetic Order Entry) o Lidocaine applied to wound bed Edema Control - Lymphedema / Segmental Compressive Device / Other o DO YOUR BEST to sleep in the bed at night. DO NOT sleep in your recliner. Long hours of sitting in a recliner leads to swelling of the legs and/or potential wounds on your backside. Off-Loading o Turn and reposition every 2 hours - please keep heels off bed when laying to relieve pressure Wound Treatment Wound #1 - Calcaneus Wound Laterality: Left Cleanser: Normal Saline 1 x Per Day/30 Days Discharge Instructions: Wash your hands with soap and water. Remove old dressing, discard into plastic bag and place into trash. Cleanse the wound with Normal Saline prior to applying a clean dressing using gauze sponges, not tissues or cotton balls. Do not scrub or use excessive force. Pat dry using gauze sponges, not tissue or cotton balls. Cleanser: Soap and Water 1 x Per Day/30 Days Discharge Instructions: Gently cleanse wound with antibacterial soap, rinse and pat dry prior to dressing wounds Topical: Santyl Collagenase Ointment, 30 (gm), tube 1 x Per Day/30 Days Discharge Instructions: apply nickel thick to wound bed only Primary Dressing: Gauze 1 x Per Day/30 Days Discharge Instructions: As directed: moistened with saline Secondary Dressing: ABD Pad 5x9 (in/in) 1 x Per Day/30 Days Discharge Instructions: Cover with ABD pad Secondary Dressing: Kerlix 4.5 x 4.1 (in/yd) 1 x Per Day/30 Days Discharge Instructions: Apply Kerlix 4.5 x 4.1 (in/yd) as instructed Wilcoxen, Stephanny H. ([id number]) Secured With: Medipore Tape - 76M Medipore H Soft Cloth Surgical Tape, 2x2 (in/yd) 1 x Per Day/30 Days Wound #2 - Calcaneus Wound Laterality: Right Cleanser: Normal Saline 1 x Per Day/30 Days Discharge Instructions: Wash  your hands with soap and water. Remove old dressing, discard into plastic bag and place into trash. Cleanse the wound with Normal Saline prior to applying a clean dressing using gauze sponges, not tissues or  cotton balls. Do not scrub or use excessive force. Pat dry using gauze sponges, not tissue or cotton balls. Cleanser: Soap and Water 1 x Per Day/30 Days Discharge Instructions: Gently cleanse wound with antibacterial soap, rinse and pat dry prior to dressing wounds Topical: Santyl Collagenase Ointment, 30 (gm), tube 1 x Per Day/30 Days Discharge Instructions: apply nickel thick to wound bed only Primary Dressing: Gauze 1 x Per Day/30 Days Discharge Instructions: As directed: moistened with saline Secondary Dressing: ABD Pad 5x9 (in/in) 1 x Per Day/30 Days Discharge Instructions: Cover with ABD pad Secondary Dressing: Kerlix 4.5 x 4.1 (in/yd) 1 x Per Day/30 Days Discharge Instructions: Apply Kerlix 4.5 x 4.1 (in/yd) as instructed Secured With: Medipore Tape - 170M Medipore H Soft Cloth Surgical Tape, 2x2 (in/yd) 1 x Per Day/30 Days Wound #3 - Ankle Wound Laterality: Right, Lateral Cleanser: Normal Saline 1 x Per Day/30 Days Discharge Instructions: Wash your hands with soap and water. Remove old dressing, discard into plastic bag and place into trash. Cleanse the wound with Normal Saline prior to applying a clean dressing using gauze sponges, not tissues or cotton balls. Do not scrub or use excessive force. Pat dry using gauze sponges, not tissue or cotton balls. Cleanser: Soap and Water 1 x Per Day/30 Days Discharge Instructions: Gently cleanse wound with antibacterial soap, rinse and pat dry prior to dressing wounds Topical: Santyl Collagenase Ointment, 30 (gm), tube 1 x Per Day/30 Days Discharge Instructions: apply nickel thick to wound bed only Primary Dressing: Gauze 1 x Per Day/30 Days Discharge Instructions: As directed: moistened with saline Secondary Dressing: ABD Pad 5x9 (in/in) 1  x Per Day/30 Days Discharge Instructions: Cover with ABD pad Secondary Dressing: Kerlix 4.5 x 4.1 (in/yd) 1 x Per Day/30 Days Discharge Instructions: Apply Kerlix 4.5 x 4.1 (in/yd) as instructed Secured With: Medipore Tape - 170M Medipore H Soft Cloth Surgical Tape, 2x2 (in/yd) 1 x Per Day/30 Days Wound #4 - Foot Wound Laterality: Right, Lateral Cleanser: Normal Saline 1 x Per Day/30 Days Discharge Instructions: Wash your hands with soap and water. Remove old dressing, discard into plastic bag and place into trash. Cleanse the wound with Normal Saline prior to applying a clean dressing using gauze sponges, not tissues or cotton balls. Do not scrub or use excessive force. Pat dry using gauze sponges, not tissue or cotton balls. Cleanser: Soap and Water 1 x Per Day/30 Days Discharge Instructions: Gently cleanse wound with antibacterial soap, rinse and pat dry prior to dressing wounds Topical: Santyl Collagenase Ointment, 30 (gm), tube 1 x Per Day/30 Days Discharge Instructions: apply nickel thick to wound bed only Primary Dressing: Gauze 1 x Per Day/30 Days Discharge Instructions: As directed: moistened with saline Secondary Dressing: ABD Pad 5x9 (in/in) 1 x Per Day/30 Days Discharge Instructions: Cover with ABD pad Felker, Shelby H. ([id number]) Secondary Dressing: Kerlix 4.5 x 4.1 (in/yd) 1 x Per Day/30 Days Discharge Instructions: Apply Kerlix 4.5 x 4.1 (in/yd) as instructed Secured With: Medipore Tape - 170M Medipore H Soft Cloth Surgical Tape, 2x2 (in/yd) 1 x Per Day/30 Days Electronic Signature(s) Unsigned Entered By: Angelina Pih on 11/08/2021 12:13:45 Signature(s): Date(s): Mcwilliams, Jeneen Rexene Edison ([id number]) -------------------------------------------------------------------------------- Problem List Details Patient Name: SULLIVANT, Sammye H. Date of Service: 11/08/2021 11:30 AM Medical Record Number: [id number] Patient Account Number: [id number] Date of Birth/Sex: 07-19-1950 (71 y.o.  F) Treating RN: Angelina Pih Primary Care Provider: Lindwood Qua Other Clinician: Referring Provider: Lindwood Qua Treating Provider/Extender: Rowan Blase in Treatment: 11 Active Problems ICD-10  Encounter Code Description Active Date MDM Diagnosis L89.620 Pressure ulcer of left heel, unstageable 08/18/2021 No Yes L89.610 Pressure ulcer of right heel, unstageable 08/18/2021 No Yes L89.510 Pressure ulcer of right ankle, unstageable 08/18/2021 No Yes L89.890 Pressure ulcer of other site, unstageable 08/18/2021 No Yes I73.89 Other specified peripheral vascular diseases 08/18/2021 No Yes E11.621 Type 2 diabetes mellitus with foot ulcer 08/18/2021 No Yes N18.31 Chronic kidney disease, stage 3a 08/18/2021 No Yes I25.10 Atherosclerotic heart disease of native coronary artery without angina 08/18/2021 No Yes pectoris I87.2 Venous insufficiency (chronic) (peripheral) 08/18/2021 No Yes I50.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 ([id number])

## 2021-11-08 NOTE — Progress Notes (Signed)
JAMILA, SLATTEN ([id number]) Visit Report for 11/08/2021 Arrival Information Details Patient Name: Patricia [name], Patricia [name]. Date of Service: 11/08/2021 11:30 AM Medical Record Number: [id number] Patient Account Number: [id number] Date of Birth/Sex: 12-Jun-1950 (71 y.o. F) Treating RN: Levora Dredge Primary Care Provider: Raelene Bott Other Clinician: Referring Provider: Raelene Bott Treating Provider/Extender: Skipper Cliche in Treatment: 11 Visit Information History Since Last Visit Added or deleted any medications: No Patient Arrived: Wheel Chair Any new allergies or adverse reactions: No Arrival Time: 11:29 Had a fall or experienced change in No Accompanied By: staff activities of daily living that may affect Transfer Assistance: EasyPivot Patient Lift risk of falls: Patient Identification Verified: Yes Hospitalized since last visit: No Secondary Verification Process Completed: Yes Has Dressing in Place as Prescribed: Yes Patient Requires Transmission-Based No Pain Present Now: Yes Precautions: Patient Has Alerts: Yes Patient Alerts: Patient on Blood Thinner type 2 diabetic [employer] Signature(s) Signed: 11/08/2021 4:38:54 PM By: Levora Dredge Entered By: Levora Dredge on 11/08/2021 11:34:38 Cino, Terryn Lemmie Evens ([id number]) -------------------------------------------------------------------------------- Clinic Level of Care Assessment Details Patient Name: BOLLER, Aireona H. Date of Service: 11/08/2021 11:30 AM Medical Record Number: [id number] Patient Account Number: [id number] Date of Birth/Sex: 1951-01-13 (71 y.o. F) Treating RN: Levora Dredge Primary Care Provider: Raelene Bott Other Clinician: Referring Provider: Raelene Bott Treating Provider/Extender: Skipper Cliche in Treatment: 11 Clinic Level of Care Assessment Items TOOL 1 Quantity Score [] - Use when EandM and Procedure is performed on INITIAL visit 0 ASSESSMENTS -  Nursing Assessment / Reassessment [] - General Physical Exam (combine w/ comprehensive assessment (listed just below) when performed on new 0 pt. evals) [] - 0 Comprehensive Assessment (HX, ROS, Risk Assessments, Wounds Hx, etc.) ASSESSMENTS - Wound and Skin Assessment / Reassessment [] - Dermatologic / Skin Assessment (not related to wound area) 0 ASSESSMENTS - Ostomy and/or Continence Assessment and Care [] - Incontinence Assessment and Management 0 [] - 0 Ostomy Care Assessment and Management (repouching, etc.) PROCESS - Coordination of Care [] - Simple Patient / Family Education for ongoing care 0 [] - 0 Complex (extensive) Patient / Family Education for ongoing care [] - 0 Staff obtains Programmer, systems, Records, Test Results / Process Orders [] - 0 Staff telephones HHA, Nursing Homes / Clarify orders / etc [] - 0 Routine Transfer to another Facility (non-emergent condition) [] - 0 Routine Hospital Admission (non-emergent condition) [] - 0 New Admissions / Biomedical engineer / Ordering NPWT, Apligraf, etc. [] - 0 Emergency Hospital Admission (emergent condition) PROCESS - Special Needs [] - Pediatric / Minor Patient Management 0 [] - 0 Isolation Patient Management [] - 0 Hearing / Language / Visual special needs [] - 0 Assessment of Community assistance (transportation, D/C planning, etc.) [] - 0 Additional assistance / Altered mentation [] - 0 Support Surface(s) Assessment (bed, cushion, seat, etc.) INTERVENTIONS - Miscellaneous [] - External ear exam 0 [] - 0 Patient Transfer (multiple staff / Civil Service fast streamer / Similar devices) [] - 0 Simple Staple / Suture removal (25 or less) [] - 0 Complex Staple / Suture removal (26 or more) [] - 0 Hypo/Hyperglycemic Management (do not check if billed separately) [] - 0 Ankle / Brachial Index (ABI) - do not check if billed separately Has the patient been seen at the hospital within the last three years: Yes Total Score:  0 Level Of Care: ____ Chip Boer ([id number]) Electronic Signature(s) Signed: 11/08/2021 4:38:54 PM By: Levora Dredge Entered By: Levora Dredge on  11/08/2021 12:55:22 HERMINE, FERIA ([id number]) -------------------------------------------------------------------------------- Encounter Discharge Information Details Patient Name: RUPPEL, Franchon H. Date of Service: 11/08/2021 11:30 AM Medical Record Number: [id number] Patient Account Number: [id number] Date of Birth/Sex: 1951-01-22 (71 y.o. F) Treating RN: Levora Dredge Primary Care Provider: Raelene Bott Other Clinician: Referring Provider: Raelene Bott Treating Provider/Extender: Skipper Cliche in Treatment: 11 Encounter Discharge Information Items Post Procedure Vitals Discharge Condition: Stable Temperature (F): 97.6 Ambulatory Status: Wheelchair Pulse (bpm): 99 Discharge Destination: [place] Respiratory Rate (breaths/min): 18 Telephoned: No Blood Pressure (mmHg): 135/71 Orders Sent: Yes Transportation: Other Accompanied By: staff Schedule Follow-up Appointment: Yes Clinical Summary of Care: Electronic Signature(s) Signed: 11/08/2021 12:57:31 PM By: Levora Dredge Entered By: Levora Dredge on 11/08/2021 12:57:30 Shuttleworth, Aristea H. ([id number]) -------------------------------------------------------------------------------- Lower Extremity Assessment Details Patient Name: Tiberio, Sapna H. Date of Service: 11/08/2021 11:30 AM Medical Record Number: [id number] Patient Account Number: [id number] Date of Birth/Sex: 16-Aug-1950 (71 y.o. F) Treating RN: Levora Dredge Primary Care Provider: Raelene Bott Other Clinician: Referring Provider: Raelene Bott Treating Provider/Extender: Skipper Cliche in Treatment: 11 Edema Assessment Assessed: [Left: No] [Right: No] Edema: [Left: Yes] [Right: Yes] Calf Left: Right: Point of Measurement: 30 cm From Medial Instep 36.5 cm 37.2  cm Ankle Left: Right: Point of Measurement: 9 cm From Medial Instep 22 cm 22 cm Vascular Assessment Pulses: Dorsalis Pedis Palpable: [Left:No] [Right:No] Electronic Signature(s) Signed: 11/08/2021 4:38:54 PM By: Levora Dredge Entered By: Levora Dredge on 11/08/2021 12:10:17 Stmarie, Vinette H. ([id number]) -------------------------------------------------------------------------------- Multi Wound Chart Details Patient Name: Amrein, Jaidence H. Date of Service: 11/08/2021 11:30 AM Medical Record Number: [id number] Patient Account Number: [id number] Date of Birth/Sex: [birth year] (71 y.o. F) Treating RN: Levora Dredge Primary Care Provider: Raelene Bott Other Clinician: Referring Provider: Raelene Bott Treating Provider/Extender: Skipper Cliche in Treatment: 11 Vital Signs Height(in): 13 Pulse(bpm): 99 Weight(lbs): 142 Blood Pressure(mmHg): 135/71 Body Mass Index(BMI): 24.4 Temperature(F): 97.6 Respiratory Rate(breaths/min): 18 Photos: Wound Location: Left Calcaneus Right Calcaneus Right, Lateral Ankle Wounding Event: Pressure Injury Pressure Injury Pressure Injury Primary Etiology: Pressure Ulcer Pressure Ulcer Pressure Ulcer Secondary Etiology: N/A Diabetic Wound/Ulcer of the Lower Diabetic Wound/Ulcer of the Lower Extremity Extremity Comorbid History: Chronic sinus problems/congestion, Chronic sinus problems/congestion, Chronic sinus problems/congestion, Anemia, Chronic Obstructive Anemia, Chronic Obstructive Anemia, Chronic Obstructive Pulmonary Disease (COPD), Pulmonary Disease (COPD), Pulmonary Disease (COPD), Congestive Heart Failure, Congestive Heart Failure, Congestive Heart Failure, Hypertension, Peripheral Venous Hypertension, Peripheral Venous Hypertension, Peripheral Venous Disease, Type II Diabetes, Disease, Type II Diabetes, Disease, Type II Diabetes, Rheumatoid Arthritis, Neuropathy Rheumatoid Arthritis, Neuropathy Rheumatoid Arthritis, Neuropathy Date  Acquired: 05/22/2021 05/22/2021 07/21/2018 Weeks of Treatment: _0 Wound Status: Open Open Open Wound Recurrence: No No No Measurements L x W x D (cm) 2.3x2.5x0.1 1.5x2.2x0.1 0.8x0.6x0.1 Area (cm) : 4.516 2.592 0.377 Volume (cm) : 0.452 0.259 0.038 % Reduction in Area: 4.20% 8.30% 40.70% % Reduction in Volume: 4.00% 8.50% 40.60% Classification: Unstageable/Unclassified Unstageable/Unclassified Unstageable/Unclassified Exudate Amount: None Present None Present None Present Granulation Amount: None Present (0%) None Present (0%) None Present (0%) Necrotic Amount: Large (67-100%) Large (67-100%) Large (67-100%) Necrotic Tissue: Eschar Eschar Adherent Slough Exposed Structures: Fat Layer (Subcutaneous Tissue): Fat Layer (Subcutaneous Tissue): Fat Layer (Subcutaneous Tissue): No Yes Yes Fascia: No Fascia: No Tendon: No Tendon: No Muscle: No Muscle: No Joint: No Joint: No Bone: No Bone: No Epithelialization: None None None Wound Number: 4 N/A N/A Photos: N/A N/A Brandstetter, Gudrun H. ([id number]) Wound Location: Right, Lateral Foot N/A N/A Wounding Event: Pressure Injury N/A N/A Primary  Etiology: Pressure Ulcer N/A N/A Secondary Etiology: Diabetic Wound/Ulcer of the Lower N/A N/A Extremity Comorbid History: Chronic sinus problems/congestion, N/A N/A Anemia, Chronic Obstructive Pulmonary Disease (COPD), Congestive Heart Failure, Hypertension, Peripheral Venous Disease, Type II Diabetes, Rheumatoid Arthritis, Neuropathy Date Acquired: 07/21/2018 N/A N/A Weeks of Treatment: 11 N/A N/A Wound Status: Open N/A N/A Wound Recurrence: No N/A N/A Measurements L x W x D (cm) 3.4x3.6x0.2 N/A N/A Area (cm) : 9.613 N/A N/A Volume (cm) : 1.923 N/A N/A % Reduction in Area: -41.70% N/A N/A % Reduction in Volume: -183.20% N/A N/A Classification: Unstageable/Unclassified N/A N/A Exudate Amount: None Present N/A N/A Granulation Amount: None Present (0%) N/A N/A Necrotic Amount: Large  (67-100%) N/A N/A Necrotic Tissue: Eschar N/A N/A Exposed Structures: Fat Layer (Subcutaneous Tissue): N/A N/A Yes Fascia: No Tendon: No Muscle: No Joint: No Bone: No Epithelialization: None N/A N/A Treatment Notes Electronic Signature(s) Signed: 11/08/2021 4:38:54 PM By: Levora Dredge Entered By: Levora Dredge on 11/08/2021 12:10:45 Podoll, Kalayah Lemmie Evens ([id number]) -------------------------------------------------------------------------------- Multi-Disciplinary Care Plan Details Patient Name: [name], Audi H. Date of Service: 11/08/2021 11:30 AM Medical Record Number: [id number] Patient Account Number: [id number] Date of Birth/Sex: 04/04/1951 (71 y.o. F) Treating RN: Levora Dredge Primary Care Provider: Raelene Bott Other Clinician: Referring Provider: Raelene Bott Treating Provider/Extender: Skipper Cliche in Treatment: 11 Active Inactive Pressure Nursing Diagnoses: Knowledge deficit related to causes and risk factors for pressure ulcer development Knowledge deficit related to management of pressures ulcers Potential for impaired tissue integrity related to pressure, friction, moisture, and shear Goals: Patient will remain free from development of additional pressure ulcers Date Initiated: 08/18/2021 Date Inactivated: 10/18/2021 Target Resolution Date: 08/20/2021 Goal Status: Met Patient will remain free of pressure ulcers Date Initiated: 08/18/2021 Date Inactivated: 11/08/2021 Target Resolution Date: 08/20/2021 Goal Status: Met Patient/caregiver will verbalize risk factors for pressure ulcer development Date Initiated: 08/18/2021 Target Resolution Date: 08/18/2021 Goal Status: Active Patient/caregiver will verbalize understanding of pressure ulcer management Date Initiated: 08/18/2021 Date Inactivated: 09/12/2021 Target Resolution Date: 08/18/2021 Goal Status: Met Interventions: Assess: immobility, friction, shearing, incontinence upon admission and as  needed Assess offloading mechanisms upon admission and as needed Assess potential for pressure ulcer upon admission and as needed Provide education on pressure ulcers Notes: Wound/Skin Impairment Nursing Diagnoses: Impaired tissue integrity Knowledge deficit related to ulceration/compromised skin integrity Goals: Ulcer/skin breakdown will have a volume reduction of 30% by week 4 Date Initiated: 08/18/2021 Target Resolution Date: 09/15/2021 Goal Status: Active Ulcer/skin breakdown will have a volume reduction of 50% by week 8 Date Initiated: 08/18/2021 Target Resolution Date: 10/13/2021 Goal Status: Active Ulcer/skin breakdown will have a volume reduction of 80% by week 12 Date Initiated: 08/18/2021 Target Resolution Date: 11/10/2021 Goal Status: Active Ulcer/skin breakdown will heal within 14 weeks Date Initiated: 08/18/2021 Target Resolution Date: 11/24/2021 Goal Status: Active Interventions: Assess patient/caregiver ability to obtain necessary supplies Assess patient/caregiver ability to perform ulcer/skin care regimen upon admission and as needed AALIYAH, GAVEL ([id number]) Assess ulceration(s) every visit Provide education on ulcer and skin care Notes: Electronic Signature(s) Signed: 11/08/2021 4:38:54 PM By: Levora Dredge Entered By: Levora Dredge on 11/08/2021 12:10:34 Scribner, Armetta H. ([id number]) -------------------------------------------------------------------------------- Pain Assessment Details Patient Name: Beshears, Savvy H. Date of Service: 11/08/2021 11:30 AM Medical Record Number: [id number] Patient Account Number: [id number] Date of Birth/Sex: [birth year] (71 y.o. F) Treating RN: Levora Dredge Primary Care Provider: Raelene Bott Other Clinician: Referring Provider: Raelene Bott Treating Provider/Extender: Skipper Cliche in Treatment: 11 Active Problems Location of Pain Severity and Description of  Pain Patient Has Paino Yes Site Locations Rate  the pain. Current Pain Level: 6 Pain Management and Medication Current Pain Management: Notes pt states general pain in bilat feet Electronic Signature(s) Signed: 11/08/2021 4:38:54 PM By: Levora Dredge Entered By: Levora Dredge on 11/08/2021 11:37:55 Lenderman, Briel Lemmie Evens ([id number]) -------------------------------------------------------------------------------- Patient/Caregiver Education Details Patient Name: [name], Gloriann H. Date of Service: 11/08/2021 11:30 AM Medical Record Number: [id number] Patient Account Number: [id number] Date of Birth/Gender: [birth year] (71 y.o. F) Treating RN: Levora Dredge Primary Care Physician: Raelene Bott Other Clinician: Referring Physician: Raelene Bott Treating Physician/Extender: Skipper Cliche in Treatment: 11 Education Assessment Education Provided To: Patient and Caregiver Education Topics Provided Pressure: Handouts: Pressure Ulcers: Care and Offloading Methods: Explain/Verbal Responses: State content correctly Wound/Skin Impairment: Handouts: Caring for Your Ulcer Methods: Explain/Verbal Responses: State content correctly Electronic Signature(s) Signed: 11/08/2021 4:38:54 PM By: Levora Dredge Entered By: Levora Dredge on 11/08/2021 12:55:56 Rhines, Ruthe H. ([id number]) -------------------------------------------------------------------------------- Wound Assessment Details Patient Name: Brendel, Austyn H. Date of Service: 11/08/2021 11:30 AM Medical Record Number: [id number] Patient Account Number: [id number] Date of Birth/Sex: 12-Sep-1950 (71 y.o. F) Treating RN: Levora Dredge Primary Care Jalilah Wiltsie: Raelene Bott Other Clinician: Referring Hubert Derstine: Raelene Bott Treating Alaylah Heatherington/Extender: Skipper Cliche in Treatment: 11 Wound Status Wound Number: 1 Primary Pressure Ulcer Etiology: Wound Location: Left Calcaneus Wound Open Wounding Event: Pressure Injury Status: Date Acquired: 05/22/2021 Comorbid  Chronic sinus problems/congestion, Anemia, Chronic Weeks Of Treatment: 11 History: Obstructive Pulmonary Disease (COPD), Congestive Heart Clustered Wound: No Failure, Hypertension, Peripheral Venous Disease, Type II Diabetes, Rheumatoid Arthritis, Neuropathy Photos Wound Measurements Length: (cm) 2.3 Width: (cm) 2.5 Depth: (cm) 0.1 Area: (cm) 4.516 Volume: (cm) 0.452 % Reduction in Area: 4.2% % Reduction in Volume: 4% Epithelialization: None Tunneling: No Undermining: No Wound Description Classification: Unstageable/Unclassified Exudate Amount: None Present Foul Odor After Cleansing: No Slough/Fibrino Yes Wound Bed Granulation Amount: None Present (0%) Exposed Structure Necrotic Amount: Large (67-100%) Fat Layer (Subcutaneous Tissue) Exposed: No Necrotic Quality: Eschar Treatment Notes Wound #1 (Calcaneus) Wound Laterality: Left Cleanser Normal Saline Discharge Instruction: Wash your hands with soap and water. Remove old dressing, discard into plastic bag and place into trash. Cleanse the wound with Normal Saline prior to applying a clean dressing using gauze sponges, not tissues or cotton balls. Do not scrub or use excessive force. Pat dry using gauze sponges, not tissue or cotton balls. Soap and Water Discharge Instruction: Gently cleanse wound with antibacterial soap, rinse and pat dry prior to dressing wounds Peri-Wound Care KARISSA, MEENAN ([id number]) Topical Santyl Collagenase Ointment, 30 (gm), tube Discharge Instruction: apply nickel thick to wound bed only Primary Dressing Gauze Discharge Instruction: As directed: moistened with saline Secondary Dressing ABD Pad 5x9 (in/in) Discharge Instruction: Cover with ABD pad Kerlix 4.5 x 4.1 (in/yd) Discharge Instruction: Apply Kerlix 4.5 x 4.1 (in/yd) as instructed Secured With Adrian H Soft Cloth Surgical Tape, 2x2 (in/yd) Compression Wrap Compression Stockings Add-Ons Electronic  Signature(s) Signed: 11/08/2021 4:38:54 PM By: Levora Dredge Entered By: Levora Dredge on 11/08/2021 11:51:31 Starnes, Adie H. ([id number]) -------------------------------------------------------------------------------- Wound Assessment Details Patient Name: Oshima, Murrell H. Date of Service: 11/08/2021 11:30 AM Medical Record Number: [id number] Patient Account Number: [id number] Date of Birth/Sex: 1950/06/19 (71 y.o. F) Treating RN: Levora Dredge Primary Care Rayme Bui: Raelene Bott Other Clinician: Referring Griselle Rufer: Raelene Bott Treating Ilea Hilton/Extender: Skipper Cliche in Treatment: 11 Wound Status Wound Number: 2 Primary Pressure Ulcer Etiology: Wound Location: Right Calcaneus Secondary Diabetic Wound/Ulcer of the Lower Extremity Wounding Event:  Pressure Injury Etiology: Date Acquired: 05/22/2021 Wound Open Weeks Of Treatment: 11 Status: Clustered Wound: No Comorbid Chronic sinus problems/congestion, Anemia, Chronic History: Obstructive Pulmonary Disease (COPD), Congestive Heart Failure, Hypertension, Peripheral Venous Disease, Type II Diabetes, Rheumatoid Arthritis, Neuropathy Photos Wound Measurements Length: (cm) 1.5 Width: (cm) 2.2 Depth: (cm) 0.1 Area: (cm) 2.592 Volume: (cm) 0.259 % Reduction in Area: 8.3% % Reduction in Volume: 8.5% Epithelialization: None Tunneling: No Undermining: No Wound Description Classification: Unstageable/Unclassified Exudate Amount: None Present Foul Odor After Cleansing: No Slough/Fibrino Yes Wound Bed Granulation Amount: None Present (0%) Exposed Structure Necrotic Amount: Large (67-100%) Fascia Exposed: No Necrotic Quality: Eschar Fat Layer (Subcutaneous Tissue) Exposed: Yes Tendon Exposed: No Muscle Exposed: No Joint Exposed: No Bone Exposed: No Treatment Notes Wound #2 (Calcaneus) Wound Laterality: Right Cleanser Normal Saline Discharge Instruction: Wash your hands with soap and water. Remove old  dressing, discard into plastic bag and place into trash. Cleanse the wound with Normal Saline prior to applying a clean dressing using gauze sponges, not tissues or cotton balls. Do not Risenhoover, Maleigh H. ([id number]) scrub or use excessive force. Pat dry using gauze sponges, not tissue or cotton balls. Soap and Water Discharge Instruction: Gently cleanse wound with antibacterial soap, rinse and pat dry prior to dressing wounds Peri-Wound Care Topical Santyl Collagenase Ointment, 30 (gm), tube Discharge Instruction: apply nickel thick to wound bed only Primary Dressing Gauze Discharge Instruction: As directed: moistened with saline Secondary Dressing ABD Pad 5x9 (in/in) Discharge Instruction: Cover with ABD pad Kerlix 4.5 x 4.1 (in/yd) Discharge Instruction: Apply Kerlix 4.5 x 4.1 (in/yd) as instructed Secured With [place] H Soft Cloth Surgical Tape, 2x2 (in/yd) Compression Wrap Compression Stockings Add-Ons Electronic Signature(s) Signed: 11/08/2021 4:38:54 PM By: Levora Dredge Entered By: Levora Dredge on 11/08/2021 11:52:00 Sheek, Keeanna H. ([id number]) -------------------------------------------------------------------------------- Wound Assessment Details Patient Name: Fritts, Alecea H. Date of Service: 11/08/2021 11:30 AM Medical Record Number: [id number] Patient Account Number: [id number] Date of Birth/Sex: 1950-10-04 (71 y.o. F) Treating RN: Levora Dredge Primary Care Ebert Forrester: Raelene Bott Other Clinician: Referring Earnestene Angello: Raelene Bott Treating Tavius Turgeon/Extender: Skipper Cliche in Treatment: 11 Wound Status Wound Number: 3 Primary Pressure Ulcer Etiology: Wound Location: Right, Lateral Ankle Secondary Diabetic Wound/Ulcer of the Lower Extremity Wounding Event: Pressure Injury Etiology: Date Acquired: 07/21/2018 Wound Open Weeks Of Treatment: 11 Status: Clustered Wound: No Comorbid Chronic sinus problems/congestion, Anemia,  Chronic History: Obstructive Pulmonary Disease (COPD), Congestive Heart Failure, Hypertension, Peripheral Venous Disease, Type II Diabetes, Rheumatoid Arthritis, Neuropathy Photos Wound Measurements Length: (cm) 0.8 Width: (cm) 0.6 Depth: (cm) 0.1 Area: (cm) 0.377 Volume: (cm) 0.038 % Reduction in Area: 40.7% % Reduction in Volume: 40.6% Epithelialization: None Tunneling: No Undermining: No Wound Description Classification: Unstageable/Unclassified Exudate Amount: None Present Foul Odor After Cleansing: No Slough/Fibrino Yes Wound Bed Granulation Amount: None Present (0%) Exposed Structure Necrotic Amount: Large (67-100%) Fascia Exposed: No Necrotic Quality: Adherent Slough Fat Layer (Subcutaneous Tissue) Exposed: Yes Tendon Exposed: No Muscle Exposed: No Joint Exposed: No Bone Exposed: No Treatment Notes Wound #3 (Ankle) Wound Laterality: Right, Lateral Cleanser Normal Saline Discharge Instruction: Wash your hands with soap and water. Remove old dressing, discard into plastic bag and place into trash. Cleanse the wound with Normal Saline prior to applying a clean dressing using gauze sponges, not tissues or cotton balls. Do not Soderman, Jaslen H. ([id number]) scrub or use excessive force. Pat dry using gauze sponges, not tissue or cotton balls. Soap and Water Discharge Instruction: Gently cleanse wound with antibacterial soap,  rinse and pat dry prior to dressing wounds Peri-Wound Care Topical Santyl Collagenase Ointment, 30 (gm), tube Discharge Instruction: apply nickel thick to wound bed only Primary Dressing Gauze Discharge Instruction: As directed: moistened with saline Secondary Dressing ABD Pad 5x9 (in/in) Discharge Instruction: Cover with ABD pad Kerlix 4.5 x 4.1 (in/yd) Discharge Instruction: Apply Kerlix 4.5 x 4.1 (in/yd) as instructed Secured With [place] H Soft Cloth Surgical Tape, 2x2 (in/yd) Compression Wrap Compression  Stockings Add-Ons Electronic Signature(s) Signed: 11/08/2021 4:38:54 PM By: Levora Dredge Entered By: Levora Dredge on 11/08/2021 11:52:30 Lyssy, Oriel H. ([id number]) -------------------------------------------------------------------------------- Wound Assessment Details Patient Name: Ludolph, Sanita H. Date of Service: 11/08/2021 11:30 AM Medical Record Number: [id number] Patient Account Number: [id number] Date of Birth/Sex: Jan 19, 1951 (71 y.o. F) Treating RN: Levora Dredge Primary Care Provider: Raelene Bott Other Clinician: Referring Provider: Raelene Bott Treating Provider/Extender: Skipper Cliche in Treatment: 11 Wound Status Wound Number: 4 Primary Pressure Ulcer Etiology: Wound Location: Right, Lateral Foot Secondary Diabetic Wound/Ulcer of the Lower Extremity Wounding Event: Pressure Injury Etiology: Date Acquired: 07/21/2018 Wound Open Weeks Of Treatment: 11 Status: Clustered Wound: No Comorbid Chronic sinus problems/congestion, Anemia, Chronic History: Obstructive Pulmonary Disease (COPD), Congestive Heart Failure, Hypertension, Peripheral Venous Disease, Type II Diabetes, Rheumatoid Arthritis, Neuropathy Photos Wound Measurements Length: (cm) 3.4 Width: (cm) 3.6 Depth: (cm) 0.2 Area: (cm) 9.613 Volume: (cm) 1.923 % Reduction in Area: -41.7% % Reduction in Volume: -183.2% Epithelialization: None Tunneling: No Undermining: No Wound Description Classification: Unstageable/Unclassified Exudate Amount: None Present Foul Odor After Cleansing: No Slough/Fibrino Yes Wound Bed Granulation Amount: None Present (0%) Exposed Structure Necrotic Amount: Large (67-100%) Fascia Exposed: No Necrotic Quality: Eschar Fat Layer (Subcutaneous Tissue) Exposed: Yes Tendon Exposed: No Muscle Exposed: No Joint Exposed: No Bone Exposed: No Treatment Notes Wound #4 (Foot) Wound Laterality: Right, Lateral Cleanser Normal Saline Discharge Instruction: Wash  your hands with soap and water. Remove old dressing, discard into plastic bag and place into trash. Cleanse the wound with Normal Saline prior to applying a clean dressing using gauze sponges, not tissues or cotton balls. Do not Claudio, Caila H. ([id number]) scrub or use excessive force. Pat dry using gauze sponges, not tissue or cotton balls. Soap and Water Discharge Instruction: Gently cleanse wound with antibacterial soap, rinse and pat dry prior to dressing wounds Peri-Wound Care Topical Santyl Collagenase Ointment, 30 (gm), tube Discharge Instruction: apply nickel thick to wound bed only Primary Dressing Gauze Discharge Instruction: As directed: moistened with saline Secondary Dressing ABD Pad 5x9 (in/in) Discharge Instruction: Cover with ABD pad Kerlix 4.5 x 4.1 (in/yd) Discharge Instruction: Apply Kerlix 4.5 x 4.1 (in/yd) as instructed Secured With [employer] [place] H Soft Cloth Surgical Tape, 2x2 (in/yd) Compression Wrap Compression Stockings Add-Ons Electronic Signature(s) Signed: 11/08/2021 4:38:54 PM By: Levora Dredge Entered By: Levora Dredge on 11/08/2021 11:53:10 Hodzic, Kimble H. ([id number]) -------------------------------------------------------------------------------- Vitals Details Patient Name: Canipe, Eilyn H. Date of Service: 11/08/2021 11:30 AM Medical Record Number: [id number] Patient Account Number: [id number] Date of Birth/Sex: Nov 25, 1950 (71 y.o. F) Treating RN: Levora Dredge Primary Care Provider: Raelene Bott Other Clinician: Referring Provider: Raelene Bott Treating Provider/Extender: Skipper Cliche in Treatment: 11 Vital Signs Time Taken: 11:34 Temperature (F): 97.6 Height (in): 64 Pulse (bpm): 99 Weight (lbs): 142 Respiratory Rate (breaths/min): 18 Body Mass Index (BMI): 24.4 Blood Pressure (mmHg): 135/71 Reference Range: 80 - 120 mg / dl Electronic Signature(s) Signed: 11/08/2021 4:38:54 PM By: Levora Dredge Entered By: Levora Dredge on 11/08/2021 11:37:31

## 2021-11-24 ENCOUNTER — Encounter: Payer: Medicare Other | Attending: Physician Assistant | Admitting: Physician Assistant

## 2021-11-24 DIAGNOSIS — I2510 Atherosclerotic heart disease of native coronary artery without angina pectoris: Secondary | ICD-10-CM | POA: Diagnosis not present

## 2021-11-24 DIAGNOSIS — E1122 Type 2 diabetes mellitus with diabetic chronic kidney disease: Secondary | ICD-10-CM | POA: Insufficient documentation

## 2021-11-24 DIAGNOSIS — I5042 Chronic combined systolic (congestive) and diastolic (congestive) heart failure: Secondary | ICD-10-CM | POA: Insufficient documentation

## 2021-11-24 DIAGNOSIS — L89620 Pressure ulcer of left heel, unstageable: Secondary | ICD-10-CM | POA: Diagnosis not present

## 2021-11-24 DIAGNOSIS — E1151 Type 2 diabetes mellitus with diabetic peripheral angiopathy without gangrene: Secondary | ICD-10-CM | POA: Diagnosis not present

## 2021-11-24 DIAGNOSIS — E1140 Type 2 diabetes mellitus with diabetic neuropathy, unspecified: Secondary | ICD-10-CM | POA: Diagnosis not present

## 2021-11-24 DIAGNOSIS — E11621 Type 2 diabetes mellitus with foot ulcer: Secondary | ICD-10-CM | POA: Diagnosis not present

## 2021-11-24 DIAGNOSIS — N1831 Chronic kidney disease, stage 3a: Secondary | ICD-10-CM | POA: Insufficient documentation

## 2021-11-24 DIAGNOSIS — L89890 Pressure ulcer of other site, unstageable: Secondary | ICD-10-CM | POA: Diagnosis not present

## 2021-11-24 DIAGNOSIS — I130 Hypertensive heart and chronic kidney disease with heart failure and stage 1 through stage 4 chronic kidney disease, or unspecified chronic kidney disease: Secondary | ICD-10-CM | POA: Diagnosis not present

## 2021-11-24 DIAGNOSIS — L89610 Pressure ulcer of right heel, unstageable: Secondary | ICD-10-CM | POA: Insufficient documentation

## 2021-11-24 DIAGNOSIS — L89510 Pressure ulcer of right ankle, unstageable: Secondary | ICD-10-CM | POA: Insufficient documentation

## 2021-11-24 NOTE — Progress Notes (Addendum)
MAXIMINA, PIROZZI ([id number]) Visit Report for 11/24/2021 Arrival Information Details Patient Name: Patricia [name], Patricia [name]. Date of Service: 11/24/2021 11:30 AM Medical Record Number: [id number] Patient Account Number: [id number] Date of Birth/Sex: 1951-01-15 (71 y.o. F) Treating RN: Levora Dredge Primary Care Florabel Faulks: Raelene Bott Other Clinician: Referring Dilan Fullenwider: Raelene Bott Treating Donesha Wallander/Extender: Skipper Cliche in Treatment: 14 Visit Information History Since Last Visit Added or deleted any medications: No Patient Arrived: Wheel Chair Any new allergies or adverse reactions: No Arrival Time: 12:07 Had a fall or experienced change in No Accompanied By: self activities of daily living that may affect Transfer Assistance: None risk of falls: Patient Identification Verified: Yes Hospitalized since last visit: Yes Secondary Verification Process Completed: Yes Has Dressing in Place as Prescribed: Yes Patient Requires Transmission-Based No Pain Present Now: No Precautions: Patient Has Alerts: Yes Patient Alerts: Patient on Blood Thinner type 2 diabetic [place] Signature(s) Signed: 11/24/2021 4:47:59 PM By: Levora Dredge Entered By: Levora Dredge on 11/24/2021 12:08:09 Duer, Meagen Lemmie [name] ([id number]) -------------------------------------------------------------------------------- Clinic Level of Care Assessment Details Patient Name: Patricia [name], [name] Date of Service: 11/24/2021 11:30 AM Medical Record Number: [id number] Patient Account Number: [id number] Date of Birth/Sex: [birth year] (71 y.o. F) Treating RN: Levora Dredge Primary Care [place]: Raelene Bott Other Clinician: Referring Andray Assefa: Raelene Bott Treating Tala Patricia [name]/Extender: Skipper Cliche in Treatment: 14 Clinic Level of Care Assessment Items TOOL 1 Quantity Score []  - Use when EandM and Procedure is performed on INITIAL visit 0 ASSESSMENTS - Nursing Assessment /  Reassessment []  - General Physical Exam (combine w/ comprehensive assessment (listed just below) when performed on new 0 pt. evals) []  - 0 Comprehensive Assessment (HX, ROS, Risk Assessments, Wounds Hx, etc.) ASSESSMENTS - Wound and Skin Assessment / Reassessment []  - Dermatologic / Skin Assessment (not related to wound area) 0 ASSESSMENTS - Ostomy and/or Continence Assessment and Care []  - Incontinence Assessment and Management 0 []  - 0 Ostomy Care Assessment and Management (repouching, etc.) PROCESS - Coordination of Care []  - Simple Patient / Family Education for ongoing care 0 []  - 0 Complex (extensive) Patient / Family Education for ongoing care []  - 0 Staff obtains Programmer, systems, Records, Test Results / Process Orders []  - 0 Staff telephones HHA, Nursing Homes / Clarify orders / etc []  - 0 Routine Transfer to another Facility (non-emergent condition) []  - 0 Routine Hospital Admission (non-emergent condition) []  - 0 New Admissions / Biomedical engineer / Ordering NPWT, Apligraf, etc. []  - 0 Emergency Hospital Admission (emergent condition) PROCESS - Special Needs []  - Pediatric / Minor Patient Management 0 []  - 0 Isolation Patient Management []  - 0 Hearing / Language / Visual special needs []  - 0 Assessment of Community assistance (transportation, D/C planning, etc.) []  - 0 Additional assistance / Altered mentation []  - 0 Support Surface(s) Assessment (bed, cushion, seat, etc.) INTERVENTIONS - Miscellaneous []  - External ear exam 0 []  - 0 Patient Transfer (multiple staff / Civil Service fast streamer / Similar devices) []  - 0 Simple Staple / Suture removal (25 or less) []  - 0 Complex Staple / Suture removal (26 or more) []  - 0 Hypo/Hyperglycemic Management (do not check if billed separately) []  - 0 Ankle / Brachial Index (ABI) - do not check if billed separately Has the patient been seen at the hospital within the last three years: Yes Total Score: 0 Level Of Care:  ____ Patricia [name] ([id number]) Electronic Signature(s) Signed: 11/24/2021 4:47:59 PM By: Levora Dredge Entered By: Levora Dredge on 11/24/2021 13:30:05  AKYLA, VAVREK ([id number]) -------------------------------------------------------------------------------- Encounter Discharge Information Details Patient Name: Patricia [name], [name] Date of Service: 11/24/2021 11:30 AM Medical Record Number: [id number] Patient Account Number: [id number] Date of Birth/Sex: [birth year] (71 y.o. F) Treating RN: Angelina Pih Primary Care Hopelynn Gartland: Lindwood Qua Other Clinician: Referring Damia Bobrowski: Lindwood Qua Treating [place] Pirie/Extender: Rowan Blase in Treatment: 14 Encounter Discharge Information Items Post Procedure Vitals Discharge Condition: Stable Temperature (F): 98.2 Ambulatory Status: Wheelchair Pulse (bpm): 82 Discharge Destination: Skilled Nursing Facility Respiratory Rate (breaths/min): 18 Telephoned: No Blood Pressure (mmHg): 123/66 Orders Sent: Yes Transportation: Other Accompanied By: staff Schedule Follow-up Appointment: Yes Clinical Summary of Care: Electronic Signature(s) Signed: 11/24/2021 1:31:36 PM By: Angelina Pih Entered By: Angelina Pih on 11/24/2021 13:31:36 Zbikowski, [name] ([id number]) -------------------------------------------------------------------------------- Lower Extremity Assessment Details Patient Name: Patricia [name], [name] Date of Service: 11/24/2021 11:30 AM Medical Record Number: [id number] Patient Account Number: [id number] Date of Birth/Sex: [birth year] (71 y.o. F) Treating RN: Angelina Pih Primary Care Gaius Ishaq: Lindwood Qua Other Clinician: Referring Betzabeth Derringer: Lindwood Qua Treating Renae Mottley/Extender: Rowan Blase in Treatment: 14 Edema Assessment Assessed: [Left: No] [Right: No] Edema: [Left: Yes] [Right: Yes] Calf Left: Right: Point of Measurement: 30 cm From Medial Instep 37 cm 37.2 cm Ankle Left: Right: Point of  Measurement: 9 cm From Medial Instep 22.1 cm 22 cm Electronic Signature(s) Signed: 11/24/2021 4:47:59 PM By: Angelina Pih Entered By: Angelina Pih on 11/24/2021 12:11:12 Patricia [name], [name] ([id number]) -------------------------------------------------------------------------------- Multi Wound Chart Details Patient Name: Patricia [name], [name] Date of Service: 11/24/2021 11:30 AM Medical Record Number: [id number] Patient Account Number: [id number] Date of Birth/Sex: 1951-03-21 (71 y.o. F) Treating RN: Angelina Pih Primary Care Tramar Brueckner: Lindwood Qua Other Clinician: Referring Maison Kestenbaum: Lindwood Qua Treating Jude Naclerio/Extender: Rowan Blase in Treatment: 14 Vital Signs Height(in): 64 Pulse(bpm): 82 Weight(lbs): 142 Blood Pressure(mmHg): 123/66 Body Mass Index(BMI): 24.4 Temperature(F): 98.2 Respiratory Rate(breaths/min): 18 Photos: Wound Location: Left Calcaneus Right Calcaneus Right, Lateral Ankle Wounding Event: Pressure Injury Pressure Injury Pressure Injury Primary Etiology: Pressure Ulcer Pressure Ulcer Pressure Ulcer Secondary Etiology: N/A Diabetic Wound/Ulcer of the Lower Diabetic Wound/Ulcer of the Lower Extremity Extremity Comorbid History: Chronic sinus problems/congestion, Chronic sinus problems/congestion, Chronic sinus problems/congestion, Anemia, Chronic Obstructive Anemia, Chronic Obstructive Anemia, Chronic Obstructive Pulmonary Disease (COPD), Pulmonary Disease (COPD), Pulmonary Disease (COPD), Congestive Heart Failure, Congestive Heart Failure, Congestive Heart Failure, Hypertension, Peripheral Venous Hypertension, Peripheral Venous Hypertension, Peripheral Venous Disease, Type II Diabetes, Disease, Type II Diabetes, Disease, Type II Diabetes, Rheumatoid Arthritis, Neuropathy Rheumatoid Arthritis, Neuropathy Rheumatoid Arthritis, Neuropathy Date Acquired: 05/22/2021 05/22/2021 07/21/2018 Weeks of Treatment: 14 14 14  Wound Status: Open Open Open Wound  Recurrence: No No No Measurements L x W x D (cm) 2.2x2x0.1 1.4x1.3x0.1 0.5x0.5x0.1 Area (cm) : 3.456 1.429 0.196 Volume (cm) : 0.346 0.143 0.02 % Reduction in Area: 26.70% 49.50% 69.20% % Reduction in Volume: 26.50% 49.50% 68.80% Classification: Unstageable/Unclassified Unstageable/Unclassified Unstageable/Unclassified Exudate Amount: Medium Medium Medium Exudate Type: Serosanguineous Serosanguineous Serosanguineous Exudate Color: red, brown red, brown red, brown Granulation Amount: None Present (0%) None Present (0%) None Present (0%) Necrotic Amount: Large (67-100%) Large (67-100%) Large (67-100%) Necrotic Tissue: Eschar Eschar Adherent Slough Exposed Structures: Fat Layer (Subcutaneous Tissue): Fat Layer (Subcutaneous Tissue): Fat Layer (Subcutaneous Tissue): No Yes Yes Fascia: No Fascia: No Tendon: No Tendon: No Muscle: No Muscle: No Joint: No Joint: No Bone: No Bone: No Epithelialization: None None None Wound Number: 4 N/A N/A Photos: N/A N/A Lepera, Obdulia H. ( ) Wound Location: Right, Lateral Foot N/A N/A Wounding Event: Pressure Injury N/A N/A Primary  Etiology: Pressure Ulcer N/A N/A Secondary Etiology: Diabetic Wound/Ulcer of the Lower N/A N/A Extremity Comorbid History: Chronic sinus problems/congestion, N/A N/A Anemia, Chronic Obstructive Pulmonary Disease (COPD), Congestive Heart Failure, Hypertension, Peripheral Venous Disease, Type II Diabetes, Rheumatoid Arthritis, Neuropathy Date Acquired: 07/21/2018 N/A N/A Weeks of Treatment: 14 N/A N/A Wound Status: Open N/A N/A Wound Recurrence: No N/A N/A Measurements L x W x D (cm) 3x3.2x0.1 N/A N/A Area (cm) : 7.54 N/A N/A Volume (cm) : 0.754 N/A N/A % Reduction in Area: -11.10% N/A N/A % Reduction in Volume: -11.00% N/A N/A Classification: Unstageable/Unclassified N/A N/A Exudate Amount: Medium N/A N/A Exudate Type: Serosanguineous N/A N/A Exudate Color: red, brown N/A N/A Granulation Amount:  None Present (0%) N/A N/A Necrotic Amount: Large (67-100%) N/A N/A Necrotic Tissue: Eschar N/A N/A Exposed Structures: Fat Layer (Subcutaneous Tissue): N/A N/A Yes Fascia: No Tendon: No Muscle: No Joint: No Bone: No Epithelialization: None N/A N/A Treatment Notes Electronic Signature(s) Signed: 11/24/2021 4:47:59 PM By: Levora Dredge Entered By: Levora Dredge on 11/24/2021 12:11:30 Foot, Hena Lemmie [name] ([id number]) -------------------------------------------------------------------------------- Multi-Disciplinary Care Plan Details Patient Name: Patricia [name], [name] Date of Service: 11/24/2021 11:30 AM Medical Record Number: [id number] Patient Account Number: [id number] Date of Birth/Sex: 10/14/1950 (71 y.o. F) Treating RN: Levora Dredge Primary Care Journii Nierman: Raelene Bott Other Clinician: Referring Elizeth Weinrich: Raelene Bott Treating Benedetta Sundstrom/Extender: Skipper Cliche in Treatment: 14 Active Inactive Pressure Nursing Diagnoses: Knowledge deficit related to causes and risk factors for pressure ulcer development Knowledge deficit related to management of pressures ulcers Potential for impaired tissue integrity related to pressure, friction, moisture, and shear Goals: Patient will remain free from development of additional pressure ulcers Date Initiated: 08/18/2021 Date Inactivated: 10/18/2021 Target Resolution Date: 08/20/2021 Goal Status: Met Patient will remain free of pressure ulcers Date Initiated: 08/18/2021 Date Inactivated: 11/08/2021 Target Resolution Date: 08/20/2021 Goal Status: Met Patient/caregiver will verbalize risk factors for pressure ulcer development Date Initiated: 08/18/2021 Target Resolution Date: 08/18/2021 Goal Status: Active Patient/caregiver will verbalize understanding of pressure ulcer management Date Initiated: 08/18/2021 Date Inactivated: 09/12/2021 Target Resolution Date: 08/18/2021 Goal Status: Met Interventions: Assess: immobility, friction,  shearing, incontinence upon admission and as needed Assess offloading mechanisms upon admission and as needed Assess potential for pressure ulcer upon admission and as needed Provide education on pressure ulcers Notes: Wound/Skin Impairment Nursing Diagnoses: Impaired tissue integrity Knowledge deficit related to ulceration/compromised skin integrity Goals: Ulcer/skin breakdown will have a volume reduction of 30% by week 4 Date Initiated: 08/18/2021 Target Resolution Date: 09/15/2021 Goal Status: Active Ulcer/skin breakdown will have a volume reduction of 50% by week 8 Date Initiated: 08/18/2021 Target Resolution Date: 10/13/2021 Goal Status: Active Ulcer/skin breakdown will have a volume reduction of 80% by week 12 Date Initiated: 08/18/2021 Target Resolution Date: 11/10/2021 Goal Status: Active Ulcer/skin breakdown will heal within 14 weeks Date Initiated: 08/18/2021 Target Resolution Date: 11/24/2021 Goal Status: Active Interventions: Assess patient/caregiver ability to obtain necessary supplies Assess patient/caregiver ability to perform ulcer/skin care regimen upon admission and as needed SCHARLENE, CATALINA ([id number]) Assess ulceration(s) every visit Provide education on ulcer and skin care Notes: Electronic Signature(s) Signed: 11/24/2021 4:47:59 PM By: Levora Dredge Entered By: Levora Dredge on 11/24/2021 12:11:22 Patricia [name], Patricia [name] ([id number]) -------------------------------------------------------------------------------- Pain Assessment Details Patient Name: Patricia [name], [name] Date of Service: 11/24/2021 11:30 AM Medical Record Number: [id number] Patient Account Number: [id number] Date of Birth/Sex: May 04, 1951 (71 y.o. F) Treating RN: Levora Dredge Primary Care Nadine Ryle: Raelene Bott Other Clinician: Referring Dawnyel Leven: Raelene Bott Treating Clemon Devaul/Extender: Skipper Cliche in Treatment:  14 Active Problems Location of Pain Severity and Description of  Pain Patient Has Paino No Site Locations Rate the pain. Current Pain Level: 0 Pain Management and Medication Current Pain Management: Electronic Signature(s) Signed: 11/24/2021 4:47:59 PM By: Levora Dredge Entered By: Levora Dredge on 11/24/2021 12:08:30 Patricia [name], Patricia [name] ([id number]) -------------------------------------------------------------------------------- Patient/Caregiver Education Details Patient Name: Patricia [name], Khelani H. Date of Service: 11/24/2021 11:30 AM Medical Record Number: [id number] Patient Account Number: [id number] Date of Birth/Gender: 1950/11/10 (71 y.o. F) Treating RN: Levora Dredge Primary Care Physician: Raelene Bott Other Clinician: Referring Physician: Raelene Bott Treating Physician/Extender: Skipper Cliche in Treatment: 14 Education Assessment Education Provided To: Patient Education Topics Provided Pressure: Handouts: Pressure Ulcers: Care and Offloading Methods: Explain/Verbal Responses: State content correctly Wound Debridement: Handouts: Wound Debridement Methods: Explain/Verbal Responses: State content correctly Wound/Skin Impairment: Handouts: Caring for Your Ulcer Methods: Explain/Verbal Responses: State content correctly Electronic Signature(s) Signed: 11/24/2021 4:47:59 PM By: Levora Dredge Entered By: Levora Dredge on 11/24/2021 13:30:34 Patricia [name], Cadyn H. ([id number]) -------------------------------------------------------------------------------- Wound Assessment Details Patient Name: Regnier, Orly H. Date of Service: 11/24/2021 11:30 AM Medical Record Number: [id number] Patient Account Number: [id number] Date of Birth/Sex: [birth year] (71 y.o. F) Treating RN: Levora Dredge Primary Care Suraya Vidrine: Raelene Bott Other Clinician: Referring Marquet Faircloth: Raelene Bott Treating Daven Pinckney/Extender: Skipper Cliche in Treatment: 14 Wound Status Wound Number: 1 Primary Pressure Ulcer Etiology: Wound Location: Left  Calcaneus Wound Open Wounding Event: Pressure Injury Status: Date Acquired: 05/22/2021 Comorbid Chronic sinus problems/congestion, Anemia, Chronic Weeks Of Treatment: 14 History: Obstructive Pulmonary Disease (COPD), Congestive Heart Clustered Wound: No Failure, Hypertension, Peripheral Venous Disease, Type II Diabetes, Rheumatoid Arthritis, Neuropathy Photos Wound Measurements Length: (cm) 2.2 Width: (cm) 2 Depth: (cm) 0.1 Area: (cm) 3.456 Volume: (cm) 0.346 % Reduction in Area: 26.7% % Reduction in Volume: 26.5% Epithelialization: None Tunneling: No Undermining: No Wound Description Classification: Unstageable/Unclassified Exudate Amount: Medium Exudate Type: Serosanguineous Exudate Color: red, brown Foul Odor After Cleansing: No Slough/Fibrino Yes Wound Bed Granulation Amount: None Present (0%) Exposed Structure Necrotic Amount: Large (67-100%) Fat Layer (Subcutaneous Tissue) Exposed: No Necrotic Quality: Eschar Treatment Notes Wound #1 (Calcaneus) Wound Laterality: Left Cleanser Normal Saline Discharge Instruction: Wash your hands with soap and water. Remove old dressing, discard into plastic bag and place into trash. Cleanse the wound with Normal Saline prior to applying a clean dressing using gauze sponges, not tissues or cotton balls. Do not scrub or use excessive force. Pat dry using gauze sponges, not tissue or cotton balls. Soap and Water Discharge Instruction: Gently cleanse wound with antibacterial soap, rinse and pat dry prior to dressing wounds Dosch, Nica H. ([id number]) Peri-Wound Care Topical Santyl Collagenase Ointment, 30 (gm), tube Discharge Instruction: apply nickel thick to wound bed only Primary Dressing Gauze Discharge Instruction: As directed: moistened with saline Secondary Dressing ABD Pad 5x9 (in/in) Discharge Instruction: Cover with ABD pad Kerlix 4.5 x 4.1 (in/yd) Discharge Instruction: Apply Kerlix 4.5 x 4.1 (in/yd) as  instructed Secured With [place] H Soft Cloth Surgical Tape, 2x2 (in/yd) Compression Wrap Compression Stockings Add-Ons Electronic Signature(s) Signed: 11/24/2021 4:47:59 PM By: Levora Dredge Entered By: Levora Dredge on 11/24/2021 12:09:08 Yero, Rollande Lemmie [name] ([id number]) -------------------------------------------------------------------------------- Wound Assessment Details Patient Name: Fetherolf, Miyonna H. Date of Service: 11/24/2021 11:30 AM Medical Record Number: [id number] Patient Account Number: [id number] Date of Birth/Sex: Jul 17, 1950 (71 y.o. F) Treating RN: Levora Dredge Primary Care Susannah Carbin: Raelene Bott Other Clinician: Referring Shawntia Mangal: Raelene Bott Treating Catha Ontko/Extender: Skipper Cliche in Treatment: 14 Wound Status Wound Number: 2  Primary Pressure Ulcer Etiology: Wound Location: Right Calcaneus Secondary Diabetic Wound/Ulcer of the Lower Extremity Wounding Event: Pressure Injury Etiology: Date Acquired: 05/22/2021 Wound Open Weeks Of Treatment: 14 Status: Clustered Wound: No Comorbid Chronic sinus problems/congestion, Anemia, Chronic History: Obstructive Pulmonary Disease (COPD), Congestive Heart Failure, Hypertension, Peripheral Venous Disease, Type II Diabetes, Rheumatoid Arthritis, Neuropathy Photos Wound Measurements Length: (cm) 1.4 % Reduc Width: (cm) 1.3 % Reduc Depth: (cm) 0.1 Epithel Area: (cm) 1.429 Tunnel Volume: (cm) 0.143 Underm tion in Area: 49.5% tion in Volume: 49.5% ialization: None ing: No ining: No Wound Description Classification: Unstageable/Unclassified Foul O Exudate Amount: Medium Slough Exudate Type: Serosanguineous Exudate Color: red, brown dor After Cleansing: No /Fibrino Yes Wound Bed Granulation Amount: None Present (0%) Exposed Structure Necrotic Amount: Large (67-100%) Fascia Exposed: No Necrotic Quality: Eschar Fat Layer (Subcutaneous Tissue) Exposed: Yes Tendon Exposed:  No Muscle Exposed: No Joint Exposed: No Bone Exposed: No Treatment Notes Wound #2 (Calcaneus) Wound Laterality: Right Cleanser Normal Saline Earnest, Leidy H. ([id number]) Discharge Instruction: Wash your hands with soap and water. Remove old dressing, discard into plastic bag and place into trash. Cleanse the wound with Normal Saline prior to applying a clean dressing using gauze sponges, not tissues or cotton balls. Do not scrub or use excessive force. Pat dry using gauze sponges, not tissue or cotton balls. Soap and Water Discharge Instruction: Gently cleanse wound with antibacterial soap, rinse and pat dry prior to dressing wounds Peri-Wound Care Topical Santyl Collagenase Ointment, 30 (gm), tube Discharge Instruction: apply nickel thick to wound bed only Primary Dressing Gauze Discharge Instruction: As directed: moistened with saline Secondary Dressing ABD Pad 5x9 (in/in) Discharge Instruction: Cover with ABD pad Kerlix 4.5 x 4.1 (in/yd) Discharge Instruction: Apply Kerlix 4.5 x 4.1 (in/yd) as instructed Secured With [place] H Soft Cloth Surgical Tape, 2x2 (in/yd) Compression Wrap Compression Stockings Add-Ons Electronic Signature(s) Signed: 11/24/2021 4:47:59 PM By: Levora Dredge Entered By: Levora Dredge on 11/24/2021 12:09:34 Ramakrishnan, Lalonnie H. ([id number]) -------------------------------------------------------------------------------- Wound Assessment Details Patient Name: Meland, Tristen H. Date of Service: 11/24/2021 11:30 AM Medical Record Number: [id number] Patient Account Number: [id number] Date of Birth/Sex: 1950/11/02 (71 y.o. F) Treating RN: Levora Dredge Primary Care Kierre Deines: Raelene Bott Other Clinician: Referring Avon Mergenthaler: Raelene Bott Treating Brigit Doke/Extender: Skipper Cliche in Treatment: 14 Wound Status Wound Number: 3 Primary Pressure Ulcer Etiology: Wound Location: Right, Lateral Ankle Secondary Diabetic  Wound/Ulcer of the Lower Extremity Wounding Event: Pressure Injury Etiology: Date Acquired: 07/21/2018 Wound Open Weeks Of Treatment: 14 Status: Clustered Wound: No Comorbid Chronic sinus problems/congestion, Anemia, Chronic History: Obstructive Pulmonary Disease (COPD), Congestive Heart Failure, Hypertension, Peripheral Venous Disease, Type II Diabetes, Rheumatoid Arthritis, Neuropathy Photos Wound Measurements Length: (cm) 0.5 % Reduc Width: (cm) 0.5 % Reduc Depth: (cm) 0.1 Epithel Area: (cm) 0.196 Tunnel Volume: (cm) 0.02 Underm tion in Area: 69.2% tion in Volume: 68.8% ialization: None ing: No ining: No Wound Description Classification: Unstageable/Unclassified Foul O Exudate Amount: Medium Slough Exudate Type: Serosanguineous Exudate Color: red, brown dor After Cleansing: No /Fibrino Yes Wound Bed Granulation Amount: None Present (0%) Exposed Structure Necrotic Amount: Large (67-100%) Fascia Exposed: No Necrotic Quality: Adherent Slough Fat Layer (Subcutaneous Tissue) Exposed: Yes Tendon Exposed: No Muscle Exposed: No Joint Exposed: No Bone Exposed: No Treatment Notes Wound #3 (Ankle) Wound Laterality: Right, Lateral Cleanser Normal Saline Gastineau, Elianah H. ([id number]) Discharge Instruction: Wash your hands with soap and water. Remove old dressing, discard into plastic bag and place into trash. Cleanse the wound with Normal  Saline prior to applying a clean dressing using gauze sponges, not tissues or cotton balls. Do not scrub or use excessive force. Pat dry using gauze sponges, not tissue or cotton balls. Soap and Water Discharge Instruction: Gently cleanse wound with antibacterial soap, rinse and pat dry prior to dressing wounds Peri-Wound Care Topical Santyl Collagenase Ointment, 30 (gm), tube Discharge Instruction: apply nickel thick to wound bed only Primary Dressing Gauze Discharge Instruction: As directed: moistened with saline Secondary  Dressing ABD Pad 5x9 (in/in) Discharge Instruction: Cover with ABD pad Kerlix 4.5 x 4.1 (in/yd) Discharge Instruction: Apply Kerlix 4.5 x 4.1 (in/yd) as instructed Secured With [place] H Soft Cloth Surgical Tape, 2x2 (in/yd) Compression Wrap Compression Stockings Add-Ons Electronic Signature(s) Signed: 11/24/2021 4:47:59 PM By: Levora Dredge Entered By: Levora Dredge on 11/24/2021 12:10:14 Sainato, Jonnell H. ([id number]) -------------------------------------------------------------------------------- Wound Assessment Details Patient Name: Bagshaw, Alaija H. Date of Service: 11/24/2021 11:30 AM Medical Record Number: [id number] Patient Account Number: [id number] Date of Birth/Sex: August 06, 1950 (71 y.o. F) Treating RN: Levora Dredge Primary Care Gaelle Adriance: Raelene Bott Other Clinician: Referring Vanisha Whiten: Raelene Bott Treating Algie Cales/Extender: Skipper Cliche in Treatment: 14 Wound Status Wound Number: 4 Primary Pressure Ulcer Etiology: Wound Location: Right, Lateral Foot Secondary Diabetic Wound/Ulcer of the Lower Extremity Wounding Event: Pressure Injury Etiology: Date Acquired: 07/21/2018 Wound Open Weeks Of Treatment: 14 Status: Clustered Wound: No Comorbid Chronic sinus problems/congestion, Anemia, Chronic History: Obstructive Pulmonary Disease (COPD), Congestive Heart Failure, Hypertension, Peripheral Venous Disease, Type II Diabetes, Rheumatoid Arthritis, Neuropathy Photos Wound Measurements Length: (cm) 3 % Reduc Width: (cm) 3.2 % Reduc Depth: (cm) 0.1 Epithel Area: (cm) 7.54 Tunnel Volume: (cm) 0.754 Underm tion in Area: -11.1% tion in Volume: -11% ialization: None ing: No ining: No Wound Description Classification: Unstageable/Unclassified Foul O Exudate Amount: Medium Slough Exudate Type: Serosanguineous Exudate Color: red, brown dor After Cleansing: No /Fibrino Yes Wound Bed Granulation Amount: None Present (0%) Exposed  Structure Necrotic Amount: Large (67-100%) Fascia Exposed: No Necrotic Quality: Eschar Fat Layer (Subcutaneous Tissue) Exposed: Yes Tendon Exposed: No Muscle Exposed: No Joint Exposed: No Bone Exposed: No Treatment Notes Wound #4 (Foot) Wound Laterality: Right, Lateral Cleanser Normal Saline Eide, Amiree H. ([id number]) Discharge Instruction: Wash your hands with soap and water. Remove old dressing, discard into plastic bag and place into trash. Cleanse the wound with Normal Saline prior to applying a clean dressing using gauze sponges, not tissues or cotton balls. Do not scrub or use excessive force. Pat dry using gauze sponges, not tissue or cotton balls. Soap and Water Discharge Instruction: Gently cleanse wound with antibacterial soap, rinse and pat dry prior to dressing wounds Peri-Wound Care Topical Santyl Collagenase Ointment, 30 (gm), tube Discharge Instruction: apply nickel thick to wound bed only Primary Dressing Gauze Discharge Instruction: As directed: moistened with saline Secondary Dressing ABD Pad 5x9 (in/in) Discharge Instruction: Cover with ABD pad Kerlix 4.5 x 4.1 (in/yd) Discharge Instruction: Apply Kerlix 4.5 x 4.1 (in/yd) as instructed Secured With [place] H Soft Cloth Surgical Tape, 2x2 (in/yd) Compression Wrap Compression Stockings Add-Ons Electronic Signature(s) Signed: 11/24/2021 4:47:59 PM By: Levora Dredge Entered By: Levora Dredge on 11/24/2021 12:10:41 [place], [place]. ([id number]) -------------------------------------------------------------------------------- Vitals Details Patient Name: Pheasant, Stehanie H. Date of Service: 11/24/2021 11:30 AM Medical Record Number: [id number] Patient Account Number: [id number] Date of Birth/Sex: [birth year] (71 y.o. F) Treating RN: Levora Dredge Primary Care Trine Fread: Raelene Bott Other Clinician: Referring Gwendolin Briel: Raelene Bott Treating Thanvi Blincoe/Extender: Skipper Cliche in  Treatment:  14 Vital Signs Time Taken: 12:00 Temperature (F): 98.2 Height (in): 64 Pulse (bpm): 82 Weight (lbs): 142 Respiratory Rate (breaths/min): 18 Body Mass Index (BMI): 24.4 Blood Pressure (mmHg): 123/66 Reference Range: 80 - 120 mg / dl Airway Pulse Oximetry (%): 92 Notes patient states recent trip to hospital for respiratory issues. Patient states somewhat better now, PA Stone made aware. Advised patient if continues to get worse she needs to go back to hospital and should be getting daily weights Electronic Signature(s) Signed: 11/24/2021 4:47:59 PM By: Levora Dredge Entered By: Levora Dredge on 11/24/2021 12:19:33

## 2021-11-24 NOTE — Progress Notes (Addendum)
AVALYNN, RASBURY ([id number]) Visit Report for 11/24/2021 Chief Complaint Document Details Patient Name: Patricia [name], Patricia [name]. Date of Service: 11/24/2021 11:30 AM Medical Record Number: [id number] Patient Account Number: [id number] Date of Birth/Sex: 05-03-1951 (71 y.o. F) Treating RN: Levora Dredge Primary Care Provider: Raelene Bott Other Clinician: Referring Provider: Raelene Bott Treating Provider/Extender: Skipper Cliche in Treatment: 14 Information Obtained from: Patient Chief Complaint Bilateral Foot Ulcers Electronic Signature(s) Signed: 11/24/2021 11:41:53 AM By: Worthy Keeler PA-C Entered By: Worthy Keeler on 11/24/2021 11:41:52 [place], [place] ([id number]) -------------------------------------------------------------------------------- Debridement Details Patient Name: Patricia [name], [name] Date of Service: 11/24/2021 11:30 AM Medical Record Number: [id number] Patient Account Number: [id number] Date of Birth/Sex: 09-06-1950 (71 y.o. F) Treating RN: Levora Dredge Primary Care Provider: Raelene Bott Other Clinician: Referring Provider: Raelene Bott Treating Provider/Extender: Skipper Cliche in Treatment: 14 Debridement Performed for Wound #4 Right,Lateral Foot Assessment: Performed By: Physician Tommie Sams., PA-C Debridement Type: Debridement Severity of Tissue Pre Debridement: Fat layer exposed Level of Consciousness (Pre- Awake and Alert procedure): Pre-procedure Verification/Time Out Yes - 12:15 Taken: Total Area Debrided (L x W): 2 (cm) x 2 (cm) = 4 (cm) Tissue and other material Non-Viable, Eschar debrided: Level: Non-Viable Tissue Debridement Description: Selective/Open Wound Instrument: Forceps, Scissors Bleeding: Minimum Hemostasis Achieved: Pressure Response to Treatment: Procedure was tolerated well Level of Consciousness (Post- Awake and Alert procedure): Post Debridement Measurements of Total Wound Length: (cm) 3 Stage:  Unstageable/Unclassified Width: (cm) 3.2 Depth: (cm) 0.1 Volume: (cm) 0.754 Character of Wound/Ulcer Post Debridement: Stable Severity of Tissue Post Debridement: Fat layer exposed Post Procedure Diagnosis Same as Pre-procedure Electronic Signature(s) Signed: 11/24/2021 4:47:59 PM By: Levora Dredge Signed: 11/24/2021 4:56:25 PM By: Worthy Keeler PA-C Entered By: Levora Dredge on 11/24/2021 12:18:24 Mcglinn, Jamayia H. ([id number]) -------------------------------------------------------------------------------- HPI Details Patient Name: [name], [name] Date of Service: 11/24/2021 11:30 AM Medical Record Number: [id number] Patient Account Number: [id number] Date of Birth/Sex: 09/25/1950 (71 y.o. F) Treating RN: Levora Dredge Primary Care Provider: Raelene Bott Other Clinician: Referring Provider: Raelene Bott Treating Provider/Extender: Skipper Cliche in Treatment: 14 History of Present Illness HPI Description: 08/18/2021 patient presents today for reevaluation here in our clinic concerning issues she has been having with her feet bilaterally. She has multiple pressure injuries noted at this point. She was previously seen at [facility] where she did have a work-up which included x-rays and identified osteomyelitis likely in the fifth metatarsal base which is consistent with where she does have a wound noted at this point as well. The patient does appear to have severe peripheral vascular disease on the right with an ABI of 0.36 in the office and ABI on the left of 0.82. Obviously this is concerning for me. Even when I saw her in [place] 1 year ago January 2022 last she had very poor arterial flow at that time. She tells me that she did go for revascularization I honestly cannot find that file anywhere to show where this was done. Again I am not saying it was not done I am just not able to find that record at this point. With that being said currently they have been using  the Vaseline gauze with iodine. Nonetheless I do believe as well that the patient unfortunately has significant peripheral vascular disease which has led to the current wounds that she has at this point. She does have a history of multiple pressure injuries of her feet bilaterally in the heel and lateral portion of her right foot  where there is noted to be likely osteomyelitis on x-ray. She had an elevated sed rate at 44 and a C-reactive protein of 26. Again this is often consistent with inflammation which could be related to osteomyelitis as well. With that being said the patient also has a history of diabetes mellitus type 2, chronic kidney disease stage IIIa, coronary artery disease, chronic venous insufficiency, and congestive heart failure. 09-12-2021 upon evaluation today patient appears to be doing well currently in regard to her wounds all things considered. Unfortunately she has not had pretty much anything that was recommended 3 weeks ago when she was here done with the facility. She was supposed to have a evaluation with vascular that has not been scheduled as far as I can tell. She was also supposed to have an MRI of the right foot also this has not been done as best I can tell. She was supposed to be having Betadine applied to the foot followed by dry gauze and a lightly applied roll gauze to secure in place. Again this has also not been done. Again I am not really certain what exactly is going on and why there is this delay. I discussed this with the patient today she is very frustrated as well. 5/8; 2-week follow-up. Patient had a skilled facility in [place]. She has wounds on the right lateral foot, right lateral heel and right lateral ankle. Also wound on the left heel tip. We discovered a new area on the left posterior mid calf. All of these covered in significant eschar. I did not debride these. The patient did have her MRI done in [place]. This was of the right foot only.  This showed multiple shallow skin ulcerations at the lateral midfoot posterior lateral heel and lateral ankle without evidence of active osteomyelitis. She had a nondisplaced fracture of the fifth metatarsal neck 10-18-2021 upon evaluation today patient appears to be doing a little better in regard to her wounds. Everything seems to be dry and stable which is great news. Fortunately I do not see any evidence of active infection locally or systemically at this time which is great news. No fevers, chills, nausea, vomiting, or diarrhea. With that being said there is some question here as to whether or not the patient actually had her vascular appointment I cannot see anything active in care everywhere. With that being said listening to her talk today it appears that she may have seen a vascular surgeon that may have actually talked about the fact that they did not feel anything could be done without damaging her kidneys to improve the blood flow in her legs. With that being said she does not want to have an amputation which I completely understand and agree with. Especially if she is staying dry and stable. With that being said as I explained to her today especially if we cannot improve blood flow and have no option there I am not certain if this could have anything we can do to know whether or not she will end up with an amputation which is good have to keep doing what you are doing and trying to get this healed as best we can. 11-08-2021 upon evaluation today patient appears to be doing well currently in regard to her wounds that they are starting to soften up to some degree although she still having a lot of discomfort unfortunately. Fortunately I do not see any evidence of active infection locally or systemically which is great news. No fevers,  chills, nausea, vomiting, or diarrhea. 11-24-2021 upon evaluation today patient appears to be doing well currently in regard to her wounds where a lot of the  necrotic tissue is softening up this is good news. Fortunately there does not appear to be any evidence of active infection locally or systemically at this time which is also excellent news. No fevers, chills, nausea, vomiting, or diarrhea. Unfortunately the patient has been in the hospital and during this time in the hospital she was treated for a congestive heart failure exacerbation with shortness of breath. She tells me that she is feeling a little bit better though she still seems to be a little bit on the lower side as far as her breathing is concerned. Her oxygen saturation is 92% today but she is a little bit more labored than what I am used to seeing out of her. Electronic Signature(s) Signed: 11/24/2021 1:19:41 PM By: Lenda Kelp PA-C Entered By: Lenda Kelp on 11/24/2021 13:19:41 Hatler, Willona Rexene Edison ([id number]) -------------------------------------------------------------------------------- Physical Exam Details Patient Name: Eddleman, Janeann H. Date of Service: 11/24/2021 11:30 AM Medical Record Number: [id number] Patient Account Number: [id number] Date of Birth/Sex: August 22, 1950 (71 y.o. F) Treating RN: Angelina Pih Primary Care Provider: Lindwood Qua Other Clinician: Referring Provider: Lindwood Qua Treating Provider/Extender: Rowan Blase in Treatment: 14 Constitutional Obese and well-hydrated in no acute distress. Respiratory normal breathing without difficulty. Psychiatric this patient is able to make decisions and demonstrates good insight into disease process. Alert and Oriented x 3. pleasant and cooperative. Notes Upon inspection patient's wound bed actually showed signs of good granulation and epithelization at this point. Fortunately there does not appear to be any evidence of active infection at this time which is great news. No fevers, chills, nausea, vomiting, or diarrhea. She does have some eschar noted with the need to try to clean away some of this  on the right lateral foot. She tolerated this with really no significant discomfort I was able to remove a portion of the eschar not the hole where it was loosening up. Again we will try to be very careful due to her limitations in blood flow especially on this right side is much as possible. We are using Santyl in general to soften everything up the wound to get soft treatment away what we can. Electronic Signature(s) Signed: 11/24/2021 4:56:25 PM By: Lenda Kelp PA-C Entered By: Lenda Kelp on 11/24/2021 13:20:47 Mccarroll, Edyth Rexene Edison ([id number]) -------------------------------------------------------------------------------- Physician Orders Details Patient Name: [name], Charisse H. Date of Service: 11/24/2021 11:30 AM Medical Record Number: [id number] Patient Account Number: [id number] Date of Birth/Sex: 1951-03-27 (71 y.o. F) Treating RN: Angelina Pih Primary Care Provider: Lindwood Qua Other Clinician: Referring Provider: Lindwood Qua Treating Provider/Extender: Rowan Blase in Treatment: 14 Verbal / Phone Orders: No Diagnosis Coding ICD-10 Coding Code Description L89.620 Pressure ulcer of left heel, unstageable L89.610 Pressure ulcer of right heel, unstageable L89.510 Pressure ulcer of right ankle, unstageable L89.890 Pressure ulcer of other site, unstageable I73.89 Other specified peripheral vascular diseases E11.621 Type 2 diabetes mellitus with foot ulcer N18.31 Chronic kidney disease, stage 3a I25.10 Atherosclerotic heart disease of native coronary artery without angina pectoris I87.2 Venous insufficiency (chronic) (peripheral) I50.42 Chronic combined systolic (congestive) and diastolic (congestive) heart failure Follow-up Appointments o Return Appointment in 3 weeks. o Nurse Visit as needed Bathing/ Shower/ Hygiene o May shower; gently cleanse wound with antibacterial soap, rinse and pat dry prior to dressing wounds o No tub bath. Anesthetic (Use  'Patient Medications'  Section for Anesthetic Order Entry) o Lidocaine applied to wound bed Edema Control - Lymphedema / Segmental Compressive Device / Other o DO YOUR BEST to sleep in the bed at night. DO NOT sleep in your recliner. Long hours of sitting in a recliner leads to swelling of the legs and/or potential wounds on your backside. Off-Loading o Turn and reposition every 2 hours - please keep heels off bed when laying to relieve pressure Wound Treatment Wound #1 - Calcaneus Wound Laterality: Left Cleanser: Normal Saline 1 x Per Day/30 Days Discharge Instructions: Wash your hands with soap and water. Remove old dressing, discard into plastic bag and place into trash. Cleanse the wound with Normal Saline prior to applying a clean dressing using gauze sponges, not tissues or cotton balls. Do not scrub or use excessive force. Pat dry using gauze sponges, not tissue or cotton balls. Cleanser: Soap and Water 1 x Per Day/30 Days Discharge Instructions: Gently cleanse wound with antibacterial soap, rinse and pat dry prior to dressing wounds Topical: Santyl Collagenase Ointment, 30 (gm), tube 1 x Per Day/30 Days Discharge Instructions: apply nickel thick to wound bed only Primary Dressing: Gauze 1 x Per Day/30 Days Discharge Instructions: As directed: moistened with saline Secondary Dressing: ABD Pad 5x9 (in/in) 1 x Per Day/30 Days Discharge Instructions: Cover with ABD pad Secondary Dressing: Kerlix 4.5 x 4.1 (in/yd) 1 x Per Day/30 Days Discharge Instructions: Apply Kerlix 4.5 x 4.1 (in/yd) as instructed Tourigny, Hampton H. ([id number]) Secured With: Medipore Tape - 3104M Medipore H Soft Cloth Surgical Tape, 2x2 (in/yd) 1 x Per Day/30 Days Wound #2 - Calcaneus Wound Laterality: Right Cleanser: Normal Saline 1 x Per Day/30 Days Discharge Instructions: Wash your hands with soap and water. Remove old dressing, discard into plastic bag and place into trash. Cleanse the wound with Normal  Saline prior to applying a clean dressing using gauze sponges, not tissues or cotton balls. Do not scrub or use excessive force. Pat dry using gauze sponges, not tissue or cotton balls. Cleanser: Soap and Water 1 x Per Day/30 Days Discharge Instructions: Gently cleanse wound with antibacterial soap, rinse and pat dry prior to dressing wounds Topical: Santyl Collagenase Ointment, 30 (gm), tube 1 x Per Day/30 Days Discharge Instructions: apply nickel thick to wound bed only Primary Dressing: Gauze 1 x Per Day/30 Days Discharge Instructions: As directed: moistened with saline Secondary Dressing: ABD Pad 5x9 (in/in) 1 x Per Day/30 Days Discharge Instructions: Cover with ABD pad Secondary Dressing: Kerlix 4.5 x 4.1 (in/yd) 1 x Per Day/30 Days Discharge Instructions: Apply Kerlix 4.5 x 4.1 (in/yd) as instructed Secured With: Medipore Tape - 3104M Medipore H Soft Cloth Surgical Tape, 2x2 (in/yd) 1 x Per Day/30 Days Wound #3 - Ankle Wound Laterality: Right, Lateral Cleanser: Normal Saline 1 x Per Day/30 Days Discharge Instructions: Wash your hands with soap and water. Remove old dressing, discard into plastic bag and place into trash. Cleanse the wound with Normal Saline prior to applying a clean dressing using gauze sponges, not tissues or cotton balls. Do not scrub or use excessive force. Pat dry using gauze sponges, not tissue or cotton balls. Cleanser: Soap and Water 1 x Per Day/30 Days Discharge Instructions: Gently cleanse wound with antibacterial soap, rinse and pat dry prior to dressing wounds Topical: Santyl Collagenase Ointment, 30 (gm), tube 1 x Per Day/30 Days Discharge Instructions: apply nickel thick to wound bed only Primary Dressing: Gauze 1 x Per Day/30 Days Discharge Instructions: As directed: moistened with saline Secondary  Dressing: ABD Pad 5x9 (in/in) 1 x Per Day/30 Days Discharge Instructions: Cover with ABD pad Secondary Dressing: Kerlix 4.5 x 4.1 (in/yd) 1 x Per Day/30  Days Discharge Instructions: Apply Kerlix 4.5 x 4.1 (in/yd) as instructed Secured With: Medipore Tape - 45M Medipore H Soft Cloth Surgical Tape, 2x2 (in/yd) 1 x Per Day/30 Days Wound #4 - Foot Wound Laterality: Right, Lateral Cleanser: Normal Saline 1 x Per Day/30 Days Discharge Instructions: Wash your hands with soap and water. Remove old dressing, discard into plastic bag and place into trash. Cleanse the wound with Normal Saline prior to applying a clean dressing using gauze sponges, not tissues or cotton balls. Do not scrub or use excessive force. Pat dry using gauze sponges, not tissue or cotton balls. Cleanser: Soap and Water 1 x Per Day/30 Days Discharge Instructions: Gently cleanse wound with antibacterial soap, rinse and pat dry prior to dressing wounds Topical: Santyl Collagenase Ointment, 30 (gm), tube 1 x Per Day/30 Days Discharge Instructions: apply nickel thick to wound bed only Primary Dressing: Gauze 1 x Per Day/30 Days Discharge Instructions: As directed: moistened with saline Secondary Dressing: ABD Pad 5x9 (in/in) 1 x Per Day/30 Days Discharge Instructions: Cover with ABD pad Bautch, Lashaundra H. ([id number]) Secondary Dressing: Kerlix 4.5 x 4.1 (in/yd) 1 x Per Day/30 Days Discharge Instructions: Apply Kerlix 4.5 x 4.1 (in/yd) as instructed Secured With: Medipore Tape - 45M Medipore H Soft Cloth Surgical Tape, 2x2 (in/yd) 1 x Per Day/30 Days Electronic Signature(s) Signed: 11/24/2021 4:47:59 PM By: Levora Dredge Signed: 11/24/2021 4:56:25 PM By: Worthy Keeler PA-C Entered By: Levora Dredge on 11/24/2021 12:20:22 Botkins, Sativa H. ([id number]) -------------------------------------------------------------------------------- Problem List Details Patient Name: Gutridge, Madalyn H. Date of Service: 11/24/2021 11:30 AM Medical Record Number: [id number] Patient Account Number: [id number] Date of Birth/Sex: 09/18/1950 (71 y.o. F) Treating RN: Levora Dredge Primary Care Provider:  Raelene Bott Other Clinician: Referring Provider: Raelene Bott Treating Provider/Extender: Skipper Cliche in Treatment: 14 Active Problems ICD-10 Encounter Code Description Active Date MDM Diagnosis L89.620 Pressure ulcer of left heel, unstageable 08/18/2021 No Yes L89.610 Pressure ulcer of right heel, unstageable 08/18/2021 No Yes L89.510 Pressure ulcer of right ankle, unstageable 08/18/2021 No Yes L89.890 Pressure ulcer of other site, unstageable 08/18/2021 No Yes I73.89 Other specified peripheral vascular diseases 08/18/2021 No Yes E11.621 Type 2 diabetes mellitus with foot ulcer 08/18/2021 No Yes N18.31 Chronic kidney disease, stage 3a 08/18/2021 No Yes I25.10 Atherosclerotic heart disease of native coronary artery without angina 08/18/2021 No Yes pectoris I87.2 Venous insufficiency (chronic) (peripheral) 08/18/2021 No Yes I50.42 Chronic combined systolic (congestive) and diastolic (congestive) heart 08/18/2021 No Yes failure Inactive Problems Resolved Problems Electronic Signature(s) Signed: 11/24/2021 11:41:46 AM By: Worthy Keeler PA-C Entered By: Worthy Keeler on 11/24/2021 11:41:46 Rolfe, Danielly H. ([id number]) Florentino, Priyana H. ([id number]) -------------------------------------------------------------------------------- Progress Note Details Patient Name: Meter, Fred H. Date of Service: 11/24/2021 11:30 AM Medical Record Number: [id number] Patient Account Number: [id number] Date of Birth/Sex: December 11, 1950 (71 y.o. F) Treating RN: Levora Dredge Primary Care Provider: Raelene Bott Other Clinician: Referring Provider: Raelene Bott Treating Provider/Extender: Skipper Cliche in Treatment: 14 Subjective Chief Complaint Information obtained from Patient Bilateral Foot Ulcers History of Present Illness (HPI) 08/18/2021 patient presents today for reevaluation here in our clinic concerning issues she has been having with her feet bilaterally. She has multiple pressure  injuries noted at this point. She was previously seen at [facility] where she did have a work-up which included x-rays and identified osteomyelitis likely  in the fifth metatarsal base which is consistent with where she does have a wound noted at this point as well. The patient does appear to have severe peripheral vascular disease on the right with an ABI of 0.36 in the office and ABI on the left of 0.82. Obviously this is concerning for me. Even when I saw her in [place] 1 year ago January 2022 last she had very poor arterial flow at that time. She tells me that she did go for revascularization I honestly cannot find that file anywhere to show where this was done. Again I am not saying it was not done I am just not able to find that record at this point. With that being said currently they have been using the Vaseline gauze with iodine. Nonetheless I do believe as well that the patient unfortunately has significant peripheral vascular disease which has led to the current wounds that she has at this point. She does have a history of multiple pressure injuries of her feet bilaterally in the heel and lateral portion of her right foot where there is noted to be likely osteomyelitis on x-ray. She had an elevated sed rate at 44 and a C-reactive protein of 26. Again this is often consistent with inflammation which could be related to osteomyelitis as well. With that being said the patient also has a history of diabetes mellitus type 2, chronic kidney disease stage IIIa, coronary artery disease, chronic venous insufficiency, and congestive heart failure. 09-12-2021 upon evaluation today patient appears to be doing well currently in regard to her wounds all things considered. Unfortunately she has not had pretty much anything that was recommended 3 weeks ago when she was here done with the facility. She was supposed to have a evaluation with vascular that has not been scheduled as far as I can tell. She  was also supposed to have an MRI of the right foot also this has not been done as best I can tell. She was supposed to be having Betadine applied to the foot followed by dry gauze and a lightly applied roll gauze to secure in place. Again this has also not been done. Again I am not really certain what exactly is going on and why there is this delay. I discussed this with the patient today she is very frustrated as well. 5/8; 2-week follow-up. Patient had a skilled facility in [place] [facility] [place]. She has wounds on the right lateral foot, right lateral heel and right lateral ankle. Also wound on the left heel tip. We discovered a new area on the left posterior mid calf. All of these covered in significant eschar. I did not debride these. The patient did have her MRI done in [place]. This was of the right foot only. This showed multiple shallow skin ulcerations at the lateral midfoot posterior lateral heel and lateral ankle without evidence of active osteomyelitis. She had a nondisplaced fracture of the fifth metatarsal neck 10-18-2021 upon evaluation today patient appears to be doing a little better in regard to her wounds. Everything seems to be dry and stable which is great news. Fortunately I do not see any evidence of active infection locally or systemically at this time which is great news. No fevers, chills, nausea, vomiting, or diarrhea. With that being said there is some question here as to whether or not the patient actually had her vascular appointment I cannot see anything active in care everywhere. With that being said listening to her talk today it appears  that she may have seen a vascular surgeon that may have actually talked about the fact that they did not feel anything could be done without damaging her kidneys to improve the blood flow in her legs. With that being said she does not want to have an amputation which I completely understand and agree with. Especially if she is staying dry and  stable. With that being said as I explained to her today especially if we cannot improve blood flow and have no option there I am not certain if this could have anything we can do to know whether or not she will end up with an amputation which is good have to keep doing what you are doing and trying to get this healed as best we can. 11-08-2021 upon evaluation today patient appears to be doing well currently in regard to her wounds that they are starting to soften up to some degree although she still having a lot of discomfort unfortunately. Fortunately I do not see any evidence of active infection locally or systemically which is great news. No fevers, chills, nausea, vomiting, or diarrhea. 11-24-2021 upon evaluation today patient appears to be doing well currently in regard to her wounds where a lot of the necrotic tissue is softening up this is good news. Fortunately there does not appear to be any evidence of active infection locally or systemically at this time which is also excellent news. No fevers, chills, nausea, vomiting, or diarrhea. Unfortunately the patient has been in the hospital and during this time in the hospital she was treated for a congestive heart failure exacerbation with shortness of breath. She tells me that she is feeling a little bit better though she still seems to be a little bit on the lower side as far as her breathing is concerned. Her oxygen saturation is 92% today but she is a little bit more labored than what I am used to seeing out of her. Corenne, Ishimoto Lear H. ([id number]) Objective Constitutional Obese and well-hydrated in no acute distress. Vitals Time Taken: 12:00 PM, Height: 64 in, Weight: 142 lbs, BMI: 24.4, Temperature: 98.2 F, Pulse: 82 bpm, Respiratory Rate: 18 breaths/min, Blood Pressure: 123/66 mmHg, Pulse Oximetry: 92 %. General Notes: patient states recent trip to hospital for respiratory issues. Patient states somewhat better now, PA Stone made aware.  Advised patient if continues to get worse she needs to go back to hospital and should be getting daily weights Respiratory normal breathing without difficulty. Psychiatric this patient is able to make decisions and demonstrates good insight into disease process. Alert and Oriented x 3. pleasant and cooperative. General Notes: Upon inspection patient's wound bed actually showed signs of good granulation and epithelization at this point. Fortunately there does not appear to be any evidence of active infection at this time which is great news. No fevers, chills, nausea, vomiting, or diarrhea. She does have some eschar noted with the need to try to clean away some of this on the right lateral foot. She tolerated this with really no significant discomfort I was able to remove a portion of the eschar not the hole where it was loosening up. Again we will try to be very careful due to her limitations in blood flow especially on this right side is much as possible. We are using Santyl in general to soften everything up the wound to get soft treatment away what we can. Integumentary (Hair, Skin) Wound #1 status is Open. Original cause of wound was Pressure Injury. The date  acquired was: 05/22/2021. The wound has been in treatment 14 weeks. The wound is located on the Left Calcaneus. The wound measures 2.2cm length x 2cm width x 0.1cm depth; 3.456cm^2 area and 0.346cm^3 volume. There is no tunneling or undermining noted. There is a medium amount of serosanguineous drainage noted. There is no granulation within the wound bed. There is a large (67-100%) amount of necrotic tissue within the wound bed including Eschar. Wound #2 status is Open. Original cause of wound was Pressure Injury. The date acquired was: 05/22/2021. The wound has been in treatment 14 weeks. The wound is located on the Right Calcaneus. The wound measures 1.4cm length x 1.3cm width x 0.1cm depth; 1.429cm^2 area and 0.143cm^3 volume. There is Fat  Layer (Subcutaneous Tissue) exposed. There is no tunneling or undermining noted. There is a medium amount of serosanguineous drainage noted. There is no granulation within the wound bed. There is a large (67-100%) amount of necrotic tissue within the wound bed including Eschar. Wound #3 status is Open. Original cause of wound was Pressure Injury. The date acquired was: 07/21/2018. The wound has been in treatment 14 weeks. The wound is located on the Right,Lateral Ankle. The wound measures 0.5cm length x 0.5cm width x 0.1cm depth; 0.196cm^2 area and 0.02cm^3 volume. There is Fat Layer (Subcutaneous Tissue) exposed. There is no tunneling or undermining noted. There is a medium amount of serosanguineous drainage noted. There is no granulation within the wound bed. There is a large (67-100%) amount of necrotic tissue within the wound bed including Adherent Slough. Wound #4 status is Open. Original cause of wound was Pressure Injury. The date acquired was: 07/21/2018. The wound has been in treatment 14 weeks. The wound is located on the Right,Lateral Foot. The wound measures 3cm length x 3.2cm width x 0.1cm depth; 7.54cm^2 area and 0.754cm^3 volume. There is Fat Layer (Subcutaneous Tissue) exposed. There is no tunneling or undermining noted. There is a medium amount of serosanguineous drainage noted. There is no granulation within the wound bed. There is a large (67-100%) amount of necrotic tissue within the wound bed including Eschar. Assessment Active Problems ICD-10 Pressure ulcer of left heel, unstageable Pressure ulcer of right heel, unstageable Pressure ulcer of right ankle, unstageable Pressure ulcer of other site, unstageable Other specified peripheral vascular diseases Type 2 diabetes mellitus with foot ulcer Chronic kidney disease, stage 3a Atherosclerotic heart disease of native coronary artery without angina pectoris Venous insufficiency (chronic) (peripheral) Chronic combined systolic  (congestive) and diastolic (congestive) heart failure Procedures Slight, Zala H. ([id number]) Wound #4 Pre-procedure diagnosis of Wound #4 is a Pressure Ulcer located on the Right,Lateral Foot .Severity of Tissue Pre Debridement is: Fat layer exposed. There was a Selective/Open Wound Non-Viable Tissue Debridement with a total area of 4 sq cm performed by Tommie Sams., PA-C. With the following instrument(s): Forceps, and Scissors to remove Non-Viable tissue/material. Material removed includes Eschar. No specimens were taken. A time out was conducted at 12:15, prior to the start of the procedure. A Minimum amount of bleeding was controlled with Pressure. The procedure was tolerated well. Post Debridement Measurements: 3cm length x 3.2cm width x 0.1cm depth; 0.754cm^3 volume. Post debridement Stage noted as Unstageable/Unclassified. Character of Wound/Ulcer Post Debridement is stable. Severity of Tissue Post Debridement is: Fat layer exposed. Post procedure Diagnosis Wound #4: Same as Pre-Procedure Plan Follow-up Appointments: Return Appointment in 3 weeks. Nurse Visit as needed Bathing/ Shower/ Hygiene: May shower; gently cleanse wound with antibacterial soap, rinse and pat dry prior  to dressing wounds No tub bath. Anesthetic (Use 'Patient Medications' Section for Anesthetic Order Entry): Lidocaine applied to wound bed Edema Control - Lymphedema / Segmental Compressive Device / Other: DO YOUR BEST to sleep in the bed at night. DO NOT sleep in your recliner. Long hours of sitting in a recliner leads to swelling of the legs and/or potential wounds on your backside. Off-Loading: Turn and reposition every 2 hours - please keep heels off bed when laying to relieve pressure WOUND #1: - Calcaneus Wound Laterality: Left Cleanser: Normal Saline 1 x Per Day/30 Days Discharge Instructions: Wash your hands with soap and water. Remove old dressing, discard into plastic bag and place into  trash. Cleanse the wound with Normal Saline prior to applying a clean dressing using gauze sponges, not tissues or cotton balls. Do not scrub or use excessive force. Pat dry using gauze sponges, not tissue or cotton balls. Cleanser: Soap and Water 1 x Per Day/30 Days Discharge Instructions: Gently cleanse wound with antibacterial soap, rinse and pat dry prior to dressing wounds Topical: Santyl Collagenase Ointment, 30 (gm), tube 1 x Per Day/30 Days Discharge Instructions: apply nickel thick to wound bed only Primary Dressing: Gauze 1 x Per Day/30 Days Discharge Instructions: As directed: moistened with saline Secondary Dressing: ABD Pad 5x9 (in/in) 1 x Per Day/30 Days Discharge Instructions: Cover with ABD pad Secondary Dressing: Kerlix 4.5 x 4.1 (in/yd) 1 x Per Day/30 Days Discharge Instructions: Apply Kerlix 4.5 x 4.1 (in/yd) as instructed Secured With: Medipore Tape - 53M Medipore H Soft Cloth Surgical Tape, 2x2 (in/yd) 1 x Per Day/30 Days WOUND #2: - Calcaneus Wound Laterality: Right Cleanser: Normal Saline 1 x Per Day/30 Days Discharge Instructions: Wash your hands with soap and water. Remove old dressing, discard into plastic bag and place into trash. Cleanse the wound with Normal Saline prior to applying a clean dressing using gauze sponges, not tissues or cotton balls. Do not scrub or use excessive force. Pat dry using gauze sponges, not tissue or cotton balls. Cleanser: Soap and Water 1 x Per Day/30 Days Discharge Instructions: Gently cleanse wound with antibacterial soap, rinse and pat dry prior to dressing wounds Topical: Santyl Collagenase Ointment, 30 (gm), tube 1 x Per Day/30 Days Discharge Instructions: apply nickel thick to wound bed only Primary Dressing: Gauze 1 x Per Day/30 Days Discharge Instructions: As directed: moistened with saline Secondary Dressing: ABD Pad 5x9 (in/in) 1 x Per Day/30 Days Discharge Instructions: Cover with ABD pad Secondary Dressing: Kerlix 4.5 x  4.1 (in/yd) 1 x Per Day/30 Days Discharge Instructions: Apply Kerlix 4.5 x 4.1 (in/yd) as instructed Secured With: Medipore Tape - 53M Medipore H Soft Cloth Surgical Tape, 2x2 (in/yd) 1 x Per Day/30 Days WOUND #3: - Ankle Wound Laterality: Right, Lateral Cleanser: Normal Saline 1 x Per Day/30 Days Discharge Instructions: Wash your hands with soap and water. Remove old dressing, discard into plastic bag and place into trash. Cleanse the wound with Normal Saline prior to applying a clean dressing using gauze sponges, not tissues or cotton balls. Do not scrub or use excessive force. Pat dry using gauze sponges, not tissue or cotton balls. Cleanser: Soap and Water 1 x Per Day/30 Days Discharge Instructions: Gently cleanse wound with antibacterial soap, rinse and pat dry prior to dressing wounds Topical: Santyl Collagenase Ointment, 30 (gm), tube 1 x Per Day/30 Days Discharge Instructions: apply nickel thick to wound bed only Primary Dressing: Gauze 1 x Per Day/30 Days Discharge Instructions: As directed: moistened with  saline Secondary Dressing: ABD Pad 5x9 (in/in) 1 x Per Day/30 Days Discharge Instructions: Cover with ABD pad Secondary Dressing: Kerlix 4.5 x 4.1 (in/yd) 1 x Per Day/30 Days Discharge Instructions: Apply Kerlix 4.5 x 4.1 (in/yd) as instructed Mikkelsen, Arelys H. ([id number]) Secured With: Medipore Tape - 556M Medipore H Soft Cloth Surgical Tape, 2x2 (in/yd) 1 x Per Day/30 Days WOUND #4: - Foot Wound Laterality: Right, Lateral Cleanser: Normal Saline 1 x Per Day/30 Days Discharge Instructions: Wash your hands with soap and water. Remove old dressing, discard into plastic bag and place into trash. Cleanse the wound with Normal Saline prior to applying a clean dressing using gauze sponges, not tissues or cotton balls. Do not scrub or use excessive force. Pat dry using gauze sponges, not tissue or cotton balls. Cleanser: Soap and Water 1 x Per Day/30 Days Discharge Instructions: Gently  cleanse wound with antibacterial soap, rinse and pat dry prior to dressing wounds Topical: Santyl Collagenase Ointment, 30 (gm), tube 1 x Per Day/30 Days Discharge Instructions: apply nickel thick to wound bed only Primary Dressing: Gauze 1 x Per Day/30 Days Discharge Instructions: As directed: moistened with saline Secondary Dressing: ABD Pad 5x9 (in/in) 1 x Per Day/30 Days Discharge Instructions: Cover with ABD pad Secondary Dressing: Kerlix 4.5 x 4.1 (in/yd) 1 x Per Day/30 Days Discharge Instructions: Apply Kerlix 4.5 x 4.1 (in/yd) as instructed Secured With: Medipore Tape - 556M Medipore H Soft Cloth Surgical Tape, 2x2 (in/yd) 1 x Per Day/30 Days 1. I would recommend currently that we go ahead and continue with the recommendation for Santyl across the board I think that still can be the best way to go. 2. I am also can recommend that we have the patient continue to monitor for any signs of worsening or infection obviously if anything changes she should let me know otherwise my plan is to continue to monitor for any signs of infection and we will see where things go as the Santyl continues to loosen up a lot of the necrotic eschar. 3. I did advise the patient that if she notices anything worsening in general she should contact the office and let me know or have the facility do so and if she is having additional issues with her breathing she needs to discuss this with the provider at the skilled nursing facility. We will see patient back for reevaluation in 3 weeks here in the clinic. If anything worsens or changes patient will contact our office for additional recommendations. Electronic Signature(s) Signed: 11/24/2021 1:22:34 PM By: Lenda KelpStone III, Shamirah Ivan PA-C Entered By: Lenda KelpStone III, Harvey Lingo on 11/24/2021 13:22:34 Crocket, Sharnise Rexene EdisonH. ([id number]) -------------------------------------------------------------------------------- SuperBill Details Patient Name: Hyacinth MeekerMILLER, Nazirah H. Date of Service:  11/24/2021 Medical Record Number: [id number] Patient Account Number: [id number] Date of Birth/Sex: 11/23/1950 (71 y.o. F) Treating RN: Angelina PihGordon, Caitlin Primary Care Provider: Lindwood QuaHoffman, Byron Other Clinician: Referring Provider: Lindwood QuaHoffman, Byron Treating Provider/Extender: Rowan BlaseStone, Ovila Lepage Weeks in Treatment: 14 Diagnosis Coding ICD-10 Codes Code Description [phone] Pressure ulcer of left heel, unstageable L89.610 Pressure ulcer of right heel, unstageable L89.510 Pressure ulcer of right ankle, unstageable L89.890 Pressure ulcer of other site, unstageable I73.89 Other specified peripheral vascular diseases E11.621 Type 2 diabetes mellitus with foot ulcer N18.31 Chronic kidney disease, stage 3a I25.10 Atherosclerotic heart disease of native coronary artery without angina pectoris I87.2 Venous insufficiency (chronic) (peripheral) I50.42 Chronic combined systolic (congestive) and diastolic (congestive) heart failure Facility Procedures CPT4 Code: [id number] Description: [id number] - DEBRIDE WOUND 1ST 20 SQ CM OR < Modifier: Quantity:  1 CPT4 Code: Description: ICD-10 Diagnosis Description L89.890 Pressure ulcer of other site, unstageable Modifier: Quantity: Physician Procedures CPT4 Code: [id number] Description: [id number] - WC PHYS DEBR WO ANESTH 20 SQ CM Modifier: Quantity: 1 CPT4 Code: Description: ICD-10 Diagnosis Description L89.890 Pressure ulcer of other site, unstageable Modifier: Quantity: Electronic Signature(s) Signed: 11/24/2021 1:30:15 PM By: Levora Dredge Signed: 11/24/2021 4:56:25 PM By: Worthy Keeler PA-C Previous Signature: 11/24/2021 1:23:04 PM Version By: Worthy Keeler PA-C Entered By: Levora Dredge on 11/24/2021 13:30:14

## 2021-12-15 ENCOUNTER — Ambulatory Visit: Payer: Medicare Other | Admitting: Internal Medicine

## 2024-05-09 ENCOUNTER — Other Ambulatory Visit: Payer: Self-pay
